# Patient Record
Sex: Female | Born: 1990 | Race: White | Hispanic: No | Marital: Single | Smoking: Current every day smoker
Health system: Southern US, Community
[De-identification: ages and names within clinical notes are randomized; demographics above are authoritative.]

## PROBLEM LIST (undated history)

## (undated) ENCOUNTER — Inpatient Hospital Stay (HOSPITAL_COMMUNITY): Payer: Self-pay

## (undated) DIAGNOSIS — N12 Tubulo-interstitial nephritis, not specified as acute or chronic: Secondary | ICD-10-CM

## (undated) DIAGNOSIS — Z87442 Personal history of urinary calculi: Secondary | ICD-10-CM

## (undated) DIAGNOSIS — N200 Calculus of kidney: Secondary | ICD-10-CM

## (undated) DIAGNOSIS — F329 Major depressive disorder, single episode, unspecified: Secondary | ICD-10-CM

## (undated) DIAGNOSIS — F32A Depression, unspecified: Secondary | ICD-10-CM

## (undated) DIAGNOSIS — F419 Anxiety disorder, unspecified: Secondary | ICD-10-CM

## (undated) DIAGNOSIS — T50901A Poisoning by unspecified drugs, medicaments and biological substances, accidental (unintentional), initial encounter: Secondary | ICD-10-CM

## (undated) HISTORY — PX: STENT PLACEMENT NON-VASCULAR (ARMC HX): HXRAD1736

## (undated) HISTORY — DX: Anxiety disorder, unspecified: F41.9

---
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
## 2010-05-31 ENCOUNTER — Inpatient Hospital Stay (HOSPITAL_COMMUNITY)
Admission: AD | Admit: 2010-05-31 | Discharge: 2010-05-31 | Disposition: A | Discharge status: Home / Self Care | Payer: 59 | Source: Ambulatory Visit | Attending: Obstetrics & Gynecology | Admitting: Obstetrics & Gynecology

## 2010-05-31 DIAGNOSIS — O4700 False labor before 37 completed weeks of gestation, unspecified trimester: Secondary | ICD-10-CM | POA: Insufficient documentation

## 2010-06-01 LAB — GC/CHLAMYDIA PROBE AMP, GENITAL: GC Probe Amp, Genital: NEGATIVE

## 2010-06-18 LAB — URINALYSIS, ROUTINE W REFLEX MICROSCOPIC
Glucose, UA: NEGATIVE mg/dL
Ketones, ur: 15 mg/dL — AB
Specific Gravity, Urine: 1.028 (ref 1.005–1.030)
pH: 5.5 (ref 5.0–8.0)

## 2010-06-18 LAB — URINE MICROSCOPIC-ADD ON

## 2010-06-18 LAB — GC/CHLAMYDIA PROBE AMP, GENITAL
Chlamydia, DNA Probe: NEGATIVE
GC Probe Amp, Genital: NEGATIVE

## 2010-06-18 LAB — URINE CULTURE: Culture: NO GROWTH

## 2010-06-18 LAB — WET PREP, GENITAL

## 2010-06-27 ENCOUNTER — Inpatient Hospital Stay (HOSPITAL_COMMUNITY)
Admission: AD | Admit: 2010-06-27 | Discharge: 2010-06-30 | DRG: 775 | Disposition: A | Discharge status: Home / Self Care | Payer: 59 | Source: Ambulatory Visit | Attending: Obstetrics & Gynecology | Admitting: Obstetrics & Gynecology

## 2010-06-27 LAB — CBC
Platelets: 136 10*3/uL — ABNORMAL LOW (ref 150–400)
RBC: 4.09 MIL/uL (ref 3.87–5.11)
RDW: 13.2 % (ref 11.5–15.5)

## 2010-06-27 LAB — COMPREHENSIVE METABOLIC PANEL
ALT: 31 U/L (ref 0–35)
Alkaline Phosphatase: 195 U/L — ABNORMAL HIGH (ref 39–117)
Calcium: 9.5 mg/dL (ref 8.4–10.5)
Chloride: 105 mEq/L (ref 96–112)
Total Bilirubin: 0.4 mg/dL (ref 0.3–1.2)

## 2010-06-27 LAB — URIC ACID: Uric Acid, Serum: 8.3 mg/dL — ABNORMAL HIGH (ref 2.4–7.0)

## 2010-06-28 LAB — RPR: RPR Ser Ql: NONREACTIVE

## 2010-06-28 LAB — ABO/RH: ABO/RH(D): O POS

## 2010-06-29 LAB — CBC
HCT: 34.3 % — ABNORMAL LOW (ref 36.0–46.0)
Hemoglobin: 11.3 g/dL — ABNORMAL LOW (ref 12.0–15.0)
MCHC: 32.9 g/dL (ref 30.0–36.0)
MCV: 94.5 fL (ref 78.0–100.0)
WBC: 12.2 10*3/uL — ABNORMAL HIGH (ref 4.0–10.5)

## 2013-04-08 ENCOUNTER — Encounter: Payer: Self-pay | Admitting: Emergency Medicine

## 2013-04-08 ENCOUNTER — Ambulatory Visit (INDEPENDENT_AMBULATORY_CARE_PROVIDER_SITE_OTHER): Payer: 59 | Admitting: Emergency Medicine

## 2013-04-08 VITALS — BP 112/82 | HR 118 | Temp 97.8°F | Resp 16 | Ht 62.5 in | Wt 102.0 lb

## 2013-04-08 DIAGNOSIS — R5383 Other fatigue: Principal | ICD-10-CM

## 2013-04-08 DIAGNOSIS — Z113 Encounter for screening for infections with a predominantly sexual mode of transmission: Secondary | ICD-10-CM

## 2013-04-08 DIAGNOSIS — N912 Amenorrhea, unspecified: Secondary | ICD-10-CM

## 2013-04-08 DIAGNOSIS — R5381 Other malaise: Secondary | ICD-10-CM

## 2013-04-08 DIAGNOSIS — R350 Frequency of micturition: Secondary | ICD-10-CM

## 2013-04-08 LAB — BASIC METABOLIC PANEL WITH GFR
BUN: 12 mg/dL (ref 6–23)
CALCIUM: 10 mg/dL (ref 8.4–10.5)
CO2: 26 meq/L (ref 19–32)
CREATININE: 0.86 mg/dL (ref 0.50–1.10)
Chloride: 102 mEq/L (ref 96–112)
Glucose, Bld: 69 mg/dL — ABNORMAL LOW (ref 70–99)
POTASSIUM: 3.8 meq/L (ref 3.5–5.3)
Sodium: 138 mEq/L (ref 135–145)

## 2013-04-08 LAB — CBC WITH DIFFERENTIAL/PLATELET
BASOS ABS: 0 10*3/uL (ref 0.0–0.1)
BASOS PCT: 1 % (ref 0–1)
EOS ABS: 0.1 10*3/uL (ref 0.0–0.7)
EOS PCT: 2 % (ref 0–5)
HEMATOCRIT: 41.7 % (ref 36.0–46.0)
Hemoglobin: 13.8 g/dL (ref 12.0–15.0)
LYMPHS ABS: 1.7 10*3/uL (ref 0.7–4.0)
LYMPHS PCT: 34 % (ref 12–46)
MCH: 29.9 pg (ref 26.0–34.0)
MCHC: 33.1 g/dL (ref 30.0–36.0)
MCV: 90.3 fL (ref 78.0–100.0)
MONO ABS: 0.4 10*3/uL (ref 0.1–1.0)
Monocytes Relative: 8 % (ref 3–12)
NEUTROS ABS: 2.7 10*3/uL (ref 1.7–7.7)
NEUTROS PCT: 55 % (ref 43–77)
PLATELETS: 180 10*3/uL (ref 150–400)
RBC: 4.62 MIL/uL (ref 3.87–5.11)
RDW: 13 % (ref 11.5–15.5)
WBC: 4.9 10*3/uL (ref 4.0–10.5)

## 2013-04-08 LAB — HEPATIC FUNCTION PANEL
ALBUMIN: 4.7 g/dL (ref 3.5–5.2)
ALK PHOS: 76 U/L (ref 39–117)
ALT: 12 U/L (ref 0–35)
AST: 14 U/L (ref 0–37)
BILIRUBIN INDIRECT: 0.4 mg/dL (ref 0.0–0.9)
Bilirubin, Direct: 0.1 mg/dL (ref 0.0–0.3)
Total Bilirubin: 0.5 mg/dL (ref 0.3–1.2)
Total Protein: 8.1 g/dL (ref 6.0–8.3)

## 2013-04-08 LAB — HIV ANTIBODY (ROUTINE TESTING W REFLEX): HIV: NONREACTIVE

## 2013-04-08 LAB — RPR

## 2013-04-08 LAB — HEPATITIS PANEL, ACUTE
HCV Ab: NEGATIVE
HEP B S AG: NEGATIVE
Hep A IgM: NONREACTIVE
Hep B C IgM: NONREACTIVE

## 2013-04-08 LAB — TSH: TSH: 1.929 u[IU]/mL (ref 0.350–4.500)

## 2013-04-08 LAB — MAGNESIUM: Magnesium: 2 mg/dL (ref 1.5–2.5)

## 2013-04-08 LAB — POCT URINE PREGNANCY: PREG TEST UR: NEGATIVE

## 2013-04-08 MED ORDER — CIPROFLOXACIN HCL 500 MG PO TABS: 500.0000 mg | ORAL_TABLET | Freq: Two times a day (BID) | ORAL | Status: AC

## 2013-04-08 NOTE — Patient Instructions (Signed)
Urinary Tract Infection °A urinary tract infection (UTI) can occur any place along the urinary tract. The tract includes the kidneys, ureters, bladder, and urethra. A type of germ called bacteria often causes a UTI. UTIs are often helped with antibiotic medicine.  °HOME CARE  °· If given, take antibiotics as told by your doctor. Finish them even if you start to feel better. °· Drink enough fluids to keep your pee (urine) clear or pale yellow. °· Avoid tea, drinks with caffeine, and bubbly (carbonated) drinks. °· Pee often. Avoid holding your pee in for a long time. °· Pee before and after having sex (intercourse). °· Wipe from front to back after you poop (bowel movement) if you are a woman. Use each tissue only once. °GET HELP RIGHT AWAY IF:  °· You have back pain. °· You have lower belly (abdominal) pain. °· You have chills. °· You feel sick to your stomach (nauseous). °· You throw up (vomit). °· Your burning or discomfort with peeing does not go away. °· You have a fever. °· Your symptoms are not better in 3 days. °MAKE SURE YOU:  °· Understand these instructions. °· Will watch your condition. °· Will get help right away if you are not doing well or get worse. °Document Released: 09/04/2007 Document Revised: 12/11/2011 Document Reviewed: 10/17/2011 °Exit[employer]® Patient Information ©2014 [employer]. ° °

## 2013-04-08 NOTE — Progress Notes (Signed)
   Subjective:    Patient ID: Patty Mata, female    DOB: 03/18/1991, 23 y.o.   MRN: [id number]  HPI Comments: 23 yo female has noticed unusual discharge in vaginal area on/ off for "a while". She thinks her last period was in April, she denies chance of pregnancy. She is not on birth control. She has noticed increased frequency with urine. Denies hematuria/ dysuria. She is not eating healthy or exercising routinely. She wants to get tested for STDs. She has had increased fatigue.  Current Outpatient Prescriptions on File Prior to Visit  Medication Sig Dispense Refill  . BIOTIN PO Take by mouth daily.      . medroxyPROGESTERone (DEPO-PROVERA) 150 MG/ML injection Inject 150 mg into the muscle every 3 (three) months.       No current facility-administered medications on file prior to visit.   ALLERGIES Penicillins  PMH Negative  Review of Systems  Constitutional: Positive for fatigue.  Genitourinary: Positive for vaginal discharge and menstrual problem.  All other systems reviewed and are negative.   BP 112/82  Pulse 118  Temp(Src) 97.8 F (36.6 C) (Temporal)  Resp 16  Ht 5' 2.5" (1.588 m)  Wt 102 lb (46.267 kg)  BMI 18.35 kg/m2     Objective:   Physical Exam  Nursing note and vitals reviewed. Constitutional: She is oriented to person, place, and time. She appears well-developed and well-nourished. No distress.  HENT:  Head: Normocephalic and atraumatic.  Right Ear: External ear normal.  Left Ear: External ear normal.  Nose: Nose normal.  Mouth/Throat: Oropharynx is clear and moist.  Eyes: Conjunctivae and EOM are normal.  Neck: Normal range of motion. Neck supple. No JVD present. No thyromegaly present.  Cardiovascular: Normal rate, regular rhythm, normal heart sounds and intact distal pulses.   Pulmonary/Chest: Effort normal and breath sounds normal.  Abdominal: Soft. Bowel sounds are normal. She exhibits no distension and no mass. There is no tenderness. There is no  rebound and no guarding.  Genitourinary:  Def to GYN  Musculoskeletal: Normal range of motion. She exhibits no edema and no tenderness.  Lymphadenopathy:    She has no cervical adenopathy.  Neurological: She is alert and oriented to person, place, and time. No cranial nerve deficit.  Skin: Skin is warm and dry. No rash noted. No erythema. No pallor.  Psychiatric: Her behavior is normal. Judgment and thought content normal.  Flat          Assessment & Plan:  1. Fatigue- check labs, increase activity and H2O 2. Urine frequency-Check labs, Hygiene explained, Cipro 500 mg AD 3. Amenorrhea/ std screening- CHeck labs, Advised she weill need full eval at GYN with Amenorrhea.

## 2013-04-09 LAB — URINALYSIS, MICROSCOPIC ONLY: Casts: NONE SEEN

## 2013-04-09 LAB — URINALYSIS, ROUTINE W REFLEX MICROSCOPIC
Glucose, UA: NEGATIVE mg/dL
HGB URINE DIPSTICK: NEGATIVE
KETONES UR: NEGATIVE mg/dL
NITRITE: NEGATIVE
PROTEIN: NEGATIVE mg/dL
SPECIFIC GRAVITY, URINE: 1.022 (ref 1.005–1.030)
UROBILINOGEN UA: 0.2 mg/dL (ref 0.0–1.0)
pH: 5.5 (ref 5.0–8.0)

## 2013-04-09 LAB — HSV(HERPES SIMPLEX VRS) I + II AB-IGG
HSV 1 Glycoprotein G Ab, IgG: 0.26 IV
HSV 2 GLYCOPROTEIN G AB, IGG: 0.12 IV

## 2013-04-09 LAB — VITAMIN D 25 HYDROXY (VIT D DEFICIENCY, FRACTURES): Vit D, 25-Hydroxy: 43 ng/mL (ref 30–89)

## 2013-04-09 LAB — URINE CULTURE
Colony Count: NO GROWTH
Organism ID, Bacteria: NO GROWTH

## 2013-04-09 LAB — GC PROBE AMPLIFICATION, URINE: GC PROBE AMP, URINE: NEGATIVE

## 2013-04-10 ENCOUNTER — Encounter: Payer: Self-pay | Admitting: Emergency Medicine

## 2013-04-12 ENCOUNTER — Telehealth: Payer: Self-pay | Admitting: *Deleted

## 2013-04-12 NOTE — Telephone Encounter (Signed)
Message copied by Laurence SpatesHELMER, Jasleen Riepe A on Mon Apr 12, 2013 12:32 PM ------      Message from: Berenice PrimasSMITH, MELISSA R      Created: Sat Apr 10, 2013  7:27 PM       All labs neg. But ? UTI with SX continue cipro AD. Advise she still needs GYN eval ------

## 2013-04-12 NOTE — Telephone Encounter (Signed)
Spoke with patient about recent lab results and instructions. 

## 2013-05-03 ENCOUNTER — Other Ambulatory Visit: Payer: Self-pay | Admitting: Emergency Medicine

## 2013-05-03 DIAGNOSIS — N390 Urinary tract infection, site not specified: Secondary | ICD-10-CM

## 2013-05-04 ENCOUNTER — Other Ambulatory Visit: Payer: Self-pay

## 2013-06-04 ENCOUNTER — Emergency Department (HOSPITAL_COMMUNITY): Payer: 59

## 2013-06-04 ENCOUNTER — Encounter (HOSPITAL_COMMUNITY): Payer: Self-pay | Admitting: Emergency Medicine

## 2013-06-04 ENCOUNTER — Emergency Department (HOSPITAL_COMMUNITY)
Admission: EM | Admit: 2013-06-04 | Discharge: 2013-06-04 | Disposition: A | Discharge status: Home / Self Care | Payer: 59 | Attending: Emergency Medicine | Admitting: Emergency Medicine

## 2013-06-04 DIAGNOSIS — R102 Pelvic and perineal pain: Secondary | ICD-10-CM

## 2013-06-04 DIAGNOSIS — F17200 Nicotine dependence, unspecified, uncomplicated: Secondary | ICD-10-CM | POA: Insufficient documentation

## 2013-06-04 DIAGNOSIS — B3731 Acute candidiasis of vulva and vagina: Secondary | ICD-10-CM | POA: Insufficient documentation

## 2013-06-04 DIAGNOSIS — K625 Hemorrhage of anus and rectum: Secondary | ICD-10-CM | POA: Insufficient documentation

## 2013-06-04 DIAGNOSIS — Z880 Allergy status to penicillin: Secondary | ICD-10-CM | POA: Insufficient documentation

## 2013-06-04 DIAGNOSIS — B373 Candidiasis of vulva and vagina: Secondary | ICD-10-CM

## 2013-06-04 LAB — WET PREP, GENITAL
CLUE CELLS WET PREP: NONE SEEN
Trich, Wet Prep: NONE SEEN

## 2013-06-04 LAB — URINALYSIS, ROUTINE W REFLEX MICROSCOPIC
BILIRUBIN URINE: NEGATIVE
Glucose, UA: NEGATIVE mg/dL
HGB URINE DIPSTICK: NEGATIVE
KETONES UR: NEGATIVE mg/dL
Leukocytes, UA: NEGATIVE
Nitrite: NEGATIVE
PH: 7 (ref 5.0–8.0)
Protein, ur: NEGATIVE mg/dL
SPECIFIC GRAVITY, URINE: 1.023 (ref 1.005–1.030)
Urobilinogen, UA: 1 mg/dL (ref 0.0–1.0)

## 2013-06-04 LAB — COMPREHENSIVE METABOLIC PANEL
ALK PHOS: 71 U/L (ref 39–117)
ALT: 12 U/L (ref 0–35)
AST: 14 U/L (ref 0–37)
Albumin: 4 g/dL (ref 3.5–5.2)
BUN: 14 mg/dL (ref 6–23)
CALCIUM: 9.5 mg/dL (ref 8.4–10.5)
CO2: 28 mEq/L (ref 19–32)
Chloride: 103 mEq/L (ref 96–112)
Creatinine, Ser: 0.88 mg/dL (ref 0.50–1.10)
GFR calc Af Amer: >90 mL/min (ref >90)
GFR calc non Af Amer: >90 mL/min (ref >90)
GLUCOSE: 72 mg/dL (ref 70–99)
POTASSIUM: 4 meq/L (ref 3.7–5.3)
SODIUM: 141 meq/L (ref 137–147)
Total Bilirubin: 0.5 mg/dL (ref 0.3–1.2)
Total Protein: 6.9 g/dL (ref 6.0–8.3)

## 2013-06-04 LAB — CBC WITH DIFFERENTIAL/PLATELET
Basophils Absolute: 0.1 10*3/uL (ref 0.0–0.1)
Basophils Relative: 1 % (ref 0–1)
EOS PCT: 1 % (ref 0–5)
Eosinophils Absolute: 0.1 10*3/uL (ref 0.0–0.7)
HEMATOCRIT: 40 % (ref 36.0–46.0)
HEMOGLOBIN: 13.2 g/dL (ref 12.0–15.0)
LYMPHS ABS: 2.4 10*3/uL (ref 0.7–4.0)
LYMPHS PCT: 36 % (ref 12–46)
MCH: 30.4 pg (ref 26.0–34.0)
MCHC: 33 g/dL (ref 30.0–36.0)
MCV: 92.2 fL (ref 78.0–100.0)
MONOS PCT: 8 % (ref 3–12)
Monocytes Absolute: 0.6 10*3/uL (ref 0.1–1.0)
Neutro Abs: 3.5 10*3/uL (ref 1.7–7.7)
Neutrophils Relative %: 53 % (ref 43–77)
Platelets: 160 10*3/uL (ref 150–400)
RBC: 4.34 MIL/uL (ref 3.87–5.11)
RDW: 12.8 % (ref 11.5–15.5)
WBC: 6.5 10*3/uL (ref 4.0–10.5)

## 2013-06-04 MED ORDER — OXYCODONE-ACETAMINOPHEN 5-325 MG PO TABS: 2.0000 | ORAL_TABLET | ORAL | Status: DC | PRN

## 2013-06-04 MED ORDER — FLUCONAZOLE 150 MG PO TABS
150.0000 mg | ORAL_TABLET | Freq: Once | ORAL | Status: AC
  Administered 2013-06-04: 150 mg via ORAL
  Filled 2013-06-04: qty 1

## 2013-06-04 MED ORDER — ONDANSETRON 8 MG PO TBDP
8.0000 mg | ORAL_TABLET | Freq: Once | ORAL | Status: AC
  Administered 2013-06-04: 8 mg via ORAL
  Filled 2013-06-04: qty 1

## 2013-06-04 MED ORDER — OXYCODONE-ACETAMINOPHEN 5-325 MG PO TABS
2.0000 | ORAL_TABLET | Freq: Once | ORAL | Status: AC
  Administered 2013-06-04: 2 via ORAL
  Filled 2013-06-04: qty 2

## 2013-06-04 NOTE — ED Notes (Signed)
Pt unable to urinate at this time. She is aware that we need a urine sample

## 2013-06-04 NOTE — ED Notes (Signed)
Pt has had pelvic performed by Dr Allen and wiFreida Busmantnessed by Woodlawn Parkarmen NT

## 2013-06-04 NOTE — ED Notes (Signed)
Pt. Made aware for the need of urine. 

## 2013-06-04 NOTE — ED Provider Notes (Signed)
CSN: [id number]     Arrival date & time 06/04/13  1800 History   First MD Initiated Contact with Patient 06/04/13 2035     Chief Complaint  Patient presents with  . Abdominal Pain  . Rectal Bleeding     (Consider location/radiation/quality/duration/timing/severity/associated sxs/prior Treatment) Patient is a 23 y.o. female presenting with abdominal pain and hematochezia. The history is provided by the patient.  Abdominal Pain Associated symptoms: hematochezia   Rectal Bleeding Associated symptoms: abdominal pain    patient here complaining of greater than 6 month history of left lower quadrant pain that became worse over the past 24 hours. Went to her GYN doctor and has followup scheduled for Monday but due to the increased sharp left lower quadrant pain she was told to come here for further evaluation. Patient denies any vaginal bleeding or discharge. Denies any dysuria or hematuria. No fever or chills. Symptoms worse with movement and better with rest. No treatment used prior to arrival. Pain has been constant for the past 6 months  History reviewed. No pertinent past medical history. History reviewed. No pertinent past surgical history. Family History  Problem Relation Age of Onset  . Depression Mother   . Arthritis Mother   . Asthma Father   . Mental illness Father    History  Substance Use Topics  . Smoking status: Current Every Day Smoker -- 0.50 packs/day for 5 years    Types: Cigarettes  . Smokeless tobacco: Never Used  . Alcohol Use: Yes     Comment: Socially   OB History   Grav Para Term Preterm Abortions TAB SAB Ect Mult Living                 Review of Systems  Gastrointestinal: Positive for abdominal pain and hematochezia.  All other systems reviewed and are negative.      Allergies  Penicillins  Home Medications   Current Outpatient Rx  Name  Route  Sig  Dispense  Refill  . acetaminophen (TYLENOL) 500 MG tablet   Oral   Take 1,000 mg by mouth  every 6 (six) hours as needed for mild pain.         Marland Kitchen ibuprofen (ADVIL,MOTRIN) 200 MG tablet   Oral   Take 800 mg by mouth every 6 (six) hours as needed for moderate pain.          BP 107/74  Pulse 106  Temp(Src) 97.7 F (36.5 C) (Oral)  Resp 16  SpO2 100% Physical Exam  Nursing note and vitals reviewed. Constitutional: She is oriented to person, place, and time. She appears well-developed and well-nourished.  Non-toxic appearance. No distress.  HENT:  Head: Normocephalic and atraumatic.  Eyes: Conjunctivae, EOM and lids are normal. Pupils are equal, round, and reactive to light.  Neck: Normal range of motion. Neck supple. No tracheal deviation present. No mass present.  Cardiovascular: Normal rate, regular rhythm and normal heart sounds.  Exam reveals no gallop.   No murmur heard. Pulmonary/Chest: Effort normal and breath sounds normal. No stridor. No respiratory distress. She has no decreased breath sounds. She has no wheezes. She has no rhonchi. She has no rales.  Abdominal: Soft. Normal appearance and bowel sounds are normal. She exhibits no distension. There is tenderness in the left lower quadrant. There is no rigidity, no rebound, no guarding and no CVA tenderness.    Genitourinary: Cervix exhibits no discharge and no friability. Left adnexum displays tenderness.  Musculoskeletal: Normal range of motion. She exhibits  no edema and no tenderness.  Neurological: She is alert and oriented to person, place, and time. She has normal strength. No cranial nerve deficit or sensory deficit. GCS eye subscore is 4. GCS verbal subscore is 5. GCS motor subscore is 6.  Skin: Skin is warm and dry. No abrasion and no rash noted.  Psychiatric: She has a normal mood and affect. Her speech is normal and behavior is normal.    ED Course  Procedures (including critical care time) Labs Review Labs Reviewed  WET PREP, GENITAL  GC/CHLAMYDIA PROBE AMP  CBC WITH DIFFERENTIAL  COMPREHENSIVE  METABOLIC PANEL  URINALYSIS, ROUTINE W REFLEX MICROSCOPIC  HIV ANTIBODY (ROUTINE TESTING)  POC URINE PREG, ED   Imaging Review No results found.   EKG Interpretation None      MDM   Final diagnoses:  None    Patient given Percocet for pain we'll be treated for her yeast infection    Toy BakerAnthony T Tian Davison, MD 06/04/13 2310

## 2013-06-04 NOTE — ED Notes (Addendum)
Pt reports left lower abdominal/pelvic pain which radiates towards her back, which began a few months ago. Pt reports nausea, emesis, diarrhea, and rectal bleeding. The patient states she has seen blood in the toilet and on toilet tissue paper. Pt reports being seen at her PCP earlier today, however states she can not tolerate the pain. Pt reports not having a period since 2012, she reports taking her last injection in February 2014. Pt reports feeling a mass in her left lower abdominal/pelvic area. Pt is A/O x4.

## 2013-06-04 NOTE — ED Notes (Signed)
Pelvic cart at bedside. 

## 2013-06-04 NOTE — Discharge Instructions (Signed)
Candidal Vulvovaginitis Candidal vulvovaginitis is an infection of the vagina and vulva. The vulva is the skin around the opening of the vagina. This may cause itching and discomfort in and around the vagina.  HOME CARE  Only take medicine as told by your doctor.  Do not have sex (intercourse) until the infection is healed or as told by your doctor.  Practice safe sex.  Tell your sex partner about your infection.  Do not douche or use tampons.  Wear cotton underwear. Do not wear tight pants or panty hose.  Eat yogurt. This may help treat and prevent yeast infections. GET HELP RIGHT AWAY IF:   You have a fever.  Your problems get worse during treatment or do not get better in 3 days.  You have discomfort, irritation, or itching in your vagina or vulva area.  You have pain after sex.  You start to get belly (abdominal) pain. MAKE SURE YOU:  Understand these instructions.  Will watch your condition.  Will get help right away if you are not doing well or get worse. Document Released: 06/14/2008 Document Revised: 06/10/2011 Document Reviewed: 06/14/2008 [facility] Patient Information 2014 [place], [place]. Pelvic Pain, Female Female pelvic pain can be caused by many different things and start from a variety of places. Pelvic pain refers to pain that is located in the lower half of the abdomen and between your hips. The pain may occur over a short period of time (acute) or may be reoccurring (chronic). The cause of pelvic pain may be related to disorders affecting the female reproductive organs (gynecologic), but it may also be related to the bladder, kidney stones, an intestinal complication, or muscle or skeletal problems. Getting help right away for pelvic pain is important, especially if there has been severe, sharp, or a sudden onset of unusual pain. It is also important to get help right away because some types of pelvic pain can be life threatening.  CAUSES  Below are only some  of the causes of pelvic pain. The causes of pelvic pain can be in one of several categories.   Gynecologic.  Pelvic inflammatory disease.  Sexually transmitted infection.  Ovarian cyst or a twisted ovarian ligament (ovarian torsion).  Uterine lining that grows outside the uterus (endometriosis).  Fibroids, cysts, or tumors.  Ovulation.  Pregnancy.  Pregnancy that occurs outside the uterus (ectopic pregnancy).  Miscarriage.  Labor.  Abruption of the placenta or ruptured uterus.  Infection.  Uterine infection (endometritis).  Bladder infection.  Diverticulitis.  Miscarriage related to a uterine infection (septic abortion).  Bladder.  Inflammation of the bladder (cystitis).  Kidney stone(s).  Gastrointenstinal.  Constipation.  Diverticulitis.  Neurologic.  Trauma.  Feeling pelvic pain because of mental or emotional causes (psychosomatic).  Cancers of the bowel or pelvis. EVALUATION  Your caregiver will want to take a careful history of your concerns. This includes recent changes in your health, a careful gynecologic history of your periods (menses), and a sexual history. Obtaining your family history and medical history is also important. Your caregiver may suggest a pelvic exam. A pelvic exam will help identify the location and severity of the pain. It also helps in the evaluation of which organ system may be involved. In order to identify the cause of the pelvic pain and be properly treated, your caregiver may order tests. These tests may include:   A pregnancy test.  Pelvic ultrasonography.  An X-ray exam of the abdomen.  A urinalysis or evaluation of vaginal discharge.  Blood tests. HOME CARE INSTRUCTIONS   Only take over-the-counter or prescription medicines for pain, discomfort, or fever as directed by your caregiver.   Rest as directed by your caregiver.   Eat a balanced diet.   Drink enough fluids to make your urine clear or pale  yellow, or as directed.   Avoid sexual intercourse if it causes pain.   Apply warm or cold compresses to the lower abdomen depending on which one helps the pain.   Avoid stressful situations.   Keep a journal of your pelvic pain. Write down when it started, where the pain is located, and if there are things that seem to be associated with the pain, such as food or your menstrual cycle.  Follow up with your caregiver as directed.  SEEK MEDICAL CARE IF:  Your medicine does not help your pain.  You have abnormal vaginal discharge. SEEK IMMEDIATE MEDICAL CARE IF:   You have heavy bleeding from the vagina.   Your pelvic pain increases.   You feel lightheaded or faint.   You have chills.   You have pain with urination or blood in your urine.   You have uncontrolled diarrhea or vomiting.   You have a fever or persistent symptoms for more than 3 days.  You have a fever and your symptoms suddenly get worse.   You are being physically or sexually abused.  MAKE SURE YOU:  Understand these instructions.  Will watch your condition.  Will get help if you are not doing well or get worse. Document Released: 02/13/2004 Document Revised: 09/17/2011 Document Reviewed: 07/08/2011 [facility] Patient Information 2014 [place].

## 2013-06-05 LAB — HIV ANTIBODY (ROUTINE TESTING W REFLEX): HIV: NONREACTIVE

## 2013-06-06 LAB — GC/CHLAMYDIA PROBE AMP
CT PROBE, AMP APTIMA: NEGATIVE
GC PROBE AMP APTIMA: NEGATIVE

## 2013-06-07 ENCOUNTER — Other Ambulatory Visit: Payer: Self-pay | Admitting: Plastic Surgery

## 2013-06-17 ENCOUNTER — Emergency Department (HOSPITAL_COMMUNITY): Payer: 59

## 2013-06-17 ENCOUNTER — Encounter (HOSPITAL_COMMUNITY): Payer: Self-pay | Admitting: Emergency Medicine

## 2013-06-17 ENCOUNTER — Emergency Department (HOSPITAL_COMMUNITY)
Admission: EM | Admit: 2013-06-17 | Discharge: 2013-06-17 | Disposition: A | Discharge status: Home / Self Care | Payer: 59 | Attending: Emergency Medicine | Admitting: Emergency Medicine

## 2013-06-17 DIAGNOSIS — F17200 Nicotine dependence, unspecified, uncomplicated: Secondary | ICD-10-CM | POA: Insufficient documentation

## 2013-06-17 DIAGNOSIS — K5900 Constipation, unspecified: Secondary | ICD-10-CM | POA: Insufficient documentation

## 2013-06-17 DIAGNOSIS — Z3202 Encounter for pregnancy test, result negative: Secondary | ICD-10-CM | POA: Insufficient documentation

## 2013-06-17 DIAGNOSIS — Z880 Allergy status to penicillin: Secondary | ICD-10-CM | POA: Insufficient documentation

## 2013-06-17 LAB — CBC WITH DIFFERENTIAL/PLATELET
BASOS ABS: 0 10*3/uL (ref 0.0–0.1)
BASOS PCT: 0 % (ref 0–1)
EOS ABS: 0 10*3/uL (ref 0.0–0.7)
Eosinophils Relative: 0 % (ref 0–5)
HCT: 42.9 % (ref 36.0–46.0)
Hemoglobin: 14.5 g/dL (ref 12.0–15.0)
Lymphocytes Relative: 23 % (ref 12–46)
Lymphs Abs: 2.1 10*3/uL (ref 0.7–4.0)
MCH: 30.9 pg (ref 26.0–34.0)
MCHC: 33.8 g/dL (ref 30.0–36.0)
MCV: 91.5 fL (ref 78.0–100.0)
MONOS PCT: 5 % (ref 3–12)
Monocytes Absolute: 0.5 10*3/uL (ref 0.1–1.0)
NEUTROS PCT: 72 % (ref 43–77)
Neutro Abs: 6.7 10*3/uL (ref 1.7–7.7)
PLATELETS: 168 10*3/uL (ref 150–400)
RBC: 4.69 MIL/uL (ref 3.87–5.11)
RDW: 13 % (ref 11.5–15.5)
WBC: 9.4 10*3/uL (ref 4.0–10.5)

## 2013-06-17 LAB — URINALYSIS, ROUTINE W REFLEX MICROSCOPIC
Bilirubin Urine: NEGATIVE
Glucose, UA: NEGATIVE mg/dL
HGB URINE DIPSTICK: NEGATIVE
Ketones, ur: NEGATIVE mg/dL
Leukocytes, UA: NEGATIVE
NITRITE: NEGATIVE
PROTEIN: NEGATIVE mg/dL
Specific Gravity, Urine: 1.023 (ref 1.005–1.030)
Urobilinogen, UA: 1 mg/dL (ref 0.0–1.0)
pH: 7 (ref 5.0–8.0)

## 2013-06-17 LAB — POC URINE PREG, ED: Preg Test, Ur: NEGATIVE

## 2013-06-17 LAB — BASIC METABOLIC PANEL
BUN: 14 mg/dL (ref 6–23)
CO2: 25 mEq/L (ref 19–32)
Calcium: 10 mg/dL (ref 8.4–10.5)
Chloride: 102 mEq/L (ref 96–112)
Creatinine, Ser: 0.72 mg/dL (ref 0.50–1.10)
GLUCOSE: 94 mg/dL (ref 70–99)
Potassium: 3.8 mEq/L (ref 3.7–5.3)
SODIUM: 142 meq/L (ref 137–147)

## 2013-06-17 NOTE — ED Provider Notes (Signed)
CSN: [id number]     Arrival date & time 06/17/13  1035 History   First MD Initiated Contact with Patient 06/17/13 1146     Chief Complaint  Patient presents with  . Constipation    HPI  The patient presents to the emergency room with complaints of constipation for the last week. Patient states she has not had a normal bowel movement in over one week.  She denies any abdominal pain. She has had some trouble with nausea and vomiting. She states she's had a couple of episodes each day. She also is having trouble where she does not feel like she can urinate properly. She's not having any pain with urination. She does not notice any blood. She denies fevers.  Patient saw her primary doctor yesterday and was told that she was constipated. Patient was told to take MiraLAX.  She did not take that medication. Patient presents to the emergency room bc she is still feeling constipated. History reviewed. No pertinent past medical history. History reviewed. No pertinent past surgical history. Family History  Problem Relation Age of Onset  . Depression Mother   . Arthritis Mother   . Asthma Father   . Mental illness Father    History  Substance Use Topics  . Smoking status: Current Every Day Smoker -- 0.50 packs/day for 5 years    Types: Cigarettes  . Smokeless tobacco: Never Used  . Alcohol Use: Yes     Comment: Socially   OB History   Grav Para Term Preterm Abortions TAB SAB Ect Mult Living                 Review of Systems  All other systems reviewed and are negative.      Allergies  Penicillins  Home Medications   Current Outpatient Rx  Name  Route  Sig  Dispense  Refill  . acetaminophen (TYLENOL) 500 MG tablet   Oral   Take 1,000 mg by mouth every 6 (six) hours as needed for headache.         . ibuprofen (ADVIL,MOTRIN) 200 MG tablet   Oral   Take 800 mg by mouth every 6 (six) hours as needed for moderate pain.         Marland Kitchen oxyCODONE-acetaminophen (PERCOCET/ROXICET)  5-325 MG per tablet   Oral   Take 2 tablets by mouth every 4 (four) hours as needed for severe pain.   10 tablet   0    BP 128/92  Pulse 102  Temp(Src) 98.2 F (36.8 C)  Resp 20  SpO2 99% Physical Exam  Nursing note and vitals reviewed. Constitutional: No distress.  Thin  HENT:  Head: Normocephalic and atraumatic.  Right Ear: External ear normal.  Left Ear: External ear normal.  Eyes: Conjunctivae are normal. Right eye exhibits no discharge. Left eye exhibits no discharge. No scleral icterus.  Neck: Neck supple. No tracheal deviation present.  Cardiovascular: Normal rate, regular rhythm and intact distal pulses.   Pulmonary/Chest: Effort normal and breath sounds normal. No stridor. No respiratory distress. She has no wheezes. She has no rales.  Abdominal: Soft. Bowel sounds are normal. She exhibits no distension. There is no tenderness. There is no rebound and no guarding.  Genitourinary: Rectal exam shows no mass, no tenderness and anal tone normal.  No fecal impaction  Musculoskeletal: She exhibits no edema and no tenderness.  Neurological: She is alert. She has normal strength. No cranial nerve deficit (no facial droop, extraocular movements intact, no slurred  speech) or sensory deficit. She exhibits normal muscle tone. She displays no seizure activity. Coordination normal.  Skin: Skin is warm and dry. No rash noted.  Psychiatric: She has a normal mood and affect.    ED Course  Procedures (including critical care time) Labs Review Labs Reviewed  URINALYSIS, ROUTINE W REFLEX MICROSCOPIC - Abnormal; Notable for the following:    APPearance CLOUDY (*)    All other components within normal limits  CBC WITH DIFFERENTIAL  BASIC METABOLIC PANEL  POC URINE PREG, ED   Imaging Review Dg Abd Acute W/chest  06/17/2013   CLINICAL DATA:  Chest lower abdominal pain  EXAM: ACUTE ABDOMEN SERIES (ABDOMEN 2 VIEW & CHEST 1 VIEW)  COMPARISON:  None.  FINDINGS: There is no evidence of  dilated bowel loops or free intraperitoneal air. No radiopaque calculi or other significant radiographic abnormality is seen. Heart size and mediastinal contours are within normal limits. Both lungs are clear.  IMPRESSION: No acute abnormality noted.   Electronically Signed   By: Alcide CleverMark  Lukens M.D.   On: 06/17/2013 12:43    MDM   Final diagnoses:  Constipation   X-rays did not suggest a bowel obstruction. Patient is not having any abdominal tenderness. Patient was prescribed laxatives by her doctor but she did not start taking them yet. I doubt appendicitis or other acute infection.  Regarding her urinary symptoms, the patient does not appear to be urinary tract infection. Bladder scan was performed and she is not obstructed.  If her symptoms persist she might warrant evaluation by urologist to evaluate for further etiology such as interstitial cystitis    Celene KrasJon R Kiely Cousar, MD 06/17/13 1453

## 2013-06-17 NOTE — Discharge Instructions (Signed)
Constipation, Adult  Constipation is when a person:  · Poops (bowel movement) less than 3 times a week.  · Has a hard time pooping.  · Has poop that is dry, hard, or bigger than normal.  HOME CARE   · Eat more fiber, such as fruits, vegetables, whole grains like brown rice, and beans.  · Eat less fatty foods and sugar. This includes French fries, hamburgers, cookies, candy, and soda.  · If you are not getting enough fiber from food, take products with added fiber in them (supplements).  · Drink enough fluid to keep your pee (urine) clear or pale yellow.  · Go to the restroom when you feel like you need to poop. Do not hold it.  · Only take medicine as told by your doctor. Do not take medicines that help you poop (laxatives) without talking to your doctor first.  · Exercise on a regular basis, or as told by your doctor.  GET HELP RIGHT AWAY IF:   · You have bright red blood in your poop (stool).  · Your constipation lasts more than 4 days or gets worse.  · You have belly (abdomen) or butt (rectal) pain.  · You have thin poop (as thin as a pencil).  · You lose weight, and it cannot be explained.  MAKE SURE YOU:   · Understand these instructions.  · Will watch your condition.  · Will get help right away if you are not doing well or get worse.  Document Released: 09/04/2007 Document Revised: 06/10/2011 Document Reviewed: 12/28/2012  [employer]® Patient Information ©2014 [employer].

## 2013-06-17 NOTE — ED Notes (Signed)
Pt c/o constipation; saw pcp yesterday--told to use miralax but pt did not; states can't urinate because she can't have a bowel movement; c/o rib pain and leg pain

## 2013-06-24 ENCOUNTER — Encounter: Payer: Self-pay | Admitting: Internal Medicine

## 2013-06-24 ENCOUNTER — Ambulatory Visit (INDEPENDENT_AMBULATORY_CARE_PROVIDER_SITE_OTHER): Payer: 59 | Admitting: Internal Medicine

## 2013-06-24 VITALS — BP 106/74 | HR 92 | Temp 98.1°F | Resp 16 | Ht 62.5 in | Wt 98.8 lb

## 2013-06-24 DIAGNOSIS — J040 Acute laryngitis: Secondary | ICD-10-CM

## 2013-06-24 DIAGNOSIS — J019 Acute sinusitis, unspecified: Secondary | ICD-10-CM

## 2013-06-24 MED ORDER — AZITHROMYCIN 250 MG PO TABS: ORAL_TABLET | ORAL | Status: DC

## 2013-06-24 MED ORDER — HYDROCODONE-ACETAMINOPHEN 5-325 MG PO TABS: ORAL_TABLET | ORAL | Status: DC

## 2013-06-24 MED ORDER — PREDNISONE 20 MG PO TABS: 20.0000 mg | ORAL_TABLET | ORAL | Status: DC

## 2013-06-24 NOTE — Patient Instructions (Signed)
Sinusitis Sinusitis is redness, soreness, and swelling (inflammation) of the paranasal sinuses. Paranasal sinuses are air pockets within the bones of your face (beneath the eyes, the middle of the forehead, or above the eyes). In healthy paranasal sinuses, mucus is able to drain out, and air is able to circulate through them by way of your nose. However, when your paranasal sinuses are inflamed, mucus and air can become trapped. This can allow bacteria and other germs to grow and cause infection. Sinusitis can develop quickly and last only a short time (acute) or continue over a long period (chronic). Sinusitis that lasts for more than 12 weeks is considered chronic.  CAUSES  Causes of sinusitis include:  Allergies.  Structural abnormalities, such as displacement of the cartilage that separates your nostrils (deviated septum), which can decrease the air flow through your nose and sinuses and affect sinus drainage.  Functional abnormalities, such as when the small hairs (cilia) that line your sinuses and help remove mucus do not work properly or are not present. SYMPTOMS  Symptoms of acute and chronic sinusitis are the same. The primary symptoms are pain and pressure around the affected sinuses. Other symptoms include:  Upper toothache.  Earache.  Headache.  Bad breath.  Decreased sense of smell and taste.  A cough, which worsens when you are lying flat.  Fatigue.  Fever.  Thick drainage from your nose, which often is green and may contain pus (purulent).  Swelling and warmth over the affected sinuses. DIAGNOSIS  Your caregiver will perform a physical exam. During the exam, your caregiver may:  Look in your nose for signs of abnormal growths in your nostrils (nasal polyps).  Tap over the affected sinus to check for signs of infection.  View the inside of your sinuses (endoscopy) with a special imaging device with a light attached (endoscope), which is inserted into your  sinuses. If your caregiver suspects that you have chronic sinusitis, one or more of the following tests may be recommended:  Allergy tests.  Nasal culture A sample of mucus is taken from your nose and sent to a lab and screened for bacteria.  Nasal cytology A sample of mucus is taken from your nose and examined by your caregiver to determine if your sinusitis is related to an allergy. TREATMENT  Most cases of acute sinusitis are related to a viral infection and will resolve on their own within 10 days. Sometimes medicines are prescribed to help relieve symptoms (pain medicine, decongestants, nasal steroid sprays, or saline sprays).  However, for sinusitis related to a bacterial infection, your caregiver will prescribe antibiotic medicines. These are medicines that will help kill the bacteria causing the infection.  Rarely, sinusitis is caused by a fungal infection. In theses cases, your caregiver will prescribe antifungal medicine. For some cases of chronic sinusitis, surgery is needed. Generally, these are cases in which sinusitis recurs more than 3 times per year, despite other treatments. HOME CARE INSTRUCTIONS   Drink plenty of water. Water helps thin the mucus so your sinuses can drain more easily.  Use a humidifier.  Inhale steam 3 to 4 times a day (for example, sit in the bathroom with the shower running).  Apply a warm, moist washcloth to your face 3 to 4 times a day, or as directed by your caregiver.  Use saline nasal sprays to help moisten and clean your sinuses.  Take over-the-counter or prescription medicines for pain, discomfort, or fever only as directed by your caregiver. SEEK IMMEDIATE MEDICAL   CARE IF:  You have increasing pain or severe headaches.  You have nausea, vomiting, or drowsiness.  You have swelling around your face.  You have vision problems.  You have a stiff neck.  You have difficulty breathing. MAKE SURE YOU:   Understand these  instructions.  Will watch your condition.  Will get help right away if you are not doing well or get worse. Document Released: 03/18/2005 Document Revised: 06/10/2011 Document Reviewed: 04/02/2011 [facility] Patient Information 2014 [place], [place]. Laryngitis At the top of your windpipe is your voice box. It is the source of your voice. Inside your voice box are 2 bands of muscles called vocal cords. When you breathe, your vocal cords are relaxed and open so that air can get into the lungs. When you decide to say something, these cords come together and vibrate. The sound from these vibrations goes into your throat and comes out through your mouth as sound. Laryngitis is an inflammation of the vocal cords that causes hoarseness, cough, loss of voice, sore throat, and dry throat. Laryngitis can be temporary (acute) or long-term (chronic). Most cases of acute laryngitis improve with time.Chronic laryngitis lasts for more than 3 weeks. CAUSES Laryngitis can often be related to excessive smoking, talking, or yelling, as well as inhalation of toxic fumes and allergies. Acute laryngitis is usually caused by a viral infection, vocal strain, measles or mumps, or bacterial infections. Chronic laryngitis is usually caused by vocal cord strain, vocal cord injury, postnasal drip, growths on the vocal cords, or acid reflux. SYMPTOMS   Cough.  Sore throat.  Dry throat. RISK FACTORS  Respiratory infections.  Exposure to irritating substances, such as cigarette smoke, excessive amounts of alcohol, stomach acids, and workplace chemicals.  Voice trauma, such as vocal cord injury from shouting or speaking too loud. DIAGNOSIS  Your cargiver will perform a physical exam. During the physical exam, your caregiver will examine your throat. The most common sign of laryngitis is hoarseness. Laryngoscopy may be necessary to confirm the diagnosis of this condition. This procedure allows your caregiver to look into  the larynx. HOME CARE INSTRUCTIONS  Drink enough fluids to keep your urine clear or pale yellow.  Rest until you no longer have symptoms or as directed by your caregiver.  Breathe in moist air.  Take all medicine as directed by your caregiver.  Do not smoke.  Talk as little as possible (this includes whispering).  Write on paper instead of talking until your voice is back to normal.  Follow up with your caregiver if your condition has not improved after 10 days. SEEK MEDICAL CARE IF:   You have trouble breathing.  You cough up blood.  You have persistent fever.  You have increasing pain.  You have difficulty swallowing. MAKE SURE YOU:  Understand these instructions.  Will watch your condition.  Will get help right away if you are not doing well or get worse. Document Released: 03/18/2005 Document Revised: 06/10/2011 Document Reviewed: 05/24/2010 [facility] Patient Information 2014 [place], [place].    Migraine Headache A migraine headache is an intense, throbbing pain on one or both sides of your head. A migraine can last for 30 minutes to several hours. CAUSES  The exact cause of a migraine headache is not always known. However, a migraine may be caused when nerves in the brain become irritated and release chemicals that cause inflammation. This causes pain. Certain things may also trigger migraines, such as:  Alcohol.  Smoking.  Stress.  Menstruation.  Aged cheeses.  Foods or drinks that contain nitrates, glutamate, aspartame, or tyramine.  Lack of sleep.  Chocolate.  Caffeine.  Hunger.  Physical exertion.  Fatigue.  Medicines used to treat chest pain (nitroglycerine), birth control pills, estrogen, and some blood pressure medicines. SIGNS AND SYMPTOMS  Pain on one or both sides of your head.  Pulsating or throbbing pain.  Severe pain that prevents daily activities.  Pain that is aggravated by any physical activity.  Nausea, vomiting,  or both.  Dizziness.  Pain with exposure to bright lights, loud noises, or activity.  General sensitivity to bright lights, loud noises, or smells. Before you get a migraine, you may get warning signs that a migraine is coming (aura). An aura may include:  Seeing flashing lights.  Seeing bright spots, halos, or zig-zag lines.  Having tunnel vision or blurred vision.  Having feelings of numbness or tingling.  Having trouble talking.  Having muscle weakness. DIAGNOSIS  A migraine headache is often diagnosed based on:  Symptoms.  Physical exam.  A CT scan or MRI of your head. These imaging tests cannot diagnose migraines, but they can help rule out other causes of headaches. TREATMENT Medicines may be given for pain and nausea. Medicines can also be given to help prevent recurrent migraines.  HOME CARE INSTRUCTIONS  Only take over-the-counter or prescription medicines for pain or discomfort as directed by your health care provider. The use of long-term narcotics is not recommended.  Lie down in a dark, quiet room when you have a migraine.  Keep a journal to find out what may trigger your migraine headaches. For example, write down:  What you eat and drink.  How much sleep you get.  Any change to your diet or medicines.  Limit alcohol consumption.  Quit smoking if you smoke.  Get 7 9 hours of sleep, or as recommended by your health care provider.  Limit stress.  Keep lights dim if bright lights bother you and make your migraines worse. SEEK IMMEDIATE MEDICAL CARE IF:   Your migraine becomes severe.  You have a fever.  You have a stiff neck.  You have vision loss.  You have muscular weakness or loss of muscle control.  You start losing your balance or have trouble walking.  You feel faint or pass out.  You have severe symptoms that are different from your first symptoms. MAKE SURE YOU:   Understand these instructions.  Will watch your  condition.  Will get help right away if you are not doing well or get worse. Document Released: 03/18/2005 Document Revised: 01/06/2013 Document Reviewed: 11/23/2012 [facility] Patient Information 2014 [place].

## 2013-06-24 NOTE — Progress Notes (Signed)
Subjective:    Patient ID: Patty Mata, female    DOB: May 30, 1990, 23 y.o.   MRN: [id number]   Patient was dent home from work today due to her laryngitic voice.  Sinusitis This is a new problem. The current episode started in the past 7 days. The problem has been gradually worsening since onset. There has been no fever. The pain is mild. Associated symptoms include chills, congestion, coughing, headaches, a hoarse voice, sinus pressure and a sore throat. Pertinent negatives include no diaphoresis, ear pain, neck pain, shortness of breath, sneezing or swollen glands. Past treatments include nothing.  Headache  This is a chronic (pain over frontal and maxillary areas ) problem. The current episode started in the past 7 days. The problem occurs intermittently. The problem has been waxing and waning. The pain is located in the frontal region. The pain does not radiate. The pain quality is similar to prior headaches. The quality of the pain is described as aching and throbbing. The pain is at a severity of 5/10. The pain is moderate. Associated symptoms include coughing, muscle aches, nausea, phonophobia, photophobia, sinus pressure and a sore throat. Pertinent negatives include no abdominal pain, blurred vision, drainage, ear pain, eye pain, eye redness, fever, neck pain, numbness, swollen glands, tingling, visual change, vomiting or weakness. The symptoms are aggravated by bright light, noise and menstrual cycle. She has tried Excedrin for the symptoms. The treatment provided mild relief. Her past medical history is significant for migraine headaches and sinus disease. There is no history of hypertension.  Sore Throat  This is a new problem. The current episode started in the past 7 days. The problem has been waxing and waning. Neither side of throat is experiencing more pain than the other. There has been no fever. The pain is at a severity of 3/10. The pain is mild. Associated symptoms include  congestion, coughing, headaches and a hoarse voice. Pertinent negatives include no abdominal pain, ear discharge, ear pain, neck pain, shortness of breath, swollen glands or vomiting.   Current Outpatient Prescriptions on File Prior to Visit  Medication Sig Dispense Refill  . acetaminophen (TYLENOL) 500 MG tablet Take 1,000 mg by mouth every 6 (six) hours as needed for headache.      . ibuprofen (ADVIL,MOTRIN) 200 MG tablet Take 800 mg by mouth every 6 (six) hours as needed for moderate pain.       No current facility-administered medications on file prior to visit.   Allergies  Allergen Reactions  . Penicillins Rash   Review of Systems  Constitutional: Positive for chills. Negative for fever and diaphoresis.  HENT: Positive for congestion, hoarse voice, sinus pressure and sore throat. Negative for dental problem, ear discharge, ear pain, facial swelling, postnasal drip and sneezing.   Eyes: Positive for photophobia. Negative for blurred vision, pain and redness.  Respiratory: Positive for cough. Negative for shortness of breath.   Cardiovascular: Negative.   Gastrointestinal: Positive for nausea. Negative for vomiting and abdominal pain.  Musculoskeletal: Negative.  Negative for neck pain.  Skin: Negative for color change, pallor and rash.  Neurological: Positive for headaches. Negative for tingling, weakness and numbness.    BP 106/74  Pulse 92  Temp 98.1 F   Resp 16  Ht 5' 2.5"  Wt 98 lb 12.8 oz   BMI 17.77 kg/m2   Objective:     Physical Exam  Constitutional: She is oriented to person, place, and time. No distress.  HENT:  Mouth/Throat: No  oropharyngeal exudate.  Bilat Frontal and Maxillary tenderness. Oropharynx 2+ injected w/o exudate  Eyes: Conjunctivae are normal. Pupils are equal, round, and reactive to light. Right eye exhibits no discharge. Left eye exhibits no discharge.  Neck: Neck supple. No JVD present. No thyromegaly present.  Cardiovascular: Normal rate,  regular rhythm and normal heart sounds.   No murmur heard. Pulmonary/Chest: Effort normal and breath sounds normal.  Moderate hoarseness with decreased volume  Musculoskeletal: Normal range of motion.  Lymphadenopathy:    She has no cervical adenopathy.  Neurological: She is alert and oriented to person, place, and time. No cranial nerve deficit.  Skin: Skin is warm and dry. No rash noted. She is not diaphoretic. No erythema. No pallor.   Assessment & Plan:   1. Acute sinusitis, unspecified - Rx Z pak & 1 RF, Pred 20 mg #20 pulse/taper, Norco 5 for cough and pain  2. Laryngitis  Recc OOW today & tomorrow.  Also recc ROV ~ 2-3 weeks to discuss HA and ascertain if having premorbid vascular or Muscle Contraction HA's.

## 2013-07-09 ENCOUNTER — Ambulatory Visit: Payer: Self-pay | Admitting: Emergency Medicine

## 2013-07-11 ENCOUNTER — Emergency Department (HOSPITAL_COMMUNITY): Payer: 59

## 2013-07-11 ENCOUNTER — Emergency Department (HOSPITAL_COMMUNITY)
Admission: EM | Admit: 2013-07-11 | Discharge: 2013-07-11 | Disposition: A | Discharge status: Home / Self Care | Payer: 59 | Attending: Emergency Medicine | Admitting: Emergency Medicine

## 2013-07-11 DIAGNOSIS — Z79899 Other long term (current) drug therapy: Secondary | ICD-10-CM | POA: Insufficient documentation

## 2013-07-11 DIAGNOSIS — M542 Cervicalgia: Secondary | ICD-10-CM | POA: Insufficient documentation

## 2013-07-11 DIAGNOSIS — Z880 Allergy status to penicillin: Secondary | ICD-10-CM | POA: Insufficient documentation

## 2013-07-11 DIAGNOSIS — M545 Low back pain, unspecified: Secondary | ICD-10-CM

## 2013-07-11 DIAGNOSIS — H5310 Unspecified subjective visual disturbances: Secondary | ICD-10-CM | POA: Insufficient documentation

## 2013-07-11 DIAGNOSIS — Y9389 Activity, other specified: Secondary | ICD-10-CM | POA: Insufficient documentation

## 2013-07-11 DIAGNOSIS — F17200 Nicotine dependence, unspecified, uncomplicated: Secondary | ICD-10-CM | POA: Insufficient documentation

## 2013-07-11 DIAGNOSIS — Y92410 Unspecified street and highway as the place of occurrence of the external cause: Secondary | ICD-10-CM | POA: Insufficient documentation

## 2013-07-11 MED ORDER — METHOCARBAMOL 500 MG PO TABS: 500.0000 mg | ORAL_TABLET | Freq: Two times a day (BID) | ORAL | Status: DC

## 2013-07-11 MED ORDER — OXYCODONE-ACETAMINOPHEN 5-325 MG PO TABS
2.0000 | ORAL_TABLET | Freq: Once | ORAL | Status: AC
  Administered 2013-07-11: 2 via ORAL
  Filled 2013-07-11: qty 2

## 2013-07-11 MED ORDER — OXYCODONE-ACETAMINOPHEN 5-325 MG PO TABS: 1.0000 | ORAL_TABLET | Freq: Four times a day (QID) | ORAL | Status: DC | PRN

## 2013-07-11 MED ORDER — HYDROCODONE-ACETAMINOPHEN 5-325 MG PO TABS
2.0000 | ORAL_TABLET | Freq: Once | ORAL | Status: DC
  Filled 2013-07-11: qty 2

## 2013-07-11 MED ORDER — MELOXICAM 7.5 MG PO TABS: 15.0000 mg | ORAL_TABLET | Freq: Every day | ORAL | Status: DC
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
## 2013-07-11 NOTE — Discharge Instructions (Signed)
Recommend that you refrain from strenuous activity or heavy lifting. Do not drive a vehicle or operate heavy machinery for one week. Take Mobic and Robaxin as prescribed for symptoms. You may take Percocet as prescribed for breakthrough pain control. Recommend you follow up with your primary doctor in one week for head injury clearance. Followup with an orthopedist as needed if pain persists. Return if symptoms worsen such as if you develop loss of consciousness, vomiting, weakness on one side of your body, or loss or bowel/bladder function.  Head Injury, Adult You have received a head injury. It does not appear serious at this time. Headaches and vomiting are common following head injury. It should be easy to awaken from sleeping. Sometimes it is necessary for you to stay in the emergency department for a while for observation. Sometimes admission to the hospital may be needed. After injuries such as yours, most problems occur within the first 24 hours, but side effects may occur up to 7 10 days after the injury. It is important for you to carefully monitor your condition and contact your health care provider or seek immediate medical care if there is a change in your condition. WHAT ARE THE TYPES OF HEAD INJURIES? Head injuries can be as minor as a bump. Some head injuries can be more severe. More severe head injuries include:  A jarring injury to the brain (concussion).  A bruise of the brain (contusion). This mean there is bleeding in the brain that can cause swelling.  A cracked skull (skull fracture).  Bleeding in the brain that collects, clots, and forms a bump (hematoma). WHAT CAUSES A HEAD INJURY? A serious head injury is most likely to happen to someone who is in a car wreck and is not wearing a seat belt. Other causes of major head injuries include bicycle or motorcycle accidents, sports injuries, and falls. HOW ARE HEAD INJURIES DIAGNOSED? A complete history of the event leading to the  injury and your current symptoms will be helpful in diagnosing head injuries. Many times, pictures of the brain, such as CT or MRI are needed to see the extent of the injury. Often, an overnight hospital stay is necessary for observation.  WHEN SHOULD I SEEK IMMEDIATE MEDICAL CARE?  You should get help right away if:  You have confusion or drowsiness.  You feel sick to your stomach (nauseous) or have continued, forceful vomiting.  You have dizziness or unsteadiness that is getting worse.  You have severe, continued headaches not relieved by medicine. Only take over-the-counter or prescription medicines for pain, fever, or discomfort as directed by your health care provider.  You do not have normal function of the arms or legs or are unable to walk.  You notice changes in the black spots in the center of the colored part of your eye (pupil).  You have a clear or bloody fluid coming from your nose or ears.  You have a loss of vision. During the next 24 hours after the injury, you must stay with someone who can watch you for the warning signs. This person should contact local emergency services (911 in the [place] if you have seizures, you become unconscious, or you are unable to wake up. HOW CAN I PREVENT A HEAD INJURY IN THE FUTURE? The most important factor for preventing major head injuries is avoiding motor vehicle accidents. To minimize the potential for damage to your head, it is crucial to wear seat belts while riding in motor vehicles. Wearing helmets  while bike riding and playing collision sports (like football) is also helpful. Also, avoiding dangerous activities around the house will further help reduce your risk of head injury.  WHEN CAN I RETURN TO NORMAL ACTIVITIES AND ATHLETICS? You should be reevaluated by your health care provider before returning to these activities. If you have any of the following symptoms, you should not return to activities or contact sports until 1 week  after the symptoms have stopped:  Persistent headache.  Dizziness or vertigo.  Poor attention and concentration.  Confusion.  Memory problems.  Nausea or vomiting.  Fatigue or tire easily.  Irritability.  Intolerant of bright lights or loud noises.  Anxiety or depression.  Disturbed sleep. MAKE SURE YOU:   Understand these instructions.  Will watch your condition.  Will get help right away if you are not doing well or get worse. Document Released: 03/18/2005 Document Revised: 01/06/2013 Document Reviewed: 11/23/2012 [facility] Patient Information 2014 [place], [place]. Back Pain, Adult Back pain is very common. The pain often gets better over time. The cause of back pain is usually not dangerous. Most people can learn to manage their back pain on their own.  HOME CARE   Stay active. Start with short walks on flat ground if you can. Try to walk farther each day.  Do not sit, drive, or stand in one place for more than 30 minutes. Do not stay in bed.  Do not avoid exercise or work. Activity can help your back heal faster.  Be careful when you bend or lift an object. Bend at your knees, keep the object close to you, and do not twist.  Sleep on a firm mattress. Lie on your side, and bend your knees. If you lie on your back, put a pillow under your knees.  Only take medicines as told by your doctor.  Put ice on the injured area.  Put ice in a plastic bag.  Place a towel between your skin and the bag.  Leave the ice on for 15-20 minutes, 03-04 times a day for the first 2 to 3 days. After that, you can switch between ice and heat packs.  Ask your doctor about back exercises or massage.  Avoid feeling anxious or stressed. Find good ways to deal with stress, such as exercise. GET HELP RIGHT AWAY IF:   Your pain does not go away with rest or medicine.  Your pain does not go away in 1 week.  You have new problems.  You do not feel well.  The pain spreads into  your legs.  You cannot control when you poop (bowel movement) or pee (urinate).  Your arms or legs feel weak or lose feeling (numbness).  You feel sick to your stomach (nauseous) or throw up (vomit).  You have belly (abdominal) pain.  You feel like you may pass out (faint). MAKE SURE YOU:   Understand these instructions.  Will watch your condition.  Will get help right away if you are not doing well or get worse. Document Released: 09/04/2007 Document Revised: 06/10/2011 Document Reviewed: 08/06/2010 [facility] Patient Information 2014 [place], [place]. Muscle Strain A muscle strain is an injury that occurs when a muscle is stretched beyond its normal length. Usually a small number of muscle fibers are torn when this happens. Muscle strain is rated in degrees. First-degree strains have the least amount of muscle fiber tearing and pain. Second-degree and third-degree strains have increasingly more tearing and pain.  Usually, recovery from muscle strain takes 1  2 weeks. Complete healing takes 5 6 weeks.  CAUSES  Muscle strain happens when a sudden, violent force placed on a muscle stretches it too far. This may occur with lifting, sports, or a fall.  RISK FACTORS Muscle strain is especially common in athletes.  SIGNS AND SYMPTOMS At the site of the muscle strain, there may be:  Pain.  Bruising.  Swelling.  Difficulty using the muscle due to pain or lack of normal function. DIAGNOSIS  Your health care provider will perform a physical exam and ask about your medical history. TREATMENT  Often, the best treatment for a muscle strain is resting, icing, and applying cold compresses to the injured area.  HOME CARE INSTRUCTIONS   Use the PRICE method of treatment to promote muscle healing during the first 2 3 days after your injury. The PRICE method involves:  Protecting the muscle from being injured again.  Restricting your activity and resting the injured body part.  Icing your  injury. To do this, put ice in a plastic bag. Place a towel between your skin and the bag. Then, apply the ice and leave it on from 15 20 minutes each hour. After the third day, switch to moist heat packs.  Apply compression to the injured area with a splint or elastic bandage. Be careful not to wrap it too tightly. This may interfere with blood circulation or increase swelling.  Elevate the injured body part above the level of your heart as often as you can.  Only take over-the-counter or prescription medicines for pain, discomfort, or fever as directed by your health care provider.  Warming up prior to exercise helps to prevent future muscle strains. SEEK MEDICAL CARE IF:   You have increasing pain or swelling in the injured area.  You have numbness, tingling, or a significant loss of strength in the injured area. MAKE SURE YOU:   Understand these instructions.  Will watch your condition.  Will get help right away if you are not doing well or get worse. Document Released: 03/18/2005 Document Revised: 01/06/2013 Document Reviewed: 10/15/2012 [facility] Patient Information 2014 [place], [place].
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
## 2013-07-12 ENCOUNTER — Ambulatory Visit: Payer: Self-pay | Admitting: Emergency Medicine

## 2013-07-12 ENCOUNTER — Encounter: Payer: 59 | Admitting: Internal Medicine

## 2013-07-12 DIAGNOSIS — Z0000 Encounter for general adult medical examination without abnormal findings: Secondary | ICD-10-CM

## 2013-07-13 ENCOUNTER — Encounter (HOSPITAL_COMMUNITY): Payer: Self-pay | Admitting: Emergency Medicine

## 2013-07-13 ENCOUNTER — Emergency Department (HOSPITAL_COMMUNITY)
Admission: EM | Admit: 2013-07-13 | Discharge: 2013-07-13 | Disposition: A | Discharge status: Home / Self Care | Payer: 59 | Attending: Emergency Medicine | Admitting: Emergency Medicine

## 2013-07-13 ENCOUNTER — Emergency Department (HOSPITAL_COMMUNITY): Payer: 59

## 2013-07-13 DIAGNOSIS — R51 Headache: Secondary | ICD-10-CM | POA: Insufficient documentation

## 2013-07-13 DIAGNOSIS — M542 Cervicalgia: Secondary | ICD-10-CM | POA: Insufficient documentation

## 2013-07-13 DIAGNOSIS — S161XXA Strain of muscle, fascia and tendon at neck level, initial encounter: Secondary | ICD-10-CM

## 2013-07-13 DIAGNOSIS — G8911 Acute pain due to trauma: Secondary | ICD-10-CM | POA: Insufficient documentation

## 2013-07-13 DIAGNOSIS — Z880 Allergy status to penicillin: Secondary | ICD-10-CM | POA: Insufficient documentation

## 2013-07-13 DIAGNOSIS — Z79899 Other long term (current) drug therapy: Secondary | ICD-10-CM | POA: Insufficient documentation

## 2013-07-13 DIAGNOSIS — Z791 Long term (current) use of non-steroidal anti-inflammatories (NSAID): Secondary | ICD-10-CM | POA: Insufficient documentation

## 2013-07-13 DIAGNOSIS — M545 Low back pain, unspecified: Secondary | ICD-10-CM | POA: Insufficient documentation

## 2013-07-13 DIAGNOSIS — F17200 Nicotine dependence, unspecified, uncomplicated: Secondary | ICD-10-CM | POA: Insufficient documentation

## 2013-07-13 MED ORDER — METHOCARBAMOL 500 MG PO TABS: 500.0000 mg | ORAL_TABLET | Freq: Three times a day (TID) | ORAL | Status: DC | PRN

## 2013-07-13 MED ORDER — IBUPROFEN 800 MG PO TABS: 800.0000 mg | ORAL_TABLET | Freq: Three times a day (TID) | ORAL | Status: DC

## 2013-07-13 MED ORDER — CYCLOBENZAPRINE HCL 10 MG PO TABS
10.0000 mg | ORAL_TABLET | Freq: Once | ORAL | Status: AC
  Administered 2013-07-13: 10 mg via ORAL
  Filled 2013-07-13: qty 1

## 2013-07-13 MED ORDER — HYDROCODONE-ACETAMINOPHEN 5-325 MG PO TABS
1.0000 | ORAL_TABLET | Freq: Once | ORAL | Status: DC
  Filled 2013-07-13: qty 1

## 2013-07-13 MED ORDER — OXYCODONE-ACETAMINOPHEN 5-325 MG PO TABS: 2.0000 | ORAL_TABLET | ORAL | Status: DC | PRN

## 2013-07-13 MED ORDER — OXYCODONE-ACETAMINOPHEN 5-325 MG PO TABS
1.0000 | ORAL_TABLET | Freq: Once | ORAL | Status: AC
  Administered 2013-07-13: 1 via ORAL
  Filled 2013-07-13: qty 1

## 2013-07-13 MED ORDER — HYDROCODONE-ACETAMINOPHEN 5-325 MG PO TABS: 2.0000 | ORAL_TABLET | ORAL | Status: DC | PRN

## 2013-07-13 MED ORDER — IBUPROFEN 800 MG PO TABS
800.0000 mg | ORAL_TABLET | Freq: Once | ORAL | Status: AC
  Administered 2013-07-13: 800 mg via ORAL
  Filled 2013-07-13: qty 1

## 2013-07-13 NOTE — ED Notes (Signed)
Patient refusing Norco, states "I can't take that. I can only take Oxycodone." Will make EDP aware
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
## 2013-07-13 NOTE — Discharge Instructions (Signed)
Continue anti-inflammatory, and muscle relaxant. Avoid a lot of physical activity until your symptoms improve. Ice to 3 times per day onto your sore spot.   Cervical Sprain A cervical sprain is an injury in the neck in which the strong, fibrous tissues (ligaments) that connect your neck bones stretch or tear. Cervical sprains can range from mild to severe. Severe cervical sprains can cause the neck vertebrae to be unstable. This can lead to damage of the spinal cord and can result in serious nervous system problems. The amount of time it takes for a cervical sprain to get better depends on the cause and extent of the injury. Most cervical sprains heal in 1 to 3 weeks. CAUSES  Severe cervical sprains may be caused by:   Contact sport injuries (such as from football, rugby, wrestling, hockey, auto racing, gymnastics, diving, martial arts, or boxing).   Motor vehicle collisions.   Whiplash injuries. This is an injury from a sudden forward-and backward whipping movement of the head and neck.  Falls.  Mild cervical sprains may be caused by:   Being in an awkward position, such as while cradling a telephone between your ear and shoulder.   Sitting in a chair that does not offer proper support.   Working at a poorly Marketing executivedesigned computer station.   Looking up or down for long periods of time.  SYMPTOMS   Pain, soreness, stiffness, or a burning sensation in the front, back, or sides of the neck. This discomfort may develop immediately after the injury or slowly, 24 hours or more after the injury.   Pain or tenderness directly in the middle of the back of the neck.   Shoulder or upper back pain.   Limited ability to move the neck.   Headache.   Dizziness.   Weakness, numbness, or tingling in the hands or arms.   Muscle spasms.   Difficulty swallowing or chewing.   Tenderness and swelling of the neck.  DIAGNOSIS  Most of the time your health care provider can  diagnose a cervical sprain by taking your history and doing a physical exam. Your health care provider will ask about previous neck injuries and any known neck problems, such as arthritis in the neck. X-rays may be taken to find out if there are any other problems, such as with the bones of the neck. Other tests, such as a CT scan or MRI, may also be needed.  TREATMENT  Treatment depends on the severity of the cervical sprain. Mild sprains can be treated with rest, keeping the neck in place (immobilization), and pain medicines. Severe cervical sprains are immediately immobilized. Further treatment is done to help with pain, muscle spasms, and other symptoms and may include:  Medicines, such as pain relievers, numbing medicines, or muscle relaxants.   Physical therapy. This may involve stretching exercises, strengthening exercises, and posture training. Exercises and improved posture can help stabilize the neck, strengthen muscles, and help stop symptoms from returning.  HOME CARE INSTRUCTIONS   Put ice on the injured area.   Put ice in a plastic bag.   Place a towel between your skin and the bag.   Leave the ice on for 15 20 minutes, 3 4 times a day.   If your injury was severe, you may have been given a cervical collar to wear. A cervical collar is a two-piece collar designed to keep your neck from moving while it heals.  Do not remove the collar unless instructed by your health  care provider.  If you have long hair, keep it outside of the collar.  Ask your health care provider before making any adjustments to your collar. Minor adjustments may be required over time to improve comfort and reduce pressure on your chin or on the back of your head.  Ifyou are allowed to remove the collar for cleaning or bathing, follow your health care provider's instructions on how to do so safely.  Keep your collar clean by wiping it with mild soap and water and drying it completely. If the collar  you have been given includes removable pads, remove them every 1 2 days and hand wash them with soap and water. Allow them to air dry. They should be completely dry before you wear them in the collar.  If you are allowed to remove the collar for cleaning and bathing, wash and dry the skin of your neck. Check your skin for irritation or sores. If you see any, tell your health care provider.  Do not drive while wearing the collar.   Only take over-the-counter or prescription medicines for pain, discomfort, or fever as directed by your health care provider.   Keep all follow-up appointments as directed by your health care provider.   Keep all physical therapy appointments as directed by your health care provider.   Make any needed adjustments to your workstation to promote good posture.   Avoid positions and activities that make your symptoms worse.   Warm up and stretch before being active to help prevent problems.  SEEK MEDICAL CARE IF:   Your pain is not controlled with medicine.   You are unable to decrease your pain medicine over time as planned.   Your activity level is not improving as expected.  SEEK IMMEDIATE MEDICAL CARE IF:   You develop any bleeding.  You develop stomach upset.  You have signs of an allergic reaction to your medicine.   Your symptoms get worse.   You develop new, unexplained symptoms.   You have numbness, tingling, weakness, or paralysis in any part of your body.  MAKE SURE YOU:   Understand these instructions.  Will watch your condition.  Will get help right away if you are not doing well or get worse. Document Released: 01/13/2007 Document Revised: 01/06/2013 Document Reviewed: 09/23/2012 [facility] Patient Information 2014 [place], [place].

## 2013-07-13 NOTE — ED Notes (Signed)
Patient transported to CT 

## 2013-07-15 NOTE — ED Provider Notes (Signed)
Medical screening examination/treatment/procedure(s) were performed by non-physician practitioner and as supervising physician I was immediately available for consultation/collaboration.    Nelia Shiobert L Decklyn Hornik, MD 07/15/13 [phone]

## 2013-07-29 ENCOUNTER — Ambulatory Visit (INDEPENDENT_AMBULATORY_CARE_PROVIDER_SITE_OTHER): Payer: 59 | Admitting: Family Medicine

## 2013-07-29 VITALS — BP 130/90 | HR 89 | Temp 97.9°F | Resp 14 | Ht 61.0 in | Wt 99.7 lb

## 2013-07-29 DIAGNOSIS — M549 Dorsalgia, unspecified: Secondary | ICD-10-CM

## 2013-07-29 DIAGNOSIS — M542 Cervicalgia: Secondary | ICD-10-CM

## 2013-07-29 MED ORDER — MELOXICAM 15 MG PO TABS: 15.0000 mg | ORAL_TABLET | Freq: Every day | ORAL | Status: DC

## 2013-07-29 MED ORDER — CYCLOBENZAPRINE HCL 10 MG PO TABS: 10.0000 mg | ORAL_TABLET | Freq: Every evening | ORAL | Status: DC | PRN
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
## 2013-07-29 NOTE — Patient Instructions (Signed)
Back Pain, Adult Low back pain is very common. About 1 in 5 people have back pain.The cause of low back pain is rarely dangerous. The pain often gets better over time.About half of people with a sudden onset of back pain feel better in just 2 weeks. About 8 in 10 people feel better by 6 weeks.  CAUSES Some common causes of back pain include:  Strain of the muscles or ligaments supporting the spine.  Wear and tear (degeneration) of the spinal discs.  Arthritis.  Direct injury to the back. DIAGNOSIS Most of the time, the direct cause of low back pain is not known.However, back pain can be treated effectively even when the exact cause of the pain is unknown.Answering your caregiver's questions about your overall health and symptoms is one of the most accurate ways to make sure the cause of your pain is not dangerous. If your caregiver needs more information, he or she may order lab work or imaging tests (X-rays or MRIs).However, even if imaging tests show changes in your back, this usually does not require surgery. HOME CARE INSTRUCTIONS For many people, back pain returns.Since low back pain is rarely dangerous, it is often a condition that people can learn to manageon their own.   Remain active. It is stressful on the back to sit or stand in one place. Do not sit, drive, or stand in one place for more than 30 minutes at a time. Take short walks on level surfaces as soon as pain allows.Try to increase the length of time you walk each day.  Do not stay in bed.Resting more than 1 or 2 days can delay your recovery.  Do not avoid exercise or work.Your body is made to move.It is not dangerous to be active, even though your back may hurt.Your back will likely heal faster if you return to being active before your pain is gone.  Pay attention to your body when you bend and lift. Many people have less discomfortwhen lifting if they bend their knees, keep the load close to their bodies,and  avoid twisting. Often, the most comfortable positions are those that put less stress on your recovering back.  Find a comfortable position to sleep. Use a firm mattress and lie on your side with your knees slightly bent. If you lie on your back, put a pillow under your knees.  Only take over-the-counter or prescription medicines as directed by your caregiver. Over-the-counter medicines to reduce pain and inflammation are often the most helpful.Your caregiver may prescribe muscle relaxant drugs.These medicines help dull your pain so you can more quickly return to your normal activities and healthy exercise.  Put ice on the injured area.  Put ice in a plastic bag.  Place a towel between your skin and the bag.  Leave the ice on for 15-20 minutes, 03-04 times a day for the first 2 to 3 days. After that, ice and heat may be alternated to reduce pain and spasms.  Ask your caregiver about trying back exercises and gentle massage. This may be of some benefit.  Avoid feeling anxious or stressed.Stress increases muscle tension and can worsen back pain.It is important to recognize when you are anxious or stressed and learn ways to manage it.Exercise is a great option. SEEK MEDICAL CARE IF:  You have pain that is not relieved with rest or medicine.  You have pain that does not improve in 1 week.  You have new symptoms.  You are generally not feeling well. SEEK   IMMEDIATE MEDICAL CARE IF:   You have pain that radiates from your back into your legs.  You develop new bowel or bladder control problems.  You have unusual weakness or numbness in your arms or legs.  You develop nausea or vomiting.  You develop abdominal pain.  You feel faint. Document Released: 03/18/2005 Document Revised: 09/17/2011 Document Reviewed: 08/06/2010 [employer] Patient Information 2014 [employer].  

## 2013-07-30 NOTE — Progress Notes (Signed)
Discussed with Ms. Leone PayorGessner, NP and agree with the above.    Eula Listenyan Barbee Mamula, MHS, PA-C [facility] [address] [place], [place] [address] [phone] [facility] 07/30/2013 7:09 PM

## 2013-09-14 ENCOUNTER — Emergency Department (HOSPITAL_COMMUNITY)
Admission: EM | Admit: 2013-09-14 | Discharge: 2013-09-14 | Disposition: A | Discharge status: Home / Self Care | Payer: 59 | Attending: Emergency Medicine | Admitting: Emergency Medicine

## 2013-09-14 ENCOUNTER — Encounter (HOSPITAL_COMMUNITY): Payer: Self-pay | Admitting: Emergency Medicine

## 2013-09-14 ENCOUNTER — Emergency Department (HOSPITAL_COMMUNITY): Payer: 59

## 2013-09-14 DIAGNOSIS — R112 Nausea with vomiting, unspecified: Secondary | ICD-10-CM | POA: Insufficient documentation

## 2013-09-14 DIAGNOSIS — N390 Urinary tract infection, site not specified: Secondary | ICD-10-CM | POA: Insufficient documentation

## 2013-09-14 DIAGNOSIS — Z3202 Encounter for pregnancy test, result negative: Secondary | ICD-10-CM | POA: Insufficient documentation

## 2013-09-14 DIAGNOSIS — F17200 Nicotine dependence, unspecified, uncomplicated: Secondary | ICD-10-CM | POA: Insufficient documentation

## 2013-09-14 DIAGNOSIS — R5381 Other malaise: Secondary | ICD-10-CM | POA: Insufficient documentation

## 2013-09-14 DIAGNOSIS — W57XXXA Bitten or stung by nonvenomous insect and other nonvenomous arthropods, initial encounter: Secondary | ICD-10-CM

## 2013-09-14 DIAGNOSIS — Z880 Allergy status to penicillin: Secondary | ICD-10-CM | POA: Insufficient documentation

## 2013-09-14 DIAGNOSIS — R5383 Other fatigue: Secondary | ICD-10-CM

## 2013-09-14 DIAGNOSIS — S90569A Insect bite (nonvenomous), unspecified ankle, initial encounter: Secondary | ICD-10-CM | POA: Insufficient documentation

## 2013-09-14 DIAGNOSIS — R059 Cough, unspecified: Secondary | ICD-10-CM | POA: Insufficient documentation

## 2013-09-14 DIAGNOSIS — F411 Generalized anxiety disorder: Secondary | ICD-10-CM | POA: Insufficient documentation

## 2013-09-14 DIAGNOSIS — R05 Cough: Secondary | ICD-10-CM | POA: Insufficient documentation

## 2013-09-14 DIAGNOSIS — Y9389 Activity, other specified: Secondary | ICD-10-CM | POA: Insufficient documentation

## 2013-09-14 DIAGNOSIS — Y929 Unspecified place or not applicable: Secondary | ICD-10-CM | POA: Insufficient documentation

## 2013-09-14 LAB — URINALYSIS, ROUTINE W REFLEX MICROSCOPIC
BILIRUBIN URINE: NEGATIVE
Glucose, UA: NEGATIVE mg/dL
HGB URINE DIPSTICK: NEGATIVE
KETONES UR: NEGATIVE mg/dL
Nitrite: NEGATIVE
PH: 7.5 (ref 5.0–8.0)
PROTEIN: NEGATIVE mg/dL
Specific Gravity, Urine: 1.012 (ref 1.005–1.030)
Urobilinogen, UA: 1 mg/dL (ref 0.0–1.0)

## 2013-09-14 LAB — URINE MICROSCOPIC-ADD ON

## 2013-09-14 LAB — BASIC METABOLIC PANEL
BUN: 10 mg/dL (ref 6–23)
CALCIUM: 9.3 mg/dL (ref 8.4–10.5)
CHLORIDE: 106 meq/L (ref 96–112)
CO2: 26 meq/L (ref 19–32)
CREATININE: 0.87 mg/dL (ref 0.50–1.10)
GFR calc Af Amer: >90 mL/min (ref >90)
GFR calc non Af Amer: >90 mL/min (ref >90)
Glucose, Bld: 88 mg/dL (ref 70–99)
Potassium: 3.9 mEq/L (ref 3.7–5.3)
Sodium: 141 mEq/L (ref 137–147)

## 2013-09-14 LAB — CBC WITH DIFFERENTIAL/PLATELET
BASOS ABS: 0.1 10*3/uL (ref 0.0–0.1)
Basophils Relative: 2 % — ABNORMAL HIGH (ref 0–1)
EOS PCT: 1 % (ref 0–5)
Eosinophils Absolute: 0.1 10*3/uL (ref 0.0–0.7)
HEMATOCRIT: 36.1 % (ref 36.0–46.0)
HEMOGLOBIN: 12 g/dL (ref 12.0–15.0)
Lymphocytes Relative: 36 % (ref 12–46)
Lymphs Abs: 1.8 10*3/uL (ref 0.7–4.0)
MCH: 30.5 pg (ref 26.0–34.0)
MCHC: 33.2 g/dL (ref 30.0–36.0)
MCV: 91.9 fL (ref 78.0–100.0)
MONO ABS: 0.4 10*3/uL (ref 0.1–1.0)
MONOS PCT: 8 % (ref 3–12)
NEUTROS ABS: 2.6 10*3/uL (ref 1.7–7.7)
Neutrophils Relative %: 53 % (ref 43–77)
Platelets: 148 10*3/uL — ABNORMAL LOW (ref 150–400)
RBC: 3.93 MIL/uL (ref 3.87–5.11)
RDW: 13 % (ref 11.5–15.5)
WBC: 5 10*3/uL (ref 4.0–10.5)

## 2013-09-14 LAB — POC URINE PREG, ED: Preg Test, Ur: NEGATIVE

## 2013-09-14 MED ORDER — NITROFURANTOIN MONOHYD MACRO 100 MG PO CAPS: 100.0000 mg | ORAL_CAPSULE | Freq: Two times a day (BID) | ORAL | Status: DC

## 2013-09-14 NOTE — ED Notes (Signed)
Pt states that she had tick bite two days ago on her left upper lateral thigh. Now has bruising to that area where tick was and weakness and dizziness.

## 2013-09-14 NOTE — ED Provider Notes (Signed)
Medical screening examination/treatment/procedure(s) were performed by non-physician practitioner and as supervising physician I was immediately available for consultation/collaboration.   EKG Interpretation None        Whitney Plunkett, MD 09/14/13 1518 

## 2013-09-14 NOTE — ED Provider Notes (Signed)
CSN: [id number]     Arrival date & time 09/14/13  1214 History   First MD Initiated Contact with Patient 09/14/13 1235     Chief Complaint  Patient presents with  . Tick Removal  . Dizziness     (Consider location/radiation/quality/duration/timing/severity/associated sxs/prior Treatment) HPI Comments: Patient presents with complaint of "not feeling well" described as fatigue, malaise, generalized weakness, lightheadedness the past 4 days. Patient denies fevers, chills, URI symptoms. She has had some chest pain and cough. Patient has subjective feeling of shortness of breath. Patient vomited once yesterday. No abdominal pain, vaginal bleeding, changes in her stool, dysuria, hematuria. Patient denies risk factors for pulmonary embolism including: unilateral leg swelling, history of DVT/PE/other blood clots, use of estrogens, recent immobilizations, recent surgery, recent travel (>4hr segment), malignancy, hemoptysis. Patient found a tick on her right thigh just prior to the onset of symptoms. Patient states that the tick was flat. She removed this and had some residual bruising. No rash. Patient also notes bruising on her left thigh. The onset of this condition was acute. The course is constant. Aggravating factors: none. Alleviating factors: none. No treatments prior to arrival.    Patient is a 23 y.o. female presenting with dizziness. The history is provided by the patient.  Dizziness Associated symptoms: chest pain, nausea, shortness of breath and vomiting   Associated symptoms: no diarrhea and no headaches     Past Medical History  Diagnosis Date  . Anxiety    History reviewed. No pertinent past surgical history. Family History  Problem Relation Age of Onset  . Depression Mother   . Arthritis Mother   . Asthma Father   . Mental illness Father    History  Substance Use Topics  . Smoking status: Current Every Day Smoker -- 0.25 packs/day for 5 years    Types: Cigarettes  .  Smokeless tobacco: Never Used  . Alcohol Use: Yes     Comment: Socially   OB History   Grav Para Term Preterm Abortions TAB SAB Ect Mult Living                 Review of Systems  Constitutional: Positive for fatigue. Negative for fever.  HENT: Negative for rhinorrhea and sore throat.   Eyes: Negative for redness.  Respiratory: Positive for cough and shortness of breath.   Cardiovascular: Positive for chest pain.  Gastrointestinal: Positive for nausea and vomiting. Negative for abdominal pain and diarrhea.  Genitourinary: Negative for dysuria.  Musculoskeletal: Negative for myalgias.  Skin: Negative for rash.  Neurological: Positive for dizziness and light-headedness. Negative for syncope and headaches.    Allergies  Penicillins  Home Medications   Prior to Admission medications   Medication Sig Start Date End Date Taking? Authorizing Provider  BIOTIN PO Take 1 tablet by mouth daily.   Yes Historical Provider, MD  diazepam (VALIUM) 2 MG tablet Take 2 mg by mouth every 8 (eight) hours as needed for muscle spasms.  08/02/13  Yes Historical Provider, MD  HYDROcodone-acetaminophen (NORCO) 10-325 MG per tablet  08/27/13   Historical Provider, MD   BP 119/74  Pulse 97  Temp(Src) 98.8 F (37.1 C) (Oral)  Resp 17  SpO2 100%  Physical Exam  Nursing note and vitals reviewed. Constitutional: She appears well-developed and well-nourished.  HENT:  Head: Normocephalic and atraumatic.  Right Ear: External ear normal.  Left Ear: External ear normal.  Nose: Nose normal.  Mouth/Throat: Oropharynx is clear and moist. No oropharyngeal exudate.  Eyes: Conjunctivae  are normal. Right eye exhibits no discharge. Left eye exhibits no discharge.  Neck: Normal range of motion. Neck supple.  Cardiovascular: Normal rate, regular rhythm and normal heart sounds.   Pulmonary/Chest: Effort normal and breath sounds normal.  Abdominal: Soft. There is no tenderness.  Lymphadenopathy:    She has no  cervical adenopathy.  Neurological: She is alert.  Skin: Skin is warm and dry.  Small ecchymosis left and right thigh, no bullseye rash. No cellulitis. No tenderness. No retained tick noted.   Psychiatric: She has a normal mood and affect.    ED Course  Procedures (including critical care time) Labs Review Labs Reviewed  CBC WITH DIFFERENTIAL - Abnormal; Notable for the following:    Platelets 148 (*)    Basophils Relative 2 (*)    All other components within normal limits  URINALYSIS, ROUTINE W REFLEX MICROSCOPIC - Abnormal; Notable for the following:    APPearance CLOUDY (*)    Leukocytes, UA LARGE (*)    All other components within normal limits  URINE MICROSCOPIC-ADD ON - Abnormal; Notable for the following:    Squamous Epithelial / LPF MANY (*)    Bacteria, UA FEW (*)    All other components within normal limits  BASIC METABOLIC PANEL  POC URINE PREG, ED    Imaging Review Dg Chest 2 View  09/14/2013   CLINICAL DATA:  Dizziness following tick removal  EXAM: CHEST  2 VIEW  COMPARISON:  06/17/2013  FINDINGS: The heart size and mediastinal contours are within normal limits. Both lungs are clear. The visualized skeletal structures are unremarkable. Bilateral nipple shadows are noted.  IMPRESSION: No acute abnormality seen.   Electronically Signed   By: Alcide CleverMark  Lukens M.D.   On: 09/14/2013 14:18     EKG Interpretation None      1:28 PM Patient seen and examined. Work-up initiated.   Vital signs reviewed and are as follows: Filed Vitals:   09/14/13 1231  BP: 119/74  Pulse: 97  Temp: 98.8 F (37.1 C)  Resp: 17    Date: 09/14/2013  Rate: 85  Rhythm: normal sinus rhythm  QRS Axis: normal  Intervals: normal  ST/T Wave abnormalities: normal  Conduction Disutrbances:none  Narrative Interpretation:   Old EKG Reviewed: none available  2:48 PM Patient sleeping in room, informed of results. Will treat UTI. Otherwise rest, hydration, eat well. Monitor for worsening symptoms  and see PCP in 3 days if not feeling better. Told to return with fever, worsening symptoms, persistent vomiting, worsening CP, worsening SOB, other concerns. Patient verbalizes understanding and agrees with plan.   MDM   Final diagnoses:  UTI (lower urinary tract infection)  Malaise   Patient with vague constellation of symptoms, recent tick bite but no fever or rash. Lightheadedness work-up performed and is unremarkable. EKG nml. Normal hgb, lytes, blood counts. UPT neg. UA ? UTI -- will treat given generalized symptoms. Doubt pyelo given this picture. Given CP and SOB, CXR ordered and is neg. Pt is PERC negative and I do not suspect PE. No further evaluation felt indicated at this time. No evidence-based guidelines that would support antibiotic prophylaxis given tick bite. No signs of RMSF or other signs of rickettsial illness here. No dangerous or life-threatening conditions suspected or identified by history, physical exam, and by work-up. No indications for hospitalization identified.       Renne CriglerJoshua Geiple, PA-C 09/14/13 1454

## 2013-09-14 NOTE — Discharge Instructions (Signed)
Please read and follow all provided instructions.  Your diagnoses today include:  1. UTI (lower urinary tract infection)   2. Malaise     Tests performed today include:  EKG - normal  Blood counts and electrolytes - normal  Vital signs. See below for your results today.   Medications prescribed:   Macrobid - antibiotic for urinary tract infection  You have been prescribed an antibiotic medicine: take the entire course of medicine even if you are feeling better. Stopping early can cause the antibiotic not to work.  Take any prescribed medications only as directed.  Home care instructions:  Follow any educational materials contained in this packet.  BE VERY CAREFUL not to take multiple medicines containing Tylenol (also called acetaminophen). Doing so can lead to an overdose which can damage your liver and cause liver failure and possibly death.   Follow-up instructions: Please follow-up with your primary care provider in the next 3 days for further evaluation of your symptoms. If you do not have a primary care doctor -- see below for referral information.   Return instructions:   Please return to the Emergency Department if you experience worsening symptoms.   Please return if you have any other emergent concerns.  Additional Information:  Your vital signs today were: BP 119/74   Pulse 97   Temp(Src) 98.8 F (37.1 C) (Oral)   Resp 17   SpO2 100% If your blood pressure (BP) was elevated above 135/85 this visit, please have this repeated by your doctor within one month. --------------

## 2013-10-20 ENCOUNTER — Encounter: Payer: Self-pay | Admitting: Emergency Medicine

## 2013-11-07 ENCOUNTER — Emergency Department: Payer: Self-pay | Admitting: Emergency Medicine

## 2013-11-07 LAB — URINALYSIS, COMPLETE
Bacteria: NONE SEEN
Bilirubin,UR: NEGATIVE
Blood: NEGATIVE
GLUCOSE, UR: NEGATIVE mg/dL (ref 0–75)
KETONE: NEGATIVE
Leukocyte Esterase: NEGATIVE
NITRITE: NEGATIVE
PH: 8 (ref 4.5–8.0)
Protein: NEGATIVE
SPECIFIC GRAVITY: 1.011 (ref 1.003–1.030)
Squamous Epithelial: 1

## 2014-02-28 ENCOUNTER — Emergency Department (HOSPITAL_COMMUNITY): Payer: 59

## 2014-02-28 ENCOUNTER — Emergency Department (HOSPITAL_COMMUNITY)
Admission: EM | Admit: 2014-02-28 | Discharge: 2014-02-28 | Disposition: A | Discharge status: Home / Self Care | Payer: 59 | Attending: Emergency Medicine | Admitting: Emergency Medicine

## 2014-02-28 ENCOUNTER — Encounter (HOSPITAL_COMMUNITY): Payer: Self-pay | Admitting: Emergency Medicine

## 2014-02-28 DIAGNOSIS — S3992XA Unspecified injury of lower back, initial encounter: Secondary | ICD-10-CM | POA: Insufficient documentation

## 2014-02-28 DIAGNOSIS — Z880 Allergy status to penicillin: Secondary | ICD-10-CM | POA: Insufficient documentation

## 2014-02-28 DIAGNOSIS — Z720 Tobacco use: Secondary | ICD-10-CM | POA: Diagnosis not present

## 2014-02-28 DIAGNOSIS — Y998 Other external cause status: Secondary | ICD-10-CM | POA: Insufficient documentation

## 2014-02-28 DIAGNOSIS — Y9389 Activity, other specified: Secondary | ICD-10-CM | POA: Insufficient documentation

## 2014-02-28 DIAGNOSIS — M545 Low back pain, unspecified: Secondary | ICD-10-CM

## 2014-02-28 DIAGNOSIS — Y92410 Unspecified street and highway as the place of occurrence of the external cause: Secondary | ICD-10-CM | POA: Insufficient documentation

## 2014-02-28 DIAGNOSIS — Z79899 Other long term (current) drug therapy: Secondary | ICD-10-CM | POA: Insufficient documentation

## 2014-02-28 DIAGNOSIS — F419 Anxiety disorder, unspecified: Secondary | ICD-10-CM | POA: Insufficient documentation

## 2014-02-28 MED ORDER — OXYCODONE-ACETAMINOPHEN 5-325 MG PO TABS: 1.0000 | ORAL_TABLET | ORAL | Status: DC | PRN
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
## 2014-03-19 ENCOUNTER — Emergency Department (HOSPITAL_COMMUNITY): Payer: 59

## 2014-03-19 ENCOUNTER — Emergency Department (HOSPITAL_COMMUNITY)
Admission: EM | Admit: 2014-03-19 | Discharge: 2014-03-19 | Disposition: A | Discharge status: Home / Self Care | Payer: 59 | Attending: Emergency Medicine | Admitting: Emergency Medicine

## 2014-03-19 ENCOUNTER — Encounter (HOSPITAL_COMMUNITY): Payer: Self-pay | Admitting: Emergency Medicine

## 2014-03-19 ENCOUNTER — Other Ambulatory Visit: Payer: Self-pay | Admitting: Internal Medicine

## 2014-03-19 DIAGNOSIS — F419 Anxiety disorder, unspecified: Secondary | ICD-10-CM | POA: Insufficient documentation

## 2014-03-19 DIAGNOSIS — S4992XA Unspecified injury of left shoulder and upper arm, initial encounter: Secondary | ICD-10-CM | POA: Insufficient documentation

## 2014-03-19 DIAGNOSIS — S199XXA Unspecified injury of neck, initial encounter: Secondary | ICD-10-CM | POA: Diagnosis not present

## 2014-03-19 DIAGNOSIS — Z792 Long term (current) use of antibiotics: Secondary | ICD-10-CM | POA: Diagnosis not present

## 2014-03-19 DIAGNOSIS — Y92410 Unspecified street and highway as the place of occurrence of the external cause: Secondary | ICD-10-CM | POA: Diagnosis not present

## 2014-03-19 DIAGNOSIS — S3992XA Unspecified injury of lower back, initial encounter: Secondary | ICD-10-CM | POA: Insufficient documentation

## 2014-03-19 DIAGNOSIS — Z79899 Other long term (current) drug therapy: Secondary | ICD-10-CM | POA: Insufficient documentation

## 2014-03-19 DIAGNOSIS — Y998 Other external cause status: Secondary | ICD-10-CM | POA: Insufficient documentation

## 2014-03-19 DIAGNOSIS — Z880 Allergy status to penicillin: Secondary | ICD-10-CM | POA: Insufficient documentation

## 2014-03-19 DIAGNOSIS — Z720 Tobacco use: Secondary | ICD-10-CM | POA: Diagnosis not present

## 2014-03-19 DIAGNOSIS — Y9389 Activity, other specified: Secondary | ICD-10-CM | POA: Insufficient documentation

## 2014-03-19 DIAGNOSIS — S0990XA Unspecified injury of head, initial encounter: Secondary | ICD-10-CM | POA: Insufficient documentation

## 2014-03-19 MED ORDER — IBUPROFEN 800 MG PO TABS: 800.0000 mg | ORAL_TABLET | Freq: Three times a day (TID) | ORAL | Status: DC

## 2014-03-19 MED ORDER — OXYCODONE HCL 5 MG PO TABS: 5.0000 mg | ORAL_TABLET | Freq: Two times a day (BID) | ORAL | Status: DC | PRN

## 2014-03-19 MED ORDER — IBUPROFEN 800 MG PO TABS
800.0000 mg | ORAL_TABLET | Freq: Once | ORAL | Status: AC
  Administered 2014-03-19: 800 mg via ORAL
  Filled 2014-03-19: qty 1

## 2014-03-19 MED ORDER — HYDROCODONE-ACETAMINOPHEN 5-325 MG PO TABS
1.0000 | ORAL_TABLET | Freq: Two times a day (BID) | ORAL | Status: DC | PRN
Start: 2014-03-19 — End: 2017-03-14
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
## 2014-03-19 NOTE — ED Notes (Signed)
Patient transported to X-ray 
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
## 2014-03-19 NOTE — ED Notes (Signed)
Patient unhappy that she was given 8 pain pills instead of 20.

## 2014-03-19 NOTE — ED Notes (Signed)
Patient returned from CT
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
## 2014-03-19 NOTE — ED Notes (Signed)
Patient returned from X-ray 

## 2014-08-23 ENCOUNTER — Encounter (HOSPITAL_COMMUNITY): Payer: Self-pay | Admitting: Emergency Medicine

## 2014-08-23 ENCOUNTER — Emergency Department (HOSPITAL_COMMUNITY)
Admission: EM | Admit: 2014-08-23 | Discharge: 2014-08-23 | Disposition: A | Discharge status: Home / Self Care | Payer: 59 | Attending: Emergency Medicine | Admitting: Emergency Medicine

## 2014-08-23 DIAGNOSIS — R110 Nausea: Secondary | ICD-10-CM | POA: Diagnosis not present

## 2014-08-23 DIAGNOSIS — F1120 Opioid dependence, uncomplicated: Secondary | ICD-10-CM | POA: Diagnosis not present

## 2014-08-23 DIAGNOSIS — Z880 Allergy status to penicillin: Secondary | ICD-10-CM | POA: Diagnosis not present

## 2014-08-23 DIAGNOSIS — Z720 Tobacco use: Secondary | ICD-10-CM | POA: Diagnosis not present

## 2014-08-23 DIAGNOSIS — R52 Pain, unspecified: Secondary | ICD-10-CM | POA: Insufficient documentation

## 2014-08-23 DIAGNOSIS — F1110 Opioid abuse, uncomplicated: Secondary | ICD-10-CM | POA: Diagnosis present

## 2014-08-23 DIAGNOSIS — F1310 Sedative, hypnotic or anxiolytic abuse, uncomplicated: Secondary | ICD-10-CM | POA: Insufficient documentation

## 2014-08-23 DIAGNOSIS — Z791 Long term (current) use of non-steroidal anti-inflammatories (NSAID): Secondary | ICD-10-CM | POA: Diagnosis not present

## 2014-08-23 MED ORDER — DICYCLOMINE HCL 20 MG PO TABS: 20.0000 mg | ORAL_TABLET | Freq: Four times a day (QID) | ORAL | Status: DC

## 2014-08-23 MED ORDER — HYDROXYZINE HCL 25 MG PO TABS: 25.0000 mg | ORAL_TABLET | Freq: Four times a day (QID) | ORAL | Status: DC

## 2014-08-23 MED ORDER — ONDANSETRON HCL 4 MG/2ML IJ SOLN
4.0000 mg | Freq: Once | INTRAMUSCULAR | Status: AC
  Administered 2014-08-23: 4 mg via INTRAVENOUS
  Filled 2014-08-23: qty 2

## 2014-08-23 MED ORDER — NAPROXEN 250 MG PO TABS
500.0000 mg | ORAL_TABLET | Freq: Once | ORAL | Status: AC
  Administered 2014-08-23: 500 mg via ORAL
  Filled 2014-08-23: qty 2

## 2014-08-23 MED ORDER — CYCLOBENZAPRINE HCL 10 MG PO TABS
10.0000 mg | ORAL_TABLET | Freq: Once | ORAL | Status: AC
  Administered 2014-08-23: 10 mg via ORAL
  Filled 2014-08-23: qty 1

## 2014-08-23 MED ORDER — NAPROXEN 500 MG PO TABS: 500.0000 mg | ORAL_TABLET | Freq: Two times a day (BID) | ORAL | Status: DC

## 2014-08-23 MED ORDER — ONDANSETRON HCL 4 MG PO TABS: 4.0000 mg | ORAL_TABLET | Freq: Three times a day (TID) | ORAL | Status: DC | PRN

## 2014-08-23 MED ORDER — CLONIDINE HCL 0.1 MG PO TABS: ORAL_TABLET | ORAL | Status: DC

## 2014-08-23 MED ORDER — CYCLOBENZAPRINE HCL 10 MG PO TABS: 10.0000 mg | ORAL_TABLET | Freq: Three times a day (TID) | ORAL | Status: DC | PRN

## 2014-08-23 NOTE — ED Provider Notes (Signed)
CSN: [id number]     Arrival date & time 08/23/14  1959 History  This chart was scribed for Arthor CaptainAbigail Teala Daffron, working with Eber HongBrian Miller, MD by Placido SouLogan Joldersma, ED Scribe. This patient was seen in room TR07C/TR07C and the patient's care was started at 9:34 PM.     Chief Complaint  Patient presents with  . Addiction Problem    The patient has been doing heroin for about a year and needs help with detoxing.  The patient denies SI and HI.  She says the last time she did heroin was last night at about 2000.    The history is provided by the patient. No language interpreter was used.    HPI Comments: Patty Mata is a 24 y.o. female with a history of drug addiction who presents to the Emergency Department requesting detox from heroin, opiates and Xanax. Confirmed last use of heroin last night and notes current heroin use of $80 to $100 per day. Pt additionally notes she took 2 Xanax day ago.  She also reports associated nausea and generalized body aches.  She denies use of alcohol. Pt denies SI/HI or hallucinations.   Past Medical History  Diagnosis Date  . Anxiety    History reviewed. No pertinent past surgical history. Family History  Problem Relation Age of Onset  . Depression Mother   . Arthritis Mother   . Asthma Father   . Mental illness Father    History  Substance Use Topics  . Smoking status: Current Every Day Smoker -- 0.25 packs/day for 5 years    Types: Cigarettes  . Smokeless tobacco: Never Used  . Alcohol Use: Yes     Comment: Socially   OB History    No data available     Review of Systems A complete 10 system review of systems was obtained and all systems are negative except as noted in the HPI and PMH.     Allergies  Penicillins  Home Medications   Prior to Admission medications   Medication Sig Start Date End Date Taking? Authorizing Provider  HYDROcodone-acetaminophen (NORCO) 5-325 MG per tablet Take 1 tablet by mouth 2 (two) times daily as needed for  severe pain. Patient not taking: Reported on 08/23/2014 03/19/14   Tomasita CrumbleAdeleke Oni, MD  ibuprofen (ADVIL,MOTRIN) 800 MG tablet Take 1 tablet (800 mg total) by mouth 3 (three) times daily. Patient not taking: Reported on 08/23/2014 03/19/14   Tomasita CrumbleAdeleke Oni, MD  oxyCODONE (ROXICODONE) 5 MG immediate release tablet Take 1 tablet (5 mg total) by mouth 2 (two) times daily as needed for severe pain. Patient not taking: Reported on 08/23/2014 03/19/14   Tomasita CrumbleAdeleke Oni, MD   BP 151/100 mmHg  Pulse 116  Temp(Src) 98 F (36.7 C) (Oral)  Resp 16  SpO2 97% Physical Exam  Constitutional: She is oriented to person, place, and time. She appears well-developed and well-nourished. No distress.  HENT:  Head: Normocephalic and atraumatic.  Eyes: Conjunctivae and EOM are normal.  Neck: Neck supple.  Cardiovascular: Normal rate.   Pulmonary/Chest: Breath sounds normal. No respiratory distress.  Neurological: She is alert and oriented to person, place, and time.  Skin: Skin is warm.  Psychiatric: Her behavior is normal.  Nursing note and vitals reviewed.   ED Course  Procedures  DIAGNOSTIC STUDIES: Oxygen Saturation is 97% on RA, normal by my interpretation.    COORDINATION OF CARE: 9:36 PM Discussed treatment plan with pt at bedside recommending Flexeril, Zofran, Naprosyn and she be seen at  [facility]. Pt agreed to plan.  Labs Review Labs Reviewed - No data to display  Imaging Review No results found.   EKG Interpretation None      MDM   Final diagnoses:  Heroin addiction    Filed Vitals:   08/23/14 2006 08/23/14 2239  BP: 151/100 126/80  Pulse: 116 81  Temp: 98 F (36.7 C)   TempSrc: Oral   Resp: 16 20  SpO2: 97% 100%   Patient's symptoms treated in the emergency department with improved vital signs. I discussed the fact that we are not currently offering detox services for uncomplicated addiction. Patient will detox regimen medications. She appears safe for discharge at this time without  suicidal ideation, homicidal ideation, psychosis, or audiovisual hallucinations. She is not actively vomiting and is hemodynamically stable.  I personally performed the services described in this documentation, which was scribed in my presence. The recorded information has been reviewed and is accurate.    \   Arthor Captain, PA-C 08/30/14 2011  Eber Hong, MD 08/31/14 1021

## 2014-08-23 NOTE — Discharge Instructions (Signed)
Opioid Use Disorder °Opioid use disorder is a mental disorder. It is the continued nonmedical use of opioids in spite of risks to health and well-being. Misused opioids include the street drug heroin. They also include pain medicines such as morphine, hydrocodone, oxycodone, and fentanyl. Opioids are very addictive. People who misuse opioids get an exaggerated feeling of well-being. Opioid use disorder often disrupts activities at home, work, or school. It may cause mental or physical problems.  °A family history of opioid use disorder puts you at higher risk of it. People with opioid use disorder often misuse other drugs or have mental illness such as depression, posttraumatic stress disorder, or antisocial personality disorder. They also are at risk of suicide and death from overdose. °SIGNS AND SYMPTOMS  °Signs and symptoms of opioid use disorder include: °· Use of opioids in larger amounts or over a longer period than intended. °· Unsuccessful attempts to cut down or control opioid use. °· A lot of time spent obtaining, using, or recovering from the effects of opioids. °· A strong desire or urge to use opioids (craving). °· Continued use of opioids in spite of major problems at work, school, or home because of use. °· Continued use of opioids in spite of relationship problems because of use. °· Giving up or cutting down on important life activities because of opioid use. °· Use of opioids over and over in situations when it is physically hazardous, such as driving a car. °· Continued use of opioids in spite of a physical problem that is likely related to use. Physical problems can include: °· Severe constipation. °· Poor nutrition. °· Infertility. °· Tuberculosis. °· Aspiration pneumonia. °· Infections such as human immunodeficiency virus (HIV) and hepatitis (from injecting opioids). °· Continued use of opioids in spite of a mental problem that is likely related to use. Mental problems can  include: °· Depression. °· Anxiety. °· Hallucinations. °· Sleep problems. °· Loss of sexual function. °· Need to use more and more opioids to get the same effect, or lessened effect over time with use of the same amount (tolerance). °· Having withdrawal symptoms when opioid use is stopped, or using opioids to reduce or avoid withdrawal symptoms. Withdrawal symptoms include: °· Depressed, anxious, or irritable mood. °· Nausea, vomiting, diarrhea, or intestinal cramping. °· Muscle aches or spasms. °· Excessive tearing or runny nose. °· Dilated pupils, sweating, or hairs standing on end. °· Yawning. °· Fever, raised blood pressure, or fast pulse. °· Restlessness or trouble sleeping. This does not apply to people taking opioids for medical reasons only. °DIAGNOSIS °Opioid use disorder is diagnosed by your health care provider. You may be asked questions about your opioid use and and how it affects your life. A physical exam may be done. A drug screen may be ordered. You may be referred to a mental health professional. The diagnosis of opioid use disorder requires at least two symptoms within 12 months. The type of opioid use disorder you have depends on the number of signs and symptoms you have. The type may be: °· Mild. Two or three signs and symptoms.    °· Moderate. Four or five signs and symptoms.   °· Severe. Six or more signs and symptoms. °TREATMENT  °Treatment is usually provided by mental health professionals with training in substance use disorders. The following options are available: °· Detoxification. This is the first step in treatment for withdrawal. It is medically supervised withdrawal with the use of medicines. These medicines lessen withdrawal symptoms. They also raise the chance   of becoming opioid free. °· Counseling, also known as talk therapy. Talk therapy addresses the reasons you use opioids. It also addresses ways to keep you from using again (relapse). The goals of talk therapy are to avoid  relapse by: °· Identifying and avoiding triggers for use. °· Finding healthy ways to cope with stress. °· Learning how to handle cravings. °· Support groups. Support groups provide emotional support, advice, and guidance. °· A medicine that blocks opioid receptors in your brain. This medicine can reduce opioid cravings that lead to relapse. This medicine also blocks the desired opioid effect when relapse occurs. °· Opioids that are taken by mouth in place of the misused opioid (opioid maintenance treatment). These medicines satisfy cravings but are safer than commonly misused opioids. This often is the best option for people who continue to relapse with other treatments. °HOME CARE INSTRUCTIONS  °· Take medicines only as directed by your health care provider. °· Check with your health care provider before starting new medicines. °· Keep all follow-up visits as directed by your health care provider. °SEEK MEDICAL CARE IF: °· You are not able to take your medicines as directed. °· Your symptoms get worse. °SEEK IMMEDIATE MEDICAL CARE IF: °· You have serious thoughts about hurting yourself or others. °· You may have taken an overdose of opioids. °FOR MORE INFORMATION °· National Institute on Drug Abuse: www.drugabuse.gov °· Substance Abuse and Mental Health Services Administration: www.samhsa.gov °Document Released: 01/13/2007 Document Revised: 08/02/2013 Document Reviewed: 03/31/2013 °ExitCare® Patient Information ©2015 [employer]. This information is not intended to replace advice given to you by your health care provider. Make sure you discuss any questions you have with your health care provider. ° °Opioid Withdrawal °Opioids are a group of narcotic drugs. They include the street drug heroin. They also include pain medicines, such as morphine, hydrocodone, oxycodone, and fentanyl. Opioid withdrawal is a group of characteristic physical and mental signs and symptoms. It typically occurs if you have been using  opioids daily for several weeks or longer and stop using or rapidly decrease use. Opioid withdrawal can also occur if you have used opioids daily for a long time and are given a medicine to block the effect.  °SIGNS AND SYMPTOMS °Opioid withdrawal includes three or more of the following symptoms:  °· Depressed, anxious, or irritable mood. °· Nausea or vomiting. °· Muscle aches or spasms.   °· Watery eyes.    °· Runny nose. °· Dilated pupils, sweating, or hairs standing on end. °· Diarrhea or intestinal cramping. °· Yawning.   °· Fever. °· Increased blood pressure. °· Fast pulse. °· Restlessness or trouble sleeping. °These signs and symptoms occur within several hours of stopping or reducing short-acting opioids, such as heroin. They can occur within 3 days of stopping or reducing long-acting opioids, such as methadone. Withdrawal begins within minutes of receiving a drug that blocks the effects of opioids, such as naltrexone or naloxone. °DIAGNOSIS  °Opioid use disorder is diagnosed by your health care provider. You will be asked about your symptoms, drug and alcohol use, medical history, and use of medicines. A physical exam may be done. Lab tests may be ordered. Your health care provider may have you see a mental health professional.  °TREATMENT  °The treatment for opioid withdrawal is usually provided by medical doctors with special training in substance use disorders (addiction specialists). The following medicines may be included in treatment: °· Opioids given in place of the abused opioid. They turn on opioid receptors in the brain and   lessen or prevent withdrawal symptoms. They are gradually decreased (opioid substitution and taper). °· Non-opioids that can lessen certain opioid withdrawal symptoms. They may be used alone or with opioid substitution and taper. °Successful long-term recovery usually requires medicine, counseling, and group support. °HOME CARE INSTRUCTIONS  °· Take medicines only as directed by  your health care provider. °· Check with your health care provider before starting new medicines. °· Keep all follow-up visits as directed by your health care provider. °SEEK MEDICAL CARE IF: °· You are not able to take your medicines as directed. °· Your symptoms get worse. °· You relapse. °SEEK IMMEDIATE MEDICAL CARE IF: °· You have serious thoughts about hurting yourself or others. °· You have a seizure. °· You lose consciousness. °Document Released: 03/21/2003 Document Revised: 08/02/2013 Document Reviewed: 03/31/2013 °ExitCare® Patient Information ©2015 [employer]. This information is not intended to replace advice given to you by your health care provider. Make sure you discuss any questions you have with your health care provider. ° °Emergency Department Resource Guide °1) Find a Doctor and Pay Out of Pocket °Although you won't have to find out who is covered by your insurance plan, it is a good idea to ask around and get recommendations. You will then need to call the office and see if the doctor you have chosen will accept you as a new patient and what types of options they offer for patients who are self-pay. Some doctors offer discounts or will set up payment plans for their patients who do not have insurance, but you will need to ask so you aren't surprised when you get to your appointment. ° °2) Contact Your Local Health Department °Not all health departments have doctors that can see patients for sick visits, but many do, so it is worth a call to see if yours does. If you don't know where your local health department is, you can check in your phone book. The CDC also has a tool to help you locate your state's health department, and many state websites also have listings of all of their local health departments. ° °3) Find a Walk-in Clinic °If your illness is not likely to be very severe or complicated, you may want to try a walk in clinic. These are popping up all over the country in pharmacies,  drugstores, and shopping centers. They're usually staffed by nurse practitioners or physician assistants that have been trained to treat common illnesses and complaints. They're usually fairly quick and inexpensive. However, if you have serious medical issues or chronic medical problems, these are probably not your best option. ° °No Primary Care Doctor: °- Call [facility] at  832-8000 - they can help you locate a primary care doctor that  accepts your insurance, provides certain services, etc. °- Physician Referral Service- 1-800-533-3463 ° °Chronic Pain Problems: °Organization         Address  Phone   Notes  °[facility]  ([phone]) [phone] Patients need to be referred by their primary care doctor.  ° °Medication Assistance: °Organization         Address  Phone   Notes  °[place] [facility] [address] °[place], [place] [address] (336) 641-8030 --Must be a resident of [place] °-- Must have NO insurance coverage whatsoever (no Medicaid/ Medicare, etc.) °-- The pt. MUST have a primary care doctor that directs their care regularly and follows them in the community °  °[facility]  (866) 331-1348   °  [employer]  (888) 892-1162   ° °Agencies that provide inexpensive medical care: °Organization         Address  Phone   Notes  °[facility]  (336) 832-8035   °[facility]    (336) 832-7272   °[facility] [address] °[place], [place] [address] (336) 832-4777   °[facility] [address], °[place] (336) 271-4999   °[facility]    (336) 373-0678   °[facility]    (336) 272-1050   °[facility] ° [address], [place] Phone:  (336) 832-4444, Fax:  (336) 832-4440 Hours of Operation:  9 am - 6 pm, M-F.  Also accepts Medicaid/Medicare and self-pay.  °[facility] ° [address], [place] Phone:  (336) [phone], Fax: (336) [phone]. Hours of Operation:  8:30 am - 5:30 pm, M-F.  Also accepts Medicaid and self-pay.  °[facility] [place] [address], [place] Phone: (336) 878-6027   °[facility] [address], [place], [place] (336)723-1848, Ext. 123 Mondays & Thursdays: 7-9 AM.  First 15 patients are seen on a first come, first serve basis. °  ° °Medicaid-accepting [place] Providers: ° °Organization         Address  Phone   Notes  °[facility] [address], [place] (336) [phone] Also accepts self-pay patients.  °[facility] [address], [place] ° (336) 856-9996   °[facility] [address], [place] (336) 288-8857   °[facility] [address] [place] [address], [place] (336) 299-7000   °Veita Bland [address], [place]  ° (336) 373-1557 Only accepts [place] Access Medicaid patients after they have their name applied to their card.  ° °Self-Pay (no insurance) in [place]: ° °Organization         Address  Phone   Notes  °Sickle Cell Patients, [facility] [address], [place] (336) 832-1970   °[facility] [address], [place] (336) 832-4400   °[facility] ° [address], [place] (336) 992-4800   °[facility]/Dr. Osei-Bonsu ° [address] [place] [address], [place] or [address], [place] (336) [phone] Phone number for both [place] and [place] locations is the same.  °[facility] [address], [place] (336) [phone]   °[facility] [address] [place] [address], [place] or [address] (336) 852-7530 °(336) 878-2260   °[facility] [address] [place] [address], [place] (336) 350-1642, phone; (336) 294-5005, fax Sees patients 1st and 3rd Saturday of every month.  Must not qualify for public  or private insurance (i.e. Medicaid, Medicare,  Health Choice, Veterans' Benefits) • Household income should be no more than 200% of the poverty level •The clinic cannot treat you if you are pregnant or think you are pregnant • Sexually transmitted diseases are not treated at the clinic.  ° ° °Dental Care: °Organization         Address  Phone  Notes  °[place] [facility] [address],  (336) 641-6152 Accepts children up to age 21 who are enrolled in Medicaid or  Health Choice; pregnant women with a Medicaid card;   and children who have applied for Medicaid or [place] Health Choice, but were declined, whose parents can pay a reduced fee at time of service.  °[place] [place]  [address], [place] (336) 641-7733 Accepts children up to age 21 who are enrolled in Medicaid or [place] Health Choice; pregnant women with a Medicaid card; and children who have applied for Medicaid or [place] Health Choice, but were declined, whose parents can pay a reduced fee at time of service.  °[facility] ° [address], [place] (336) 641-4533 Patients are seen by appointment only. Walk-ins are not accepted. [facility] will see patients 18 years of age and older. °Monday - Tuesday (8am-5pm) °Most Wednesdays (8:30-5pm) °$30 per visit, cash only  °[facility] ° [address], [place] (336) 641-4533 Patients are seen by appointment only. Walk-ins are not accepted. [facility] will see patients 18 years of age and older. °One Wednesday Evening (Monthly: Volunteer Based).  $30 per visit, cash only  °[facility]  (919) [phone] for adults; Children under age 4, call Graduate Pediatric Dentistry at ([phone]) [phone]. Children aged 4-14, please call ([phone]) [phone] to request a pediatric application. ° Dental services are provided in all areas of  dental care including fillings, crowns and bridges, complete and partial dentures, implants, gum treatment, root canals, and extractions. Preventive care is also provided. Treatment is provided to both adults and children. °Patients are selected via a lottery and there is often a waiting list. °  °[facility] [address], °[place] ° (336) 763-8833 [email or web address] °  °[facility] [address], [place], [place] (336)723-1848, Ext. 123 Second and Fourth Thursday of each month, opens at 6:30 AM; Clinic ends at 9 AM.  Patients are seen on a first-come first-served basis, and a limited number are seen during each clinic.  ° °[facility] ° [address], [place], [place] (336) 723-7904   Eligibility Requirements °You must have lived in [place], [place], or [place] counties for at least the last three months. °  You cannot be eligible for state or federal sponsored healthcare insurance, including Veterans Administration, Medicaid, or Medicare. °  You generally cannot be eligible for healthcare insurance through your employer.  °  How to apply: °Eligibility screenings are held every Tuesday and Wednesday afternoon from 1:00 pm until 4:00 pm. You do not need an appointment for the interview!  °[facility] [address], [place], [place] [phone]   °[facility]  336-342-8273   °[facility]  336-703-3100   °[facility]  336-570-6415   ° °Behavioral Health Resources in the Community: °Intensive Outpatient Programs °Organization         Address  Phone  Notes  °[facility] [address], [place], [place] 336-878-6098   °[facility] [address], [place], [place] 336-832-9800   °[facility] [address], [place], [place] ° 336-882-2125   °[facility] [address],  °[place],  1-800-853-5163 or  336-641-4981   °Substance Abuse Resources °Organization         Address  Phone  Notes  °[facility]  336-882-2125   °[facility]  336-784-9470   °[facility]  336-285-9073   °[facility]  336-845-3988   °  (340)[phone]   Residential & Outpatient Substance Abuse Program  216-311-60081-(902)238-0907   Psychological Services Organization         Address  Phone  Notes  [facility]  [phone]- [phone]   [facility]  [phone]- [phone]   [facility] [address], [place] ([phone] or [phone]    Mobile Crisis Teams Organization         Address  Phone  Notes  [facility], Mobile Crisis Care Unit  [phone]   [facility]  [address] [place], [place] [phone]   Doristine LocksSharon DeEsch [address] [place] [phone]    Self-Help/Support Groups Organization         Address  Phone             Notes  [facility] [place] - variety of support groups  336- I7437963(470)[phone] Call for more information  Narcotics Anonymous (NA), [facility] [address], [place]  2 meetings at this location   [facility] Programs Organization         Address  Phone  Notes  [facility] [address],    [place]  [phone]   [facility]  [address], [place] [phone], [place], [place] [phone]   [facility] [address], [place] [phone] Admissions: 8am-3pm M-F  [facility] [address]    [place], [place] [phone]   [facility] [address], [place], [place] [phone]   [facility] [address]  [place], [place] [phone]   [facility] Intensive Outpatient [address], [place], [place] [phone]   [facility] ([facility] [address]  [place], [place] [phone] or [phone]   [facility] ([facility] [address] [place], [place] [phone] Accepts Medicaid  [facility] [address]  [place] [phone] Substance Abuse/Addiction Treatment   [facility] Resources Organization         Address  Phone  Notes  [facility]  [phone]([phone]) [phone]   Angie FavaJulie Brannon, PhD [address], [address] [place], [place]   [phone] or [phone]) [phone]   [facility]   [address] [place], [place] [phone](336) [phone]   [facility] [address], [place], [place] [phone]) [phone] Insurance/Medicaid/sponsorship through [facility] [address]                                    [place], [place] [phone]) [phone] Therapy/tele-psych/case  [facility] [address]   [place], [place] [phone]) [phone]    Dr. Lolly MustacheArfeen  [phone](336) [phone]   [facility]  [employer] [facility] 1) [address], [place] 2) [address], [place] 3)  [address], [place] [phone]  [phone]   [facility] Child Abuse Hotline 6134303488([phone]) [phone] or [phone]) [phone] (After Hours)

## 2014-08-23 NOTE — ED Notes (Signed)
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  The patient denies SI and HI.  She says the last time she did heroin was last night at about 2000.  She advises she is tired, has  Nowhere to go and has a four year old.  She is asking for help.  She has diarrhea, nausea, vomiting and chills.  She has a headache and rates it 10/10.

## 2015-05-01 ENCOUNTER — Emergency Department (HOSPITAL_COMMUNITY): Payer: 59

## 2015-05-01 ENCOUNTER — Emergency Department (HOSPITAL_COMMUNITY)
Admission: EM | Admit: 2015-05-01 | Discharge: 2015-05-02 | Disposition: A | Discharge status: Home / Self Care | Payer: 59 | Attending: Emergency Medicine | Admitting: Emergency Medicine

## 2015-05-01 ENCOUNTER — Encounter (HOSPITAL_COMMUNITY): Payer: Self-pay | Admitting: Adult Health

## 2015-05-01 DIAGNOSIS — Z880 Allergy status to penicillin: Secondary | ICD-10-CM | POA: Insufficient documentation

## 2015-05-01 DIAGNOSIS — R6883 Chills (without fever): Secondary | ICD-10-CM | POA: Diagnosis not present

## 2015-05-01 DIAGNOSIS — M79601 Pain in right arm: Secondary | ICD-10-CM

## 2015-05-01 DIAGNOSIS — M79661 Pain in right lower leg: Secondary | ICD-10-CM | POA: Insufficient documentation

## 2015-05-01 DIAGNOSIS — F419 Anxiety disorder, unspecified: Secondary | ICD-10-CM | POA: Diagnosis not present

## 2015-05-01 DIAGNOSIS — R531 Weakness: Secondary | ICD-10-CM | POA: Insufficient documentation

## 2015-05-01 DIAGNOSIS — F17210 Nicotine dependence, cigarettes, uncomplicated: Secondary | ICD-10-CM | POA: Diagnosis not present

## 2015-05-01 DIAGNOSIS — M79604 Pain in right leg: Secondary | ICD-10-CM

## 2015-05-01 DIAGNOSIS — Z791 Long term (current) use of non-steroidal anti-inflammatories (NSAID): Secondary | ICD-10-CM | POA: Diagnosis not present

## 2015-05-01 DIAGNOSIS — M545 Low back pain, unspecified: Secondary | ICD-10-CM

## 2015-05-01 DIAGNOSIS — M7989 Other specified soft tissue disorders: Secondary | ICD-10-CM | POA: Insufficient documentation

## 2015-05-01 DIAGNOSIS — M79621 Pain in right upper arm: Secondary | ICD-10-CM | POA: Diagnosis not present

## 2015-05-01 MED ORDER — IBUPROFEN 400 MG PO TABS
800.0000 mg | ORAL_TABLET | Freq: Once | ORAL | Status: AC
  Administered 2015-05-01: 800 mg via ORAL
  Filled 2015-05-01: qty 2
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
## 2015-05-02 MED ORDER — METHOCARBAMOL 500 MG PO TABS: 500.0000 mg | ORAL_TABLET | Freq: Two times a day (BID) | ORAL | Status: DC

## 2015-05-02 NOTE — Discharge Instructions (Signed)
Schedule an appointment with your PCP if your symptoms do not improve.    Back Pain, Adult Back pain is very common in adults.The cause of back pain is rarely dangerous and the pain often gets better over time.The cause of your back pain may not be known. Some common causes of back pain include:  Strain of the muscles or ligaments supporting the spine.  Wear and tear (degeneration) of the spinal disks.  Arthritis.  Direct injury to the back. For many people, back pain may return. Since back pain is rarely dangerous, most people can learn to manage this condition on their own. HOME CARE INSTRUCTIONS Watch your back pain for any changes. The following actions may help to lessen any discomfort you are feeling:  Remain active. It is stressful on your back to sit or stand in one place for long periods of time. Do not sit, drive, or stand in one place for more than 30 minutes at a time. Take short walks on even surfaces as soon as you are able.Try to increase the length of time you walk each day.  Exercise regularly as directed by your health care provider. Exercise helps your back heal faster. It also helps avoid future injury by keeping your muscles strong and flexible.  Do not stay in bed.Resting more than 1-2 days can delay your recovery.  Pay attention to your body when you bend and lift. The most comfortable positions are those that put less stress on your recovering back. Always use proper lifting techniques, including:  Bending your knees.  Keeping the load close to your body.  Avoiding twisting.  Find a comfortable position to sleep. Use a firm mattress and lie on your side with your knees slightly bent. If you lie on your back, put a pillow under your knees.  Avoid feeling anxious or stressed.Stress increases muscle tension and can worsen back pain.It is important to recognize when you are anxious or stressed and learn ways to manage it, such as with exercise.  Take  medicines only as directed by your health care provider. Over-the-counter medicines to reduce pain and inflammation are often the most helpful.Your health care provider may prescribe muscle relaxant drugs.These medicines help dull your pain so you can more quickly return to your normal activities and healthy exercise.  Apply ice to the injured area:  Put ice in a plastic bag.  Place a towel between your skin and the bag.  Leave the ice on for 20 minutes, 2-3 times a day for the first 2-3 days. After that, ice and heat may be alternated to reduce pain and spasms.  Maintain a healthy weight. Excess weight puts extra stress on your back and makes it difficult to maintain good posture. SEEK MEDICAL CARE IF:  You have pain that is not relieved with rest or medicine.  You have increasing pain going down into the legs or buttocks.  You have pain that does not improve in one week.  You have night pain.  You lose weight.  You have a fever or chills. SEEK IMMEDIATE MEDICAL CARE IF:  1. You develop new bowel or bladder control problems. 2. You have unusual weakness or numbness in your arms or legs. 3. You develop nausea or vomiting. 4. You develop abdominal pain. 5. You feel faint.   This information is not intended to replace advice given to you by your health care provider. Make sure you discuss any questions you have with your health care provider.  Document Released: 03/18/2005 Document Revised: 04/08/2014 Document Reviewed: 07/20/2013 Elsevier Interactive Patient Education 2016 [employer] Inc[employer]   Back Exercises The following exercises strengthen the muscles that help to support the back. They also help to keep the lower back flexible. Doing these exercises can help to prevent back pain or lessen existing pain. If you have back pain or discomfort, try doing these exercises 2-3 times each day or as told by your health care provider. When the pain goes away, do them once each day, but  increase the number of times that you repeat the steps for each exercise (do more repetitions). If you do not have back pain or discomfort, do these exercises once each day or as told by your health care provider. EXERCISES Single Knee to Chest Repeat these steps 3-5 times for each leg:  Lie on your back on a firm bed or the floor with your legs extended.  Bring one knee to your chest. Your other leg should stay extended and in contact with the floor.  Hold your knee in place by grabbing your knee or thigh.  Pull on your knee until you feel a gentle stretch in your lower back.  Hold the stretch for 10-30 seconds.  Slowly release and straighten your leg. Pelvic Tilt Repeat these steps 5-10 times:  Lie on your back on a firm bed or the floor with your legs extended.  Bend your knees so they are pointing toward the ceiling and your feet are flat on the floor.  Tighten your lower abdominal muscles to press your lower back against the floor. This motion will tilt your pelvis so your tailbone points up toward the ceiling instead of pointing to your feet or the floor.  With gentle tension and even breathing, hold this position for 5-10 seconds. Cat-Cow Repeat these steps until your lower back becomes more flexible:  Get into a hands-and-knees position on a firm surface. Keep your hands under your shoulders, and keep your knees under your hips. You may place padding under your knees for comfort.  Let your head hang down, and point your tailbone toward the floor so your lower back becomes rounded like the back of a cat.  Hold this position for 5 seconds.  Slowly lift your head and point your tailbone up toward the ceiling so your back forms a sagging arch like the back of a cow.  Hold this position for 5 seconds. Press-Ups Repeat these steps 5-10 times: 6. Lie on your abdomen (face-down) on the floor. 7. Place your palms near your head, about shoulder-width apart. 8. While you keep  your back as relaxed as possible and keep your hips on the floor, slowly straighten your arms to raise the top half of your body and lift your shoulders. Do not use your back muscles to raise your upper torso. You may adjust the placement of your hands to make yourself more comfortable. 9. Hold this position for 5 seconds while you keep your back relaxed. 10. Slowly return to lying flat on the floor. Bridges Repeat these steps 10 times: 1. Lie on your back on a firm surface. 2. Bend your knees so they are pointing toward the ceiling and your feet are flat on the floor. 3. Tighten your buttocks muscles and lift your buttocks off of the floor until your waist is at almost the same height as your knees. You should feel the muscles working in your buttocks and the back of your thighs. If you do not  feel these muscles, slide your feet 1-2 inches farther away from your buttocks. 4. Hold this position for 3-5 seconds. 5. Slowly lower your hips to the starting position, and allow your buttocks muscles to relax completely. If this exercise is too easy, try doing it with your arms crossed over your chest. Abdominal Crunches Repeat these steps 5-10 times: 1. Lie on your back on a firm bed or the floor with your legs extended. 2. Bend your knees so they are pointing toward the ceiling and your feet are flat on the floor. 3. Cross your arms over your chest. 4. Tip your chin slightly toward your chest without bending your neck. 5. Tighten your abdominal muscles and slowly raise your trunk (torso) high enough to lift your shoulder blades a tiny bit off of the floor. Avoid raising your torso higher than that, because it can put too much stress on your low back and it does not help to strengthen your abdominal muscles. 6. Slowly return to your starting position. Back Lifts Repeat these steps 5-10 times: 1. Lie on your abdomen (face-down) with your arms at your sides, and rest your forehead on the  floor. 2. Tighten the muscles in your legs and your buttocks. 3. Slowly lift your chest off of the floor while you keep your hips pressed to the floor. Keep the back of your head in line with the curve in your back. Your eyes should be looking at the floor. 4. Hold this position for 3-5 seconds. 5. Slowly return to your starting position. SEEK MEDICAL CARE IF:  Your back pain or discomfort gets much worse when you do an exercise.  Your back pain or discomfort does not lessen within 2 hours after you exercise. If you have any of these problems, stop doing these exercises right away. Do not do them again unless your health care provider says that you can. SEEK IMMEDIATE MEDICAL CARE IF:  You develop sudden, severe back pain. If this happens, stop doing the exercises right away. Do not do them again unless your health care provider says that you can.   This information is not intended to replace advice given to you by your health care provider. Make sure you discuss any questions you have with your health care provider.   Document Released: 04/25/2004 Document Revised: 12/07/2014 Document Reviewed: 05/12/2014 Elsevier Interactive Patient Education 2016 [employer] Inc.  Musculoskeletal Pain Musculoskeletal pain is muscle and boney aches and pains. These pains can occur in any part of the body. Your caregiver may treat you without knowing the cause of the pain. They may treat you if blood or urine tests, X-rays, and other tests were normal.  CAUSES There is often not a definite cause or reason for these pains. These pains may be caused by a type of germ (virus). The discomfort may also come from overuse. Overuse includes working out too hard when your body is not fit. Boney aches also come from weather changes. Bone is sensitive to atmospheric pressure changes. HOME CARE INSTRUCTIONS   Ask when your test results will be ready. Make sure you get your test results.  Only take over-the-counter or  prescription medicines for pain, discomfort, or fever as directed by your caregiver. If you were given medications for your condition, do not drive, operate machinery or power tools, or sign legal documents for 24 hours. Do not drink alcohol. Do not take sleeping pills or other medications that may interfere with treatment.  Continue all activities unless the  activities cause more pain. When the pain lessens, slowly resume normal activities. Gradually increase the intensity and duration of the activities or exercise.  During periods of severe pain, bed rest may be helpful. Lay or sit in any position that is comfortable.  Putting ice on the injured area.  Put ice in a bag.  Place a towel between your skin and the bag.  Leave the ice on for 15 to 20 minutes, 3 to 4 times a day.  Follow up with your caregiver for continued problems and no reason can be found for the pain. If the pain becomes worse or does not go away, it may be necessary to repeat tests or do additional testing. Your caregiver may need to look further for a possible cause. SEEK IMMEDIATE MEDICAL CARE IF:  You have pain that is getting worse and is not relieved by medications.  You develop chest pain that is associated with shortness or breath, sweating, feeling sick to your stomach (nauseous), or throw up (vomit).  Your pain becomes localized to the abdomen.  You develop any new symptoms that seem different or that concern you. MAKE SURE YOU:   Understand these instructions.  Will watch your condition.  Will get help right away if you are not doing well or get worse.   This information is not intended to replace advice given to you by your health care provider. Make sure you discuss any questions you have with your health care provider.   Document Released: 03/18/2005 Document Revised: 06/10/2011 Document Reviewed: 11/20/2012 Elsevier Interactive Patient Education [employer]

## 2015-05-02 NOTE — ED Notes (Signed)
Pt stable, ambulatory, states understanding of discharge instructions 

## 2016-08-15 ENCOUNTER — Encounter (HOSPITAL_COMMUNITY): Payer: Self-pay | Admitting: Emergency Medicine

## 2016-08-15 ENCOUNTER — Emergency Department (HOSPITAL_COMMUNITY)
Admission: EM | Admit: 2016-08-15 | Discharge: 2016-08-15 | Disposition: A | Discharge status: Home / Self Care | Payer: Medicaid Other | Attending: Emergency Medicine | Admitting: Emergency Medicine

## 2016-08-15 ENCOUNTER — Emergency Department (HOSPITAL_COMMUNITY): Payer: Medicaid Other

## 2016-08-15 DIAGNOSIS — Y939 Activity, unspecified: Secondary | ICD-10-CM | POA: Insufficient documentation

## 2016-08-15 DIAGNOSIS — F17210 Nicotine dependence, cigarettes, uncomplicated: Secondary | ICD-10-CM | POA: Diagnosis not present

## 2016-08-15 DIAGNOSIS — Y999 Unspecified external cause status: Secondary | ICD-10-CM | POA: Insufficient documentation

## 2016-08-15 DIAGNOSIS — X501XXA Overexertion from prolonged static or awkward postures, initial encounter: Secondary | ICD-10-CM | POA: Diagnosis not present

## 2016-08-15 DIAGNOSIS — M79672 Pain in left foot: Secondary | ICD-10-CM

## 2016-08-15 DIAGNOSIS — S99922A Unspecified injury of left foot, initial encounter: Secondary | ICD-10-CM | POA: Diagnosis present

## 2016-08-15 DIAGNOSIS — Y929 Unspecified place or not applicable: Secondary | ICD-10-CM | POA: Insufficient documentation

## 2016-08-15 DIAGNOSIS — S9032XA Contusion of left foot, initial encounter: Secondary | ICD-10-CM | POA: Diagnosis not present

## 2016-08-15 MED ORDER — IBUPROFEN 800 MG PO TABS: 800.0000 mg | ORAL_TABLET | Freq: Three times a day (TID) | ORAL | 0 refills | Status: DC

## 2016-08-15 MED ORDER — ACETAMINOPHEN 500 MG PO TABS
1000.0000 mg | ORAL_TABLET | Freq: Once | ORAL | Status: AC
  Administered 2016-08-15: 1000 mg via ORAL
  Filled 2016-08-15: qty 2

## 2016-08-15 NOTE — ED Notes (Signed)
Patient ambulated without assistance to restroom. No limp or difficulty noted when ambulating. No distress noted.

## 2016-08-15 NOTE — Discharge Instructions (Signed)
Medications: ibuprofen  Treatment: You can take ibuprofen every 8 hours as needed for your pain. You can alternate with Tylenol as prescribed over-the-counter as needed for your pain. Wear brace at all times except when bathing. Elevate your foot whenever you're not walking on it. Use crutches as much as possible to ambulate and begin partial weightbearing as tolerated. Use ice 3-4 time 20 minutes on, 20 minutes off.  Follow-up: Please follow-up with Dr. Logan BoresEvans, a foot and ankle doctor, for further evaluation and treatment if your symptoms are not improving over the next 7-10 days.

## 2016-08-15 NOTE — ED Triage Notes (Signed)
Pt sts twisted left foot while falling yesterday; pt c/o pain and swelling

## 2016-08-15 NOTE — ED Provider Notes (Signed)
[facility]-EMERGENCY DEPT Provider Note   CSN: [id number] Arrival date & time: 08/15/16  1052  By signing my name below, I, Sonum Patel, attest that this documentation has been prepared under the direction and in the presence of [employer]. Electronically Signed: Sonum Patel, Neurosurgeon. 08/15/16. 1:33 PM.  History   Chief Complaint Chief Complaint  Patient presents with  . Foot Pain    The history is provided by the patient. No language interpreter was used.    HPI Comments: Patty Mata is a 26 y.o. female who presents to the Emergency Department complaining of a left foot injury that occurred yesterday. Patient states she twisted the affected area when she fell. She denies head injury or LOC. She currently complains of constant, unchanged left foot pain that is worse with movement. She has associated bruising and swelling. She describes her pain as throbbing in nature. She denies numbness, weakness. She has taken ibuprofen without relief. She has been ambulatory since the injury.  Past Medical History:  Diagnosis Date  . Anxiety     There are no active problems to display for this patient.   History reviewed. No pertinent surgical history.  OB History    No data available       Home Medications    Prior to Admission medications   Medication Sig Start Date End Date Taking? Authorizing Provider  cloNIDine (CATAPRES) 0.1 MG tablet 0.1 mg  4 times a day for 2 days. 0.1 mg 2 times a day for 2 days 0.1mg  in the morning for 2 days. 08/23/14   Arthor Captain, PA-C  cyclobenzaprine (FLEXERIL) 10 MG tablet Take 1 tablet (10 mg total) by mouth 3 (three) times daily as needed for muscle spasms. 08/23/14   Arthor Captain, PA-C  dicyclomine (BENTYL) 20 MG tablet Take 1 tablet (20 mg total) by mouth every 6 (six) hours. 08/23/14   Arthor Captain, PA-C  HYDROcodone-acetaminophen (NORCO) 5-325 MG per tablet Take 1 tablet by mouth 2 (two) times daily as needed for severe pain. Patient  not taking: Reported on 08/23/2014 03/19/14   Tomasita Crumble, MD  hydrOXYzine (ATARAX/VISTARIL) 25 MG tablet Take 1 tablet (25 mg total) by mouth every 6 (six) hours. 08/23/14   Arthor Captain, PA-C  ibuprofen (ADVIL,MOTRIN) 800 MG tablet Take 1 tablet (800 mg total) by mouth 3 (three) times daily. 08/15/16   Lida Berkery, Waylan Boga, PA-C  methocarbamol (ROBAXIN) 500 MG tablet Take 1 tablet (500 mg total) by mouth 2 (two) times daily. 05/02/15   Barrett, Rolm Gala, PA-C  naproxen (NAPROSYN) 500 MG tablet Take 1 tablet (500 mg total) by mouth 2 (two) times daily with a meal. 08/23/14   Harris, Abigail, PA-C  ondansetron (ZOFRAN) 4 MG tablet Take 1 tablet (4 mg total) by mouth every 8 (eight) hours as needed for nausea or vomiting. 08/23/14   Arthor Captain, PA-C  oxyCODONE (ROXICODONE) 5 MG immediate release tablet Take 1 tablet (5 mg total) by mouth 2 (two) times daily as needed for severe pain. Patient not taking: Reported on 08/23/2014 03/19/14   Tomasita Crumble, MD    Family History Family History  Problem Relation Age of Onset  . Depression Mother   . Arthritis Mother   . Asthma Father   . Mental illness Father     Social History Social History  Substance Use Topics  . Smoking status: Current Every Day Smoker    Packs/day: 0.25    Years: 5.00    Types: Cigarettes  . Smokeless  tobacco: Never Used  . Alcohol use Yes     Comment: Socially     Allergies   Penicillins   Review of Systems Review of Systems  Musculoskeletal: Positive for arthralgias and joint swelling.  Skin: Positive for color change (bruising).     Physical Exam Updated Vital Signs BP 121/73 (BP Location: Left Arm)   Pulse (!) 106   Temp 98.8 F (37.1 C) (Oral)   Resp 18   SpO2 97%   Physical Exam  Constitutional: She appears well-developed and well-nourished. No distress.  HENT:  Head: Normocephalic and atraumatic.  Mouth/Throat: Oropharynx is clear and moist. No oropharyngeal exudate.  Eyes: Conjunctivae are  normal. Pupils are equal, round, and reactive to light. Right eye exhibits no discharge. Left eye exhibits no discharge. No scleral icterus.  Neck: Normal range of motion. Neck supple. No thyromegaly present.  Cardiovascular: Normal rate, regular rhythm, normal heart sounds and intact distal pulses.  Exam reveals no gallop and no friction rub.   No murmur heard. Pulmonary/Chest: Effort normal and breath sounds normal. No stridor. No respiratory distress. She has no wheezes. She has no rales.  Abdominal: Soft. Bowel sounds are normal. She exhibits no distension. There is no tenderness. There is no rebound and no guarding.  Musculoskeletal: She exhibits edema and tenderness. She exhibits no deformity.  Mild ecchymosis and edema to dorsal aspect of left foot distal to ankle. No malleolar tenderness but tenderness inferior to lateral malleolus. Tenderness to base of 5th metatarsal. No proximal fibular tenderness. ROM with plantar and dorsiflexion limited due to pain but is intact. DP pulses intact. Sensation intact. Cap refill intact.   Lymphadenopathy:    She has no cervical adenopathy.  Neurological: She is alert. Coordination normal.  Skin: Skin is warm and dry. No rash noted. She is not diaphoretic. No pallor.  Psychiatric: She has a normal mood and affect.  Nursing note and vitals reviewed.    ED Treatments / Results  DIAGNOSTIC STUDIES: Oxygen Saturation is 100% on RA, normal by my interpretation.    COORDINATION OF CARE: 11:32 AM Discussed treatment plan with pt at bedside and pt agreed to plan.   Labs (all labs ordered are listed, but only abnormal results are displayed) Labs Reviewed - No data to display  EKG  EKG Interpretation None       Radiology Dg Ankle Complete Left  Result Date: 08/15/2016 CLINICAL DATA:  Left ankle pain after fall yesterday. EXAM: LEFT ANKLE COMPLETE - 3+ VIEW COMPARISON:  None. FINDINGS: There is no evidence of fracture, dislocation, or joint  effusion. There is no evidence of arthropathy or other focal bone abnormality. Soft tissues are unremarkable. IMPRESSION: Normal left ankle. Electronically Signed   By: Lupita RaiderJames  Green Jr, M.D.   On: 08/15/2016 12:11   Dg Foot Complete Left  Result Date: 08/15/2016 CLINICAL DATA:  Left foot pain after fall yesterday. EXAM: LEFT FOOT - COMPLETE 3+ VIEW COMPARISON:  None. FINDINGS: There is no evidence of fracture or dislocation. There is no evidence of arthropathy or other focal bone abnormality. Soft tissues are unremarkable. IMPRESSION: Normal left foot. Electronically Signed   By: Lupita RaiderJames  Green Jr, M.D.   On: 08/15/2016 12:10    Procedures Procedures (including critical care time)  Medications Ordered in ED Medications  acetaminophen (TYLENOL) tablet 1,000 mg (1,000 mg Oral Given 08/15/16 1146)     Initial Impression / Assessment and Plan / ED Course  I have reviewed the triage vital signs and the  nursing notes.  Pertinent labs & imaging results that were available during my care of the patient were reviewed by me and considered in my medical decision making (see chart for details).     Patient X-Ray negative for obvious fracture or dislocation. Pain managed in ED with Tylenol.  Home Care discussed: Rest and elevate the injured ankle, apply ice intermittently. Use crutches without weight bearing until able to comfortable bear partial weight, then progress to full weight bearing as tolerated. ASO Splint applied. See ortho prn.  Patient will be dc home & is agreeable with above plan.   Final Clinical Impressions(s) / ED Diagnoses   Final diagnoses:  Foot pain, left    New Prescriptions Discharge Medication List as of 08/15/2016 12:50 PM     I personally performed the services described in this documentation, which was scribed in my presence. The recorded information has been reviewed and is accurate.    Emi Holes, PA-C 08/15/16 1334    Benjiman Core, MD 08/15/16  [phone]

## 2016-08-15 NOTE — ED Notes (Signed)
See EDP secondary assessment.  

## 2016-08-15 NOTE — ED Notes (Signed)
Patient transported to X-ray 

## 2016-08-15 NOTE — ED Notes (Signed)
Although patient provided crutches prior to discharge & instructed on use, patient refused to use crutches while ambulating through ED at time of discharge. Patient carried crutches while ambulating. No acute distress noted at discharge.

## 2016-11-17 ENCOUNTER — Encounter (HOSPITAL_COMMUNITY): Payer: Self-pay | Admitting: Emergency Medicine

## 2016-11-17 ENCOUNTER — Emergency Department (HOSPITAL_COMMUNITY)
Admission: EM | Admit: 2016-11-17 | Discharge: 2016-11-17 | Disposition: A | Discharge status: Home / Self Care | Payer: Medicaid Other | Attending: Emergency Medicine | Admitting: Emergency Medicine

## 2016-11-17 DIAGNOSIS — R112 Nausea with vomiting, unspecified: Secondary | ICD-10-CM | POA: Insufficient documentation

## 2016-11-17 DIAGNOSIS — Z5321 Procedure and treatment not carried out due to patient leaving prior to being seen by health care provider: Secondary | ICD-10-CM | POA: Diagnosis not present

## 2016-11-17 HISTORY — DX: Calculus of kidney: N20.0

## 2016-11-17 LAB — COMPREHENSIVE METABOLIC PANEL
ALBUMIN: 3.9 g/dL (ref 3.5–5.0)
ALT: 13 U/L — ABNORMAL LOW (ref 14–54)
AST: 19 U/L (ref 15–41)
Alkaline Phosphatase: 63 U/L (ref 38–126)
Anion gap: 9 (ref 5–15)
BUN: 12 mg/dL (ref 6–20)
CO2: 22 mmol/L (ref 22–32)
Calcium: 8.9 mg/dL (ref 8.9–10.3)
Chloride: 108 mmol/L (ref 101–111)
Creatinine, Ser: 0.93 mg/dL (ref 0.44–1.00)
GFR calc Af Amer: >60 mL/min (ref >60)
GFR calc non Af Amer: >60 mL/min (ref >60)
GLUCOSE: 147 mg/dL — AB (ref 65–99)
Potassium: 3.2 mmol/L — ABNORMAL LOW (ref 3.5–5.1)
SODIUM: 139 mmol/L (ref 135–145)
Total Bilirubin: 0.5 mg/dL (ref 0.3–1.2)
Total Protein: 6.7 g/dL (ref 6.5–8.1)

## 2016-11-17 LAB — CBC
HCT: 46.4 % — ABNORMAL HIGH (ref 36.0–46.0)
HEMOGLOBIN: 15.8 g/dL — AB (ref 12.0–15.0)
MCH: 31.3 pg (ref 26.0–34.0)
MCHC: 34.1 g/dL (ref 30.0–36.0)
MCV: 91.9 fL (ref 78.0–100.0)
Platelets: 204 10*3/uL (ref 150–400)
RBC: 5.05 MIL/uL (ref 3.87–5.11)
RDW: 13.4 % (ref 11.5–15.5)
WBC: 11.4 10*3/uL — ABNORMAL HIGH (ref 4.0–10.5)

## 2016-11-17 LAB — I-STAT BETA HCG BLOOD, ED (MC, WL, AP ONLY): I-stat hCG, quantitative: <5 m[IU]/mL (ref <5)

## 2016-11-17 LAB — LIPASE, BLOOD: LIPASE: 21 U/L (ref 11–51)

## 2016-11-17 MED ORDER — ONDANSETRON 4 MG PO TBDP
4.0000 mg | ORAL_TABLET | Freq: Once | ORAL | Status: AC | PRN
  Administered 2016-11-17: 4 mg via ORAL
  Filled 2016-11-17: qty 1

## 2016-11-17 NOTE — ED Triage Notes (Signed)
Pt comes in with complaints of nausea and vomiting that began a few days ago.  Pt reports having a normal period in June but does not recall a period in July.  States having unprotected sex in June.  Has brown spotting this month but it is not like a normal period. Reports taken a pregnancy test a few weeks ago that was negative.

## 2016-11-17 NOTE — ED Notes (Signed)
Pt called for room. No answer- not in lobby.

## 2016-11-17 NOTE — ED Notes (Signed)
Called pt. In lobby to take back to room no answer.

## 2016-12-09 ENCOUNTER — Emergency Department (HOSPITAL_COMMUNITY)
Admission: EM | Admit: 2016-12-09 | Discharge: 2016-12-09 | Disposition: A | Discharge status: Home / Self Care | Payer: Medicaid Other | Attending: Emergency Medicine | Admitting: Emergency Medicine

## 2016-12-09 ENCOUNTER — Encounter (HOSPITAL_COMMUNITY): Payer: Self-pay | Admitting: Nurse Practitioner

## 2016-12-09 DIAGNOSIS — Z711 Person with feared health complaint in whom no diagnosis is made: Secondary | ICD-10-CM | POA: Diagnosis not present

## 2016-12-09 DIAGNOSIS — N926 Irregular menstruation, unspecified: Secondary | ICD-10-CM | POA: Diagnosis present

## 2016-12-09 DIAGNOSIS — F17210 Nicotine dependence, cigarettes, uncomplicated: Secondary | ICD-10-CM | POA: Insufficient documentation

## 2016-12-09 DIAGNOSIS — Z79899 Other long term (current) drug therapy: Secondary | ICD-10-CM | POA: Insufficient documentation

## 2016-12-09 LAB — URINALYSIS, ROUTINE W REFLEX MICROSCOPIC
Bilirubin Urine: NEGATIVE
Glucose, UA: NEGATIVE mg/dL
Ketones, ur: 20 mg/dL — AB
Nitrite: NEGATIVE
Protein, ur: 30 mg/dL — AB
Specific Gravity, Urine: 1.023 (ref 1.005–1.030)
pH: 5 (ref 5.0–8.0)

## 2016-12-09 LAB — CBC
HCT: 40.5 % (ref 36.0–46.0)
Hemoglobin: 13.3 g/dL (ref 12.0–15.0)
MCH: 30.3 pg (ref 26.0–34.0)
MCHC: 32.8 g/dL (ref 30.0–36.0)
MCV: 92.3 fL (ref 78.0–100.0)
Platelets: 207 10*3/uL (ref 150–400)
RBC: 4.39 MIL/uL (ref 3.87–5.11)
RDW: 13.7 % (ref 11.5–15.5)
WBC: 5.9 10*3/uL (ref 4.0–10.5)

## 2016-12-09 LAB — COMPREHENSIVE METABOLIC PANEL
ALT: 27 U/L (ref 14–54)
AST: 22 U/L (ref 15–41)
Albumin: 4.5 g/dL (ref 3.5–5.0)
Alkaline Phosphatase: 76 U/L (ref 38–126)
Anion gap: 10 (ref 5–15)
BUN: 11 mg/dL (ref 6–20)
CO2: 23 mmol/L (ref 22–32)
Calcium: 9.4 mg/dL (ref 8.9–10.3)
Chloride: 105 mmol/L (ref 101–111)
Creatinine, Ser: 0.75 mg/dL (ref 0.44–1.00)
GFR calc Af Amer: >60 mL/min (ref >60)
GFR calc non Af Amer: >60 mL/min (ref >60)
Glucose, Bld: 95 mg/dL (ref 65–99)
Potassium: 3.3 mmol/L — ABNORMAL LOW (ref 3.5–5.1)
Sodium: 138 mmol/L (ref 135–145)
Total Bilirubin: 0.9 mg/dL (ref 0.3–1.2)
Total Protein: 7.5 g/dL (ref 6.5–8.1)

## 2016-12-09 LAB — I-STAT BETA HCG BLOOD, ED (MC, WL, AP ONLY): I-stat hCG, quantitative: <5 m[IU]/mL (ref <5)

## 2016-12-09 MED ORDER — ONDANSETRON 4 MG PO TBDP
ORAL_TABLET | ORAL | Status: AC
  Filled 2016-12-09: qty 1

## 2016-12-09 MED ORDER — ONDANSETRON 4 MG PO TBDP
4.0000 mg | ORAL_TABLET | Freq: Once | ORAL | Status: AC | PRN
  Administered 2016-12-09: 4 mg via ORAL

## 2016-12-09 NOTE — ED Triage Notes (Signed)
Pt presents with c/o vaginal bleeding. The bleeding began about 2 days ago, initially was light brown spotting but has become heavier today. She has not had a normal period in about 3 months. She did have some vaginal spotting about 1 month ago, [facility] after her blood work was drawn before seeing a provider. She took two positive pregnancy tests at home this week and has been nauseated all week.

## 2016-12-09 NOTE — ED Notes (Signed)
Pt states  She started to have  Vag bleeding last night states last , has only put in one tampon today she states ,  States took 2 preg tests and they were positive, [facility] but could not stay

## 2016-12-09 NOTE — ED Provider Notes (Signed)
[facility]-EMERGENCY DEPT Provider Note   CSN: [id number] Arrival date & time: 12/09/16  1113     History   Chief Complaint Chief Complaint  Patient presents with  . Vaginal Bleeding    HPI Patty Mata is a 26 y.o. female.  HPI   26 year old female presenting essentially because she thinks she is pregnant. She reports 2 home pregnancy tests which were positive. Irregular periods for the last few months. Began having some vaginal bleeding light brown spotting beginning 2 days ago. No abdominal or back pain. No specific urinary complaints. Feels nauseated.  Past Medical History:  Diagnosis Date  . Anxiety   . Kidney stones     There are no active problems to display for this patient.   Past Surgical History:  Procedure Laterality Date  . STENT PLACEMENT NON-VASCULAR ([facility] HX)     renal    OB History    No data available       Home Medications    Prior to Admission medications   Medication Sig Start Date End Date Taking? Authorizing Provider  cloNIDine (CATAPRES) 0.1 MG tablet 0.1 mg  4 times a day for 2 days. 0.1 mg 2 times a day for 2 days 0.1mg  in the morning for 2 days. 08/23/14   Arthor Captain, PA-C  cyclobenzaprine (FLEXERIL) 10 MG tablet Take 1 tablet (10 mg total) by mouth 3 (three) times daily as needed for muscle spasms. 08/23/14   Arthor Captain, PA-C  dicyclomine (BENTYL) 20 MG tablet Take 1 tablet (20 mg total) by mouth every 6 (six) hours. 08/23/14   Arthor Captain, PA-C  HYDROcodone-acetaminophen (NORCO) 5-325 MG per tablet Take 1 tablet by mouth 2 (two) times daily as needed for severe pain. Patient not taking: Reported on 08/23/2014 03/19/14   Tomasita Crumble, MD  hydrOXYzine (ATARAX/VISTARIL) 25 MG tablet Take 1 tablet (25 mg total) by mouth every 6 (six) hours. 08/23/14   Arthor Captain, PA-C  ibuprofen (ADVIL,MOTRIN) 800 MG tablet Take 1 tablet (800 mg total) by mouth 3 (three) times daily. 08/15/16   Law, Waylan Boga, PA-C  methocarbamol (ROBAXIN) 500  MG tablet Take 1 tablet (500 mg total) by mouth 2 (two) times daily. 05/02/15   Barrett, Rolm Gala, PA-C  naproxen (NAPROSYN) 500 MG tablet Take 1 tablet (500 mg total) by mouth 2 (two) times daily with a meal. 08/23/14   Harris, Abigail, PA-C  ondansetron (ZOFRAN) 4 MG tablet Take 1 tablet (4 mg total) by mouth every 8 (eight) hours as needed for nausea or vomiting. 08/23/14   Arthor Captain, PA-C  oxyCODONE (ROXICODONE) 5 MG immediate release tablet Take 1 tablet (5 mg total) by mouth 2 (two) times daily as needed for severe pain. Patient not taking: Reported on 08/23/2014 03/19/14   Tomasita Crumble, MD    Family History Family History  Problem Relation Age of Onset  . Depression Mother   . Arthritis Mother   . Asthma Father   . Mental illness Father     Social History Social History  Substance Use Topics  . Smoking status: Current Every Day Smoker    Packs/day: 0.25    Years: 5.00    Types: Cigarettes  . Smokeless tobacco: Never Used  . Alcohol use Yes     Comment: Socially; denies today     Allergies   Penicillins   Review of Systems Review of Systems  All systems reviewed and negative, other than as noted in HPI.  Physical Exam Updated Vital Signs BP  132/90 (BP Location: Right Arm)   Pulse (!) 111   Temp 97.9 F (36.6 C) (Oral)   Resp 16   LMP 09/08/2016   SpO2 97%   Physical Exam  Constitutional: She appears well-developed and well-nourished. No distress.  HENT:  Head: Normocephalic and atraumatic.  Eyes: Conjunctivae are normal. Right eye exhibits no discharge. Left eye exhibits no discharge.  Neck: Neck supple.  Cardiovascular: Normal rate, regular rhythm and normal heart sounds.  Exam reveals no gallop and no friction rub.   No murmur heard. Pulmonary/Chest: Effort normal and breath sounds normal. No respiratory distress.  Abdominal: Soft. She exhibits no distension. There is no tenderness.  Musculoskeletal: She exhibits no edema or tenderness.  Neurological:  She is alert.  Skin: Skin is warm and dry.  Psychiatric: She has a normal mood and affect. Her behavior is normal. Thought content normal.  Nursing note and vitals reviewed.    ED Treatments / Results  Labs (all labs ordered are listed, but only abnormal results are displayed) Labs Reviewed  COMPREHENSIVE METABOLIC PANEL - Abnormal; Notable for the following:       Result Value   Potassium 3.3 (*)    All other components within normal limits  URINALYSIS, ROUTINE W REFLEX MICROSCOPIC - Abnormal; Notable for the following:    Color, Urine AMBER (*)    APPearance HAZY (*)    Hgb urine dipstick LARGE (*)    Ketones, ur 20 (*)    Protein, ur 30 (*)    Leukocytes, UA SMALL (*)    Bacteria, UA RARE (*)    Squamous Epithelial / LPF 6-30 (*)    All other components within normal limits  CBC  I-STAT BETA HCG BLOOD, ED ([facility], [facility], [facility] ONLY)    EKG  EKG Interpretation None       Radiology No results found.  Procedures Procedures (including critical care time)  Medications Ordered in ED Medications  ondansetron (ZOFRAN-ODT) 4 MG disintegrating tablet (not administered)  ondansetron (ZOFRAN-ODT) disintegrating tablet 4 mg (4 mg Oral Given 12/09/16 1148)     Initial Impression / Assessment and Plan / ED Course  I have reviewed the triage vital signs and the nursing notes.  Pertinent labs & imaging results that were available during my care of the patient were reviewed by me and considered in my medical decision making (see chart for details).     26 year old female is concerned she may be pregnant. She is benign exam. HCG is normal. Reassurance provided. Outpatient follow-up.  Final Clinical Impressions(s) / ED Diagnoses   Final diagnoses:  Concern about unplanned pregnancy without diagnosis    New Prescriptions New Prescriptions   No medications on file     Raeford RazorKohut, Bradd Merlos, MD 12/27/16 1234

## 2016-12-10 ENCOUNTER — Emergency Department (HOSPITAL_COMMUNITY)
Admission: EM | Admit: 2016-12-10 | Discharge: 2016-12-10 | Disposition: A | Discharge status: Home / Self Care | Payer: Medicaid Other | Attending: Emergency Medicine | Admitting: Emergency Medicine

## 2016-12-10 ENCOUNTER — Encounter (HOSPITAL_COMMUNITY): Payer: Self-pay | Admitting: Emergency Medicine

## 2016-12-10 DIAGNOSIS — F17210 Nicotine dependence, cigarettes, uncomplicated: Secondary | ICD-10-CM | POA: Insufficient documentation

## 2016-12-10 DIAGNOSIS — N939 Abnormal uterine and vaginal bleeding, unspecified: Secondary | ICD-10-CM | POA: Diagnosis not present

## 2016-12-10 DIAGNOSIS — R112 Nausea with vomiting, unspecified: Secondary | ICD-10-CM | POA: Diagnosis present

## 2016-12-10 LAB — I-STAT BETA HCG BLOOD, ED (MC, WL, AP ONLY)

## 2016-12-10 NOTE — ED Provider Notes (Signed)
[facility]-EMERGENCY DEPT Provider Note   CSN: [id number] Arrival date & time: 12/10/16  1126     History   Chief Complaint Chief Complaint  Patient presents with  . Vaginal Bleeding  . home pregnancy test positive    HPI Nestor LewandowskyKeely M Bacote is a 26 y.o. G1P1, LMP 11/08/16 but only spotting. Patient presents to the ED with vaginal bleeding. She reports having a positive pregnancy test at home. [facility]. The Bhcg was <5. The patient reports that she just wants to know if she is pregnant or having a miscarriage. She reports having a full work up at [facility] yesterday but wants another test done. The bleeding today is heavier. Patient reports that she repeated the HPT last night and it was still positive.   The history is provided by the patient. No language interpreter was used.  Vaginal Bleeding  Primary symptoms include vaginal bleeding. There has been no fever. This is a new problem. The current episode started yesterday. The problem occurs constantly. Associated symptoms include nausea and vomiting. Abdominal pain: cramping.    Past Medical History:  Diagnosis Date  . Anxiety   . Kidney stones     There are no active problems to display for this patient.   Past Surgical History:  Procedure Laterality Date  . STENT PLACEMENT NON-VASCULAR ([facility] HX)     renal    OB History    No data available       Home Medications    Prior to Admission medications   Medication Sig Start Date End Date Taking? Authorizing Provider  ALPRAZolam Prudy Feeler(XANAX) 1 MG tablet Take 1 mg by mouth 2 (two) times daily as needed for anxiety.   Yes [provider]  gabapentin (NEURONTIN) 300 MG capsule Take 300 mg by mouth 2 (two) times daily. 12/03/16  Yes [provider]  cloNIDine (CATAPRES) 0.1 MG tablet 0.1 mg  4 times a day for 2 days. 0.1 mg 2 times a day for 2 days 0.1mg  in the morning for 2 days. Patient not taking: Reported on 12/10/2016 08/23/14   Arthor CaptainHarris,  Abigail, PA-C  cyclobenzaprine (FLEXERIL) 10 MG tablet Take 1 tablet (10 mg total) by mouth 3 (three) times daily as needed for muscle spasms. Patient not taking: Reported on 12/10/2016 08/23/14   Arthor CaptainHarris, Abigail, PA-C  dicyclomine (BENTYL) 20 MG tablet Take 1 tablet (20 mg total) by mouth every 6 (six) hours. Patient not taking: Reported on 12/10/2016 08/23/14   Arthor CaptainHarris, Abigail, PA-C  HYDROcodone-acetaminophen (NORCO) 5-325 MG per tablet Take 1 tablet by mouth 2 (two) times daily as needed for severe pain. Patient not taking: Reported on 08/23/2014 03/19/14   Tomasita Crumbleni, Adeleke, MD  hydrOXYzine (ATARAX/VISTARIL) 25 MG tablet Take 1 tablet (25 mg total) by mouth every 6 (six) hours. Patient not taking: Reported on 12/10/2016 08/23/14   Arthor CaptainHarris, Abigail, PA-C  ibuprofen (ADVIL,MOTRIN) 800 MG tablet Take 1 tablet (800 mg total) by mouth 3 (three) times daily. Patient not taking: Reported on 12/10/2016 08/15/16   Emi HolesLaw, Alexandra M, PA-C  methocarbamol (ROBAXIN) 500 MG tablet Take 1 tablet (500 mg total) by mouth 2 (two) times daily. Patient not taking: Reported on 12/10/2016 05/02/15   Barrett, Rolm GalaStevi, PA-C  naproxen (NAPROSYN) 500 MG tablet Take 1 tablet (500 mg total) by mouth 2 (two) times daily with a meal. Patient not taking: Reported on 12/10/2016 08/23/14   Arthor CaptainHarris, Abigail, PA-C  ondansetron (ZOFRAN) 4 MG tablet Take 1 tablet (4  mg total) by mouth every 8 (eight) hours as needed for nausea or vomiting. Patient not taking: Reported on 12/10/2016 08/23/14   Arthor Captain, PA-C  oxyCODONE (ROXICODONE) 5 MG immediate release tablet Take 1 tablet (5 mg total) by mouth 2 (two) times daily as needed for severe pain. Patient not taking: Reported on 08/23/2014 03/19/14   Tomasita Crumble, MD    Family History Family History  Problem Relation Age of Onset  . Depression Mother   . Arthritis Mother   . Asthma Father   . Mental illness Father     Social History Social History  Substance Use Topics  . Smoking status:  Current Every Day Smoker    Packs/day: 0.25    Years: 5.00    Types: Cigarettes  . Smokeless tobacco: Never Used  . Alcohol use Yes     Comment: Socially; denies today     Allergies   Penicillins   Review of Systems Review of Systems  Constitutional: Negative for chills and fever.  HENT: Negative.   Gastrointestinal: Positive for nausea and vomiting. Abdominal pain: cramping.  Genitourinary: Positive for vaginal bleeding.  Skin: Negative for rash.     Physical Exam Updated Vital Signs BP (!) 124/97 (BP Location: Left Arm)   Pulse 95   Temp 98 F (36.7 C) (Oral)   Resp 17   SpO2 100%   Physical Exam  Constitutional: She is oriented to person, place, and time. She appears well-developed and well-nourished. No distress.  HENT:  Head: Normocephalic.  Eyes: EOM are normal.  Neck: Neck supple.  Cardiovascular: Normal rate.   Pulmonary/Chest: Effort normal.  Genitourinary:  Genitourinary Comments: Patient declined  Musculoskeletal: Normal range of motion.  Neurological: She is alert and oriented to person, place, and time. No cranial nerve deficit.  Skin: Skin is warm and dry.  Psychiatric: She has a normal mood and affect.  Nursing note and vitals reviewed.    ED Treatments / Results  Labs (all labs ordered are listed, but only abnormal results are displayed) Labs Reviewed  I-STAT BETA HCG BLOOD, ED ([facility], [facility], [facility] ONLY)   Radiology No results found.  Procedures Procedures (including critical care time)  Medications Ordered in ED Medications - No data to display   Initial Impression / Assessment and Plan / ED Course  I have reviewed the triage vital signs and the nursing notes. Final Clinical Impressions(s) / ED Diagnoses  26 y.o. female here for pregnancy test due to +HPT and negative Bhcg at [facility] ED last night. Discussed with the patient test today is negative. Discussed possible reasons for false positive pregnancy test at home. [facility] overdue pap smear and further evaluation.  Final diagnoses:  Vaginal bleeding    New Prescriptions New Prescriptions   No medications on file     Janne Napoleon, NP 12/10/16 1556    Cathren Laine, MD 12/13/16 1218

## 2016-12-10 NOTE — ED Triage Notes (Signed)
Patient states that she took 2 pregnancies test over weekend that were positive and [facility] and blood test was negative but had vaginal bleeding and two more home pregnancies were positive, so patient wants to know what is going on and would like an ultrasound.

## 2017-01-06 ENCOUNTER — Emergency Department: Payer: Medicaid Other

## 2017-01-06 ENCOUNTER — Emergency Department
Admission: EM | Admit: 2017-01-06 | Discharge: 2017-01-06 | Disposition: A | Discharge status: Home / Self Care | Payer: Medicaid Other | Attending: Emergency Medicine | Admitting: Emergency Medicine

## 2017-01-06 ENCOUNTER — Encounter: Payer: Self-pay | Admitting: *Deleted

## 2017-01-06 DIAGNOSIS — F17210 Nicotine dependence, cigarettes, uncomplicated: Secondary | ICD-10-CM | POA: Insufficient documentation

## 2017-01-06 DIAGNOSIS — S63691A Other sprain of left index finger, initial encounter: Secondary | ICD-10-CM | POA: Diagnosis not present

## 2017-01-06 DIAGNOSIS — Z79899 Other long term (current) drug therapy: Secondary | ICD-10-CM | POA: Insufficient documentation

## 2017-01-06 DIAGNOSIS — Y999 Unspecified external cause status: Secondary | ICD-10-CM | POA: Insufficient documentation

## 2017-01-06 DIAGNOSIS — W230XXA Caught, crushed, jammed, or pinched between moving objects, initial encounter: Secondary | ICD-10-CM | POA: Diagnosis not present

## 2017-01-06 DIAGNOSIS — Y939 Activity, unspecified: Secondary | ICD-10-CM | POA: Diagnosis not present

## 2017-01-06 DIAGNOSIS — Y929 Unspecified place or not applicable: Secondary | ICD-10-CM | POA: Insufficient documentation

## 2017-01-06 DIAGNOSIS — S6992XA Unspecified injury of left wrist, hand and finger(s), initial encounter: Secondary | ICD-10-CM | POA: Diagnosis present

## 2017-01-06 NOTE — ED Triage Notes (Signed)
Pt slammed left index finger in door today, finger is swollen and painful

## 2017-01-06 NOTE — Discharge Instructions (Signed)
You may take ibuprofen as needed for pain and inflammation over the next week.  Keep finger immobilized with splinting as we discussed and reviewed until symptoms improve.  Several times a day take the finger out of the splinting material and gently range and move your hand.

## 2017-01-06 NOTE — ED Provider Notes (Signed)
[facility] Emergency Department Provider Note   ____________________________________________   I have reviewed the triage vital signs and the nursing notes.   HISTORY  Chief Complaint Finger Injury    HPI Patty Mata is a 26 y.o. female presents to the emergency department with left index finger pain and swelling after closing her hand and finger in a door. In addition, she reports difficulty flexing and extending her finger. Patient denies loss of sensation or loss of motor control of the left index finger. Patient denies fever, chills, headache, vision changes, chest pain, chest tightness, shortness of breath, abdominal pain, nausea and vomiting.  Past Medical History:  Diagnosis Date  . Anxiety   . Kidney stones     There are no active problems to display for this patient.   Past Surgical History:  Procedure Laterality Date  . STENT PLACEMENT NON-VASCULAR ([facility] HX)     renal    Prior to Admission medications   Medication Sig Start Date End Date Taking? Authorizing Provider  ALPRAZolam Prudy Feeler) 1 MG tablet Take 1 mg by mouth 2 (two) times daily as needed for anxiety.    [provider]  cloNIDine (CATAPRES) 0.1 MG tablet 0.1 mg  4 times a day for 2 days. 0.1 mg 2 times a day for 2 days 0.1mg  in the morning for 2 days. Patient not taking: Reported on 12/10/2016 08/23/14   Arthor Captain, PA-C  cyclobenzaprine (FLEXERIL) 10 MG tablet Take 1 tablet (10 mg total) by mouth 3 (three) times daily as needed for muscle spasms. Patient not taking: Reported on 12/10/2016 08/23/14   Arthor Captain, PA-C  dicyclomine (BENTYL) 20 MG tablet Take 1 tablet (20 mg total) by mouth every 6 (six) hours. Patient not taking: Reported on 12/10/2016 08/23/14   Arthor Captain, PA-C  gabapentin (NEURONTIN) 300 MG capsule Take 300 mg by mouth 2 (two) times daily. 12/03/16   [provider]  HYDROcodone-acetaminophen (NORCO) 5-325 MG per tablet Take 1  tablet by mouth 2 (two) times daily as needed for severe pain. Patient not taking: Reported on 08/23/2014 03/19/14   Tomasita Crumble, MD  hydrOXYzine (ATARAX/VISTARIL) 25 MG tablet Take 1 tablet (25 mg total) by mouth every 6 (six) hours. Patient not taking: Reported on 12/10/2016 08/23/14   Arthor Captain, PA-C  ibuprofen (ADVIL,MOTRIN) 800 MG tablet Take 1 tablet (800 mg total) by mouth 3 (three) times daily. Patient not taking: Reported on 12/10/2016 08/15/16   Emi Holes, PA-C  methocarbamol (ROBAXIN) 500 MG tablet Take 1 tablet (500 mg total) by mouth 2 (two) times daily. Patient not taking: Reported on 12/10/2016 05/02/15   Barrett, Rolm Gala, PA-C  naproxen (NAPROSYN) 500 MG tablet Take 1 tablet (500 mg total) by mouth 2 (two) times daily with a meal. Patient not taking: Reported on 12/10/2016 08/23/14   Arthor Captain, PA-C  ondansetron (ZOFRAN) 4 MG tablet Take 1 tablet (4 mg total) by mouth every 8 (eight) hours as needed for nausea or vomiting. Patient not taking: Reported on 12/10/2016 08/23/14   Arthor Captain, PA-C  oxyCODONE (ROXICODONE) 5 MG immediate release tablet Take 1 tablet (5 mg total) by mouth 2 (two) times daily as needed for severe pain. Patient not taking: Reported on 08/23/2014 03/19/14   Tomasita Crumble, MD    Allergies Penicillins  Family History  Problem Relation Age of Onset  . Depression Mother   . Arthritis Mother   . Asthma Father   . Mental illness Father  Social History Social History  Substance Use Topics  . Smoking status: Current Every Day Smoker    Packs/day: 0.25    Years: 5.00    Types: Cigarettes  . Smokeless tobacco: Never Used  . Alcohol use Yes     Comment: Socially; denies today    Review of Systems Constitutional: Negative for fever/chills Cardiovascular: Denies chest pain. Musculoskeletal: Positive for left index finger pain and swelling. Skin: Negative for rash. Neurological: Negative for headaches.  ____________________________________________   PHYSICAL EXAM:  VITAL SIGNS: ED Triage Vitals  Enc Vitals Group     BP 01/06/17 1035 131/86     Pulse Rate 01/06/17 1033 (!) 9     Resp 01/06/17 1033 20     Temp 01/06/17 1033 98.6 F (37 C)     Temp Source 01/06/17 1033 Oral     SpO2 01/06/17 1033 100 %     Weight 01/06/17 1033 128 lb (58.1 kg)     Height 01/06/17 1033  (1.575 m)     Head Circumference --      Peak Flow --      Pain Score 01/06/17 1033 9     Pain Loc --      Pain Edu? --      Excl. in GC? --     Constitutional: Alert and oriented. Well appearing and in no acute distress.   Cardiovascular: Normal rate, regular rhythm. Respiratory: Normal respiratory effort without tachypnea or retractions.  Musculoskeletal: Left index finger range of motion, strength and sensation intact. Visible swelling noted along PIP and IP joints. No deformities noted. Neurologic: Normal speech and language.  Skin:  Skin is warm, dry and intact. No rash noted. Psychiatric: Mood and affect are normal. Speech and behavior are normal. Patient exhibits appropriate insight and judgement.  ____________________________________________   LABS (all labs ordered are listed, but only abnormal results are displayed)  Labs Reviewed - No data to display ____________________________________________  EKG None ____________________________________________  RADIOLOGY DG can complete left FINDINGS: There is no evidence of fracture or dislocation. There is no evidence of arthropathy or other focal bone abnormality. Soft tissues are unremarkable.  IMPRESSION: Negative. ____________________________________________   PROCEDURES  Procedure(s) performed:  SPLINT APPLICATION Date/Time: 7:41 PM Authorized by: Clois Comber Consent: Verbal consent obtained. Risks and benefits: risks, benefits and alternatives were discussed Consent given by: patient Splint applied by: Treysean Petruzzi,  PA-C Location details: Left hand  Splint type: Prefabricated finger splint, tape  Supplies used: Prefabricated finger splint, tape  Post-procedure: The splinted body part was neurovascularly unchanged following the procedure. Patient tolerance: Patient tolerated the procedure well with no immediate complications.      Critical Care performed: no ____________________________________________   INITIAL IMPRESSION / ASSESSMENT AND PLAN / ED COURSE  Pertinent labs & imaging results that were available during my care of the patient were reviewed by me and considered in my medical decision making (see chart for details).  Patient presents to emergency department with left index finger pain and swelling after traumatic injury. History, physical exam findings and imaging are reassuring symptoms are consistent with left index finger sprain and contusion. Patient noted decreased pain and improve support following splint application during the course of care in the emergency department. Patient advised to follow up with PCP as needed or return to the emergency department if symptoms return or worsen. Patient informed of clinical course, understand medical decision-making process, and agree with plan.  ____________________________________________   FINAL CLINICAL IMPRESSION(S) /  ED DIAGNOSES  Final diagnoses:  Other sprain of left index finger, initial encounter       NEW MEDICATIONS STARTED DURING THIS VISIT:  Discharge Medication List as of 01/06/2017  1:10 PM       Note:  This document was prepared using Dragon voice recognition software and may include unintentional dictation errors.    Clois Comber, PA-C 01/06/17 1941    Minna Antis, MD 01/07/17 1640

## 2017-01-06 NOTE — ED Notes (Signed)
Pt presents with painful left index finger since jamming it a few days ago. She then slammed the finger in a car door this morning. She states that it is throbbing into her wrist and arm. Pt alert & oriented with NAD noted.

## 2017-01-06 NOTE — ED Notes (Signed)
Pt discharged to home.  Family member driving.  Discharge instructions reviewed.  Verbalized understanding.  No questions or concerns at this time.  Teach back verified.  Pt in NAD.  No items left in ED.   

## 2017-02-12 ENCOUNTER — Emergency Department
Admission: EM | Admit: 2017-02-12 | Discharge: 2017-02-12 | Disposition: A | Discharge status: Home / Self Care | Payer: Medicaid Other | Attending: Emergency Medicine | Admitting: Emergency Medicine

## 2017-02-12 ENCOUNTER — Encounter: Payer: Self-pay | Admitting: Emergency Medicine

## 2017-02-12 DIAGNOSIS — Z79899 Other long term (current) drug therapy: Secondary | ICD-10-CM | POA: Insufficient documentation

## 2017-02-12 DIAGNOSIS — F17210 Nicotine dependence, cigarettes, uncomplicated: Secondary | ICD-10-CM | POA: Diagnosis not present

## 2017-02-12 DIAGNOSIS — R1031 Right lower quadrant pain: Secondary | ICD-10-CM | POA: Diagnosis not present

## 2017-02-12 DIAGNOSIS — R109 Unspecified abdominal pain: Secondary | ICD-10-CM

## 2017-02-12 LAB — URINALYSIS, COMPLETE (UACMP) WITH MICROSCOPIC
BACTERIA UA: NONE SEEN
BILIRUBIN URINE: NEGATIVE
Glucose, UA: NEGATIVE mg/dL
HGB URINE DIPSTICK: NEGATIVE
Ketones, ur: 5 mg/dL — AB
LEUKOCYTES UA: NEGATIVE
Nitrite: NEGATIVE
PROTEIN: 30 mg/dL — AB
Specific Gravity, Urine: 1.026 (ref 1.005–1.030)
pH: 5 (ref 5.0–8.0)

## 2017-02-12 LAB — POCT PREGNANCY, URINE: PREG TEST UR: NEGATIVE

## 2017-02-12 MED ORDER — CYCLOBENZAPRINE HCL 10 MG PO TABS: 10.0000 mg | ORAL_TABLET | Freq: Three times a day (TID) | ORAL | 0 refills | Status: DC | PRN

## 2017-02-12 MED ORDER — TRAMADOL HCL 50 MG PO TABS: 50.0000 mg | ORAL_TABLET | Freq: Two times a day (BID) | ORAL | 0 refills | Status: DC | PRN

## 2017-02-12 MED ORDER — IBUPROFEN 600 MG PO TABS: 600.0000 mg | ORAL_TABLET | Freq: Three times a day (TID) | ORAL | 0 refills | Status: DC | PRN

## 2017-02-12 MED ORDER — KETOROLAC TROMETHAMINE 60 MG/2ML IM SOLN
60.0000 mg | Freq: Once | INTRAMUSCULAR | Status: AC
  Administered 2017-02-12: 60 mg via INTRAMUSCULAR
  Filled 2017-02-12: qty 2

## 2017-02-12 NOTE — ED Triage Notes (Signed)
Pt to ED with c/o of lower back pain which is chronic. Pt states she has also had a productive cough for approx 2 1/2 weeks.

## 2017-02-12 NOTE — ED Provider Notes (Signed)
[facility] Emergency Department Provider Note   ____________________________________________   First MD Initiated Contact with Patient 02/12/17 1130     (approximate)  I have reviewed the triage vital signs and the nursing notes.   HISTORY  Chief Complaint Back Pain and Cough    HPI Patty Mata is a 26 y.o. female patient complaining of right flank pain and productive cough. Patient states he has a history of chronic flank pain and nephrolithiasis. 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 Patient is met aggravated her pain. He denies urinary complaints at this time. Patient rates the pain as a 9/10. Patient had a pain as "achy". No palliative measures for complaint. Patient also complaining of this productive cough for 2 and half weeks. Patient states sputum is yellowish in color. Patient complaining of mild chest wall pain secondary to coughing. No palliative measures for this complaint.  Past Medical History:  Diagnosis Date  . Anxiety   . Kidney stones     There are no active problems to display for this patient.   Past Surgical History:  Procedure Laterality Date  . STENT PLACEMENT NON-VASCULAR ([place] HX)     renal    Prior to Admission medications   Medication Sig Start Date End Date Taking? Authorizing Provider  ALPRAZolam Duanne Moron) 1 MG tablet Take 1 mg by mouth 2 (two) times daily as needed for anxiety.    [provider]  cloNIDine (CATAPRES) 0.1 MG tablet 0.1 mg  4 times a day for 2 days. 0.1 mg 2 times a day for 2 days 0.59m in the morning for 2 days. Patient not taking: Reported on 12/10/2016 08/23/14   HMargarita Mail PA-C  cyclobenzaprine (FLEXERIL) 10 MG tablet Take 1 tablet (10 mg total) by mouth 3 (three) times daily as needed for muscle spasms. Patient not taking: Reported on 12/10/2016 08/23/14   HMargarita Mail PA-C  cyclobenzaprine (FLEXERIL) 10 MG tablet Take 1 tablet (10 mg total) 3 (three)  times daily as needed by mouth. 02/12/17   SSable Feil PA-C  dicyclomine (BENTYL) 20 MG tablet Take 1 tablet (20 mg total) by mouth every 6 (six) hours. Patient not taking: Reported on 12/10/2016 08/23/14   HMargarita Mail PA-C  gabapentin (NEURONTIN) 300 MG capsule Take 300 mg by mouth 2 (two) times daily. 12/03/16   [provider]  HYDROcodone-acetaminophen (NORCO) 5-325 MG per tablet Take 1 tablet by mouth 2 (two) times daily as needed for severe pain. Patient not taking: Reported on 08/23/2014 03/19/14   OEverlene Balls MD  hydrOXYzine (ATARAX/VISTARIL) 25 MG tablet Take 1 tablet (25 mg total) by mouth every 6 (six) hours. Patient not taking: Reported on 12/10/2016 08/23/14   HMargarita Mail PA-C  ibuprofen (ADVIL,MOTRIN) 600 MG tablet Take 1 tablet (600 mg total) every 8 (eight) hours as needed by mouth. 02/12/17   SSable Feil PA-C  ibuprofen (ADVIL,MOTRIN) 800 MG tablet Take 1 tablet (800 mg total) by mouth 3 (three) times daily. Patient not taking: Reported on 12/10/2016 08/15/16   LFrederica Kuster PA-C  methocarbamol (ROBAXIN) 500 MG tablet Take 1 tablet (500 mg total) by mouth 2 (two) times daily. Patient not taking: Reported on 12/10/2016 05/02/15   Barrett, SLahoma Crocker PA-C  naproxen (NAPROSYN) 500 MG tablet Take 1 tablet (500 mg total) by mouth 2 (two) times daily with a meal. Patient not taking: Reported on 12/10/2016 08/23/14   HMargarita Mail PA-C  ondansetron (ZOFRAN) 4 MG tablet Take  1 tablet (4 mg total) by mouth every 8 (eight) hours as needed for nausea or vomiting. Patient not taking: Reported on 12/10/2016 08/23/14   Margarita Mail, PA-C  oxyCODONE (ROXICODONE) 5 MG immediate release tablet Take 1 tablet (5 mg total) by mouth 2 (two) times daily as needed for severe pain. Patient not taking: Reported on 08/23/2014 03/19/14   Everlene Balls, MD  traMADol (ULTRAM) 50 MG tablet Take 1 tablet (50 mg total) every 12 (twelve) hours as needed by mouth. 02/12/17   Sable Feil,  PA-C    Allergies Penicillins  Family History  Problem Relation Age of Onset  . Depression Mother   . Arthritis Mother   . Asthma Father   . Mental illness Father     Social History Social History   Tobacco Use  . Smoking status: Current Every Day Smoker    Packs/day: 0.25    Years: 5.00    Pack years: 1.25    Types: Cigarettes  . Smokeless tobacco: Never Used  Substance Use Topics  . Alcohol use: Yes    Comment: Socially; denies today  . 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 ENT: No sore throat. Cardiovascular: Denies chest pain. Respiratory: Denies shortness of breath. Gastrointestinal: No abdominal pain.  No nausea, no vomiting.  No diarrhea.  No constipation. Genitourinary: Negative for dysuria. Musculoskeletal: Negative for back pain. Skin: Negative for rash. Neurological: Negative for headaches, focal weakness or numbness. Psychiatric: Anxiety Allergic/Immunilogical: Penicillin ____________________________________________   PHYSICAL EXAM:  VITAL SIGNS: ED Triage Vitals  Enc Vitals Group     BP      Pulse      Resp      Temp      Temp src      SpO2      Weight      Height      Head Circumference      Peak Flow      Pain Score      Pain Loc      Pain Edu?      Excl. in [place]?     Constitutional: Alert and oriented. Well appearing and in no acute distress. Cardiovascular: Normal rate, regular rhythm. Grossly normal heart sounds.  Good peripheral circulation. Respiratory: Normal respiratory effort.  No retractions. Lungs CTAB. Gastrointestinal: Soft and nontender. No distention. No abdominal bruits. Right CVA tenderness. Genitourinary: Deferred Musculoskeletal: No obvious spinal deformity. Patient has full nuchal range of motion. Patient negative straight leg test. No lower extremity tenderness nor edema.  No joint effusions. Neurologic:   Normal speech and language. No gross focal neurologic deficits are appreciated. No gait instability. Skin:  Skin is warm, dry and intact. No rash noted. Psychiatric: Mood and affect are normal. Speech and behavior are normal.  ____________________________________________   LABS (all labs ordered are listed, but only abnormal results are displayed)  Labs Reviewed  URINALYSIS, COMPLETE (UACMP) WITH MICROSCOPIC - Abnormal; Notable for the following components:      Result Value   Color, Urine YELLOW (*)    APPearance HAZY (*)    Ketones, ur 5 (*)    Protein, ur 30 (*)    Squamous Epithelial / LPF 0-5 (*)    All other components within normal limits  POC URINE PREG, ED  POCT PREGNANCY, URINE   ____________________________________________  EKG   ____________________________________________  RADIOLOGY  No results  found.  ____________________________________________   PROCEDURES  Procedure(s) performed: None  Procedures  Critical Care performed: No  ____________________________________________   INITIAL IMPRESSION / ASSESSMENT AND PLAN / ED COURSE  As part of my medical decision making, I reviewed the following data within the [place]    Patient presented with right flank pain secondary to exertion. Patient's concern for kidney infection but her urinalysis was unremarkable. Patient given discharge care instructions and advised take medication as directed. Patient given a work note and advised to follow-up with the [facility] if condition persists.      ____________________________________________   FINAL CLINICAL IMPRESSION(S) / ED DIAGNOSES  Final diagnoses:  Right flank pain     ED Discharge Orders        Ordered    cyclobenzaprine (FLEXERIL) 10 MG tablet  3 times daily PRN     02/12/17 1310    traMADol (ULTRAM) 50 MG tablet  Every 12 hours PRN     02/12/17 1310    ibuprofen (ADVIL,MOTRIN) 600 MG tablet   Every 8 hours PRN     02/12/17 1310       Note:  This document was prepared using Dragon voice recognition software and may include unintentional dictation errors.    Allyiah, Gartner, PA-C 02/12/17 1313    Earleen Newport, MD 02/12/17 [phone]

## 2017-02-12 NOTE — ED Notes (Signed)
Pt verbalized understanding of discharge instructions. NAD at this time. 

## 2017-02-21 ENCOUNTER — Emergency Department
Admission: EM | Admit: 2017-02-21 | Discharge: 2017-02-21 | Disposition: A | Discharge status: Home / Self Care | Payer: Medicaid Other | Attending: Emergency Medicine | Admitting: Emergency Medicine

## 2017-02-21 ENCOUNTER — Other Ambulatory Visit: Payer: Self-pay

## 2017-02-21 ENCOUNTER — Encounter: Payer: Self-pay | Admitting: Emergency Medicine

## 2017-02-21 DIAGNOSIS — F17210 Nicotine dependence, cigarettes, uncomplicated: Secondary | ICD-10-CM | POA: Insufficient documentation

## 2017-02-21 DIAGNOSIS — M62830 Muscle spasm of back: Secondary | ICD-10-CM | POA: Diagnosis not present

## 2017-02-21 DIAGNOSIS — M62838 Other muscle spasm: Secondary | ICD-10-CM

## 2017-02-21 DIAGNOSIS — M545 Low back pain: Secondary | ICD-10-CM | POA: Diagnosis present

## 2017-02-21 DIAGNOSIS — Z79899 Other long term (current) drug therapy: Secondary | ICD-10-CM | POA: Diagnosis not present

## 2017-02-21 LAB — URINALYSIS, COMPLETE (UACMP) WITH MICROSCOPIC
BACTERIA UA: NONE SEEN
BILIRUBIN URINE: NEGATIVE
Glucose, UA: NEGATIVE mg/dL
HGB URINE DIPSTICK: NEGATIVE
Ketones, ur: NEGATIVE mg/dL
LEUKOCYTES UA: NEGATIVE
NITRITE: NEGATIVE
PROTEIN: NEGATIVE mg/dL
RBC / HPF: NONE SEEN RBC/hpf (ref 0–5)
Specific Gravity, Urine: 1.01 (ref 1.005–1.030)
pH: 6 (ref 5.0–8.0)

## 2017-02-21 LAB — CBC
HCT: 41 % (ref 35.0–47.0)
Hemoglobin: 13.7 g/dL (ref 12.0–16.0)
MCH: 31.8 pg (ref 26.0–34.0)
MCHC: 33.4 g/dL (ref 32.0–36.0)
MCV: 95.2 fL (ref 80.0–100.0)
PLATELETS: 161 10*3/uL (ref 150–440)
RBC: 4.31 MIL/uL (ref 3.80–5.20)
RDW: 13.2 % (ref 11.5–14.5)
WBC: 5.3 10*3/uL (ref 3.6–11.0)

## 2017-02-21 LAB — BASIC METABOLIC PANEL
ANION GAP: 9 (ref 5–15)
BUN: 9 mg/dL (ref 6–20)
CALCIUM: 9.6 mg/dL (ref 8.9–10.3)
CO2: 25 mmol/L (ref 22–32)
CREATININE: 0.63 mg/dL (ref 0.44–1.00)
Chloride: 103 mmol/L (ref 101–111)
GLUCOSE: 80 mg/dL (ref 65–99)
Potassium: 4.1 mmol/L (ref 3.5–5.1)
Sodium: 137 mmol/L (ref 135–145)

## 2017-02-21 LAB — POCT PREGNANCY, URINE: Preg Test, Ur: NEGATIVE

## 2017-02-21 MED ORDER — KETOROLAC TROMETHAMINE 30 MG/ML IJ SOLN
INTRAMUSCULAR | Status: AC
  Administered 2017-02-21: 30 mg via INTRAMUSCULAR
  Filled 2017-02-21: qty 1

## 2017-02-21 MED ORDER — KETOROLAC TROMETHAMINE 30 MG/ML IJ SOLN
30.0000 mg | Freq: Once | INTRAMUSCULAR | Status: AC
  Administered 2017-02-21: 30 mg via INTRAMUSCULAR

## 2017-02-21 NOTE — ED Triage Notes (Signed)
Pt reports seen here 02/12/17 for low back pain. Pt reports was told has pulled muscle. Pt reports continued pain low back pain and right flank pain. Pt reports urinary frequency. Reports nausea. Denies vomiting and diarrhea. 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 No apparent distress noted.

## 2017-02-21 NOTE — ED Notes (Signed)
AAOx3.  Skin warm and dry.  NAD 

## 2017-02-21 NOTE — ED Provider Notes (Signed)
[facility] Emergency Department Provider Note   ____________________________________________    I have reviewed the triage vital signs and the nursing notes.   HISTORY  Chief Complaint Back pain    HPI Patty Mata is a 26 y.o. female with a history of kidney stones presents with complaints of right lumbar paraspinal moderate aching back pain.  Patient seen recently and prescribed medication for musculoskeletal back pain.  She reports she continues to have pain and it is interfering with her job, she reports her employer told her she needed a work note.  She denies neuro deficits.  No urinary retention.  No fevers or chills.  No flank pain   Past Medical History:  Diagnosis Date  . Anxiety   . Kidney stones     There are no active problems to display for this patient.   Past Surgical History:  Procedure Laterality Date  . STENT PLACEMENT NON-VASCULAR ([facility] HX)     renal    Prior to Admission medications   Medication Sig Start Date End Date Taking? Authorizing Provider  ALPRAZolam Prudy Feeler(XANAX) 1 MG tablet Take 1 mg by mouth 2 (two) times daily as needed for anxiety.    [provider]  cloNIDine (CATAPRES) 0.1 MG tablet 0.1 mg  4 times a day for 2 days. 0.1 mg 2 times a day for 2 days 0.1mg  in the morning for 2 days. Patient not taking: Reported on 12/10/2016 08/23/14   Arthor CaptainHarris, Abigail, PA-C  cyclobenzaprine (FLEXERIL) 10 MG tablet Take 1 tablet (10 mg total) by mouth 3 (three) times daily as needed for muscle spasms. Patient not taking: Reported on 12/10/2016 08/23/14   Arthor CaptainHarris, Abigail, PA-C  cyclobenzaprine (FLEXERIL) 10 MG tablet Take 1 tablet (10 mg total) 3 (three) times daily as needed by mouth. 02/12/17   Joni ReiningSmith, Ronald K, PA-C  dicyclomine (BENTYL) 20 MG tablet Take 1 tablet (20 mg total) by mouth every 6 (six) hours. Patient not taking: Reported on 12/10/2016 08/23/14   Arthor CaptainHarris, Abigail, PA-C  gabapentin (NEURONTIN) 300 MG capsule Take  300 mg by mouth 2 (two) times daily. 12/03/16   [provider]  HYDROcodone-acetaminophen (NORCO) 5-325 MG per tablet Take 1 tablet by mouth 2 (two) times daily as needed for severe pain. Patient not taking: Reported on 08/23/2014 03/19/14   Tomasita Crumbleni, Adeleke, MD  hydrOXYzine (ATARAX/VISTARIL) 25 MG tablet Take 1 tablet (25 mg total) by mouth every 6 (six) hours. Patient not taking: Reported on 12/10/2016 08/23/14   Arthor CaptainHarris, Abigail, PA-C  ibuprofen (ADVIL,MOTRIN) 600 MG tablet Take 1 tablet (600 mg total) every 8 (eight) hours as needed by mouth. 02/12/17   Joni ReiningSmith, Ronald K, PA-C  ibuprofen (ADVIL,MOTRIN) 800 MG tablet Take 1 tablet (800 mg total) by mouth 3 (three) times daily. Patient not taking: Reported on 12/10/2016 08/15/16   Emi HolesLaw, Alexandra M, PA-C  methocarbamol (ROBAXIN) 500 MG tablet Take 1 tablet (500 mg total) by mouth 2 (two) times daily. Patient not taking: Reported on 12/10/2016 05/02/15   Barrett, Rolm GalaStevi, PA-C  naproxen (NAPROSYN) 500 MG tablet Take 1 tablet (500 mg total) by mouth 2 (two) times daily with a meal. Patient not taking: Reported on 12/10/2016 08/23/14   Arthor CaptainHarris, Abigail, PA-C  ondansetron (ZOFRAN) 4 MG tablet Take 1 tablet (4 mg total) by mouth every 8 (eight) hours as needed for nausea or vomiting. Patient not taking: Reported on 12/10/2016 08/23/14   Arthor CaptainHarris, Abigail, PA-C  oxyCODONE (ROXICODONE) 5 MG immediate release tablet Take  1 tablet (5 mg total) by mouth 2 (two) times daily as needed for severe pain. Patient not taking: Reported on 08/23/2014 03/19/14   Tomasita Crumbleni, Adeleke, MD  traMADol (ULTRAM) 50 MG tablet Take 1 tablet (50 mg total) every 12 (twelve) hours as needed by mouth. 02/12/17   Joni ReiningSmith, Ronald K, PA-C     Allergies Penicillins  Family History  Problem Relation Age of Onset  . Depression Mother   . Arthritis Mother   . Asthma Father   . Mental illness Father     Social History Social History   Tobacco Use  . Smoking status: Current Every Day Smoker     Packs/day: 0.25    Years: 5.00    Pack years: 1.25    Types: Cigarettes  . Smokeless tobacco: Never Used  Substance Use Topics  . Alcohol use: Yes    Comment: Socially; denies today  . 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  No nausea, no vomiting.   Genitourinary: Negative for dysuria. Musculoskeletal: As above Skin: Negative for rash. Neurological: Negative for headaches     ____________________________________________   PHYSICAL EXAM:  VITAL SIGNS: ED Triage Vitals  Enc Vitals Group     BP 02/21/17 1402 135/79     Pulse Rate 02/21/17 1402 (!) 131     Resp 02/21/17 1402 18     Temp 02/21/17 1402 98.6 F (37 C)     Temp Source 02/21/17 1402 Oral     SpO2 02/21/17 1402 100 %     Weight 02/21/17 1403 59 kg (130 lb)     Height 02/21/17 1403 1.549 m (5\' 1" )     Head Circumference --      Peak Flow --      Pain Score 02/21/17 1402 7     Pain Loc --      Pain Edu? --      Excl. in [place]? --     Constitutional: Alert and oriented. No acute distress. Pleasant and interactive Eyes: Conjunctivae are normal.  Head: Atraumatic.  Mouth/Throat: Mucous membranes are moist.   Cardiovascular: Normal rate, regular rhythm.  Respiratory: Normal respiratory effort.  No retractions. Genitourinary: deferred Musculoskeletal: Patient with point tenderness right lumbar paraspinal muscle spasm, no vertebral tenderness palpation, no CVA tenderness Neurologic:  Normal speech and language. No gross focal neurologic deficits are appreciated.   Skin:  Skin is warm, dry and intact. No rash noted.   ____________________________________________   LABS (all labs ordered are listed, but only abnormal results are displayed)  Labs Reviewed  URINALYSIS, COMPLETE (UACMP) WITH MICROSCOPIC - Abnormal; Notable for the following components:      Result  Value   Color, Urine YELLOW (*)    APPearance CLEAR (*)    Squamous Epithelial / LPF 0-5 (*)    All other components within normal limits  CBC  BASIC METABOLIC PANEL  POCT PREGNANCY, URINE   ____________________________________________  EKG   ____________________________________________  RADIOLOGY  None ____________________________________________   PROCEDURES  Procedure(s) performed:No  Procedures   Critical Care performed: No ____________________________________________   INITIAL IMPRESSION / ASSESSMENT AND PLAN / ED COURSE  Pertinent labs & imaging results that were available during my care of the patient were reviewed by me and considered in my medical decision making (see chart for details).  Exam is most consistent with muscle  spasm, treated with IM Toradol with significant improvement in pain.  Recommend continued NSAIDs will provide work note for 2 days.   ____________________________________________   FINAL CLINICAL IMPRESSION(S) / ED DIAGNOSES  Final diagnoses:  Muscle spasm      NEW MEDICATIONS STARTED DURING THIS VISIT:    Note:  This document was prepared using Dragon voice recognition software and may include unintentional dictation errors.    Jene Every, MD 02/21/17 [phone]

## 2017-03-02 ENCOUNTER — Emergency Department
Admission: EM | Admit: 2017-03-02 | Discharge: 2017-03-02 | Discharge status: Against Medical Advice | Payer: Medicaid Other | Attending: Emergency Medicine | Admitting: Emergency Medicine

## 2017-03-02 ENCOUNTER — Encounter: Payer: Self-pay | Admitting: Emergency Medicine

## 2017-03-02 DIAGNOSIS — Z79899 Other long term (current) drug therapy: Secondary | ICD-10-CM | POA: Diagnosis not present

## 2017-03-02 DIAGNOSIS — M545 Low back pain, unspecified: Secondary | ICD-10-CM

## 2017-03-02 DIAGNOSIS — F17210 Nicotine dependence, cigarettes, uncomplicated: Secondary | ICD-10-CM | POA: Diagnosis not present

## 2017-03-02 MED ORDER — KETOROLAC TROMETHAMINE 60 MG/2ML IM SOLN: 60.0000 mg | Freq: Once | INTRAMUSCULAR | Status: DC

## 2017-03-02 NOTE — ED Provider Notes (Signed)
[facility] Emergency Department Provider Note  Time seen: 12:23 PM  I have reviewed the triage vital signs and the nursing notes.   HISTORY  Chief Complaint Back Pain    HPI Patty Mata is a 26 y.o. female with a past medical history of anxiety, kidney stones presents to the emergency department for 2-3 weeks of lower back pain.  According to the patient for at least 2 weeks she has been experiencing lower back pain worse with movement especially twisting or bending.  Patient denies fever, abdominal pain, chest pain or trouble breathing.  Denies incontinence or numbness.  Patient was seen in the emergency department for the same 02/21/17 prescribed ibuprofen.  Continues to have discomfort.  Patient states she does do heavy lifting at work which she believes is contributing to her back pain.    Past Medical History:  Diagnosis Date  . Anxiety   . Kidney stones     There are no active problems to display for this patient.   Past Surgical History:  Procedure Laterality Date  . STENT PLACEMENT NON-VASCULAR ([facility] HX)     renal    Prior to Admission medications   Medication Sig Start Date End Date Taking? Authorizing Provider  ALPRAZolam Prudy Feeler(XANAX) 1 MG tablet Take 1 mg by mouth 2 (two) times daily as needed for anxiety.    [provider]  cloNIDine (CATAPRES) 0.1 MG tablet 0.1 mg  4 times a day for 2 days. 0.1 mg 2 times a day for 2 days 0.1mg  in the morning for 2 days. Patient not taking: Reported on 12/10/2016 08/23/14   Arthor CaptainHarris, Abigail, PA-C  cyclobenzaprine (FLEXERIL) 10 MG tablet Take 1 tablet (10 mg total) by mouth 3 (three) times daily as needed for muscle spasms. Patient not taking: Reported on 12/10/2016 08/23/14   Arthor CaptainHarris, Abigail, PA-C  cyclobenzaprine (FLEXERIL) 10 MG tablet Take 1 tablet (10 mg total) 3 (three) times daily as needed by mouth. 02/12/17   Joni ReiningSmith, Ronald K, PA-C  dicyclomine (BENTYL) 20 MG tablet Take 1 tablet (20 mg total)  by mouth every 6 (six) hours. Patient not taking: Reported on 12/10/2016 08/23/14   Arthor CaptainHarris, Abigail, PA-C  gabapentin (NEURONTIN) 300 MG capsule Take 300 mg by mouth 2 (two) times daily. 12/03/16   [provider]  HYDROcodone-acetaminophen (NORCO) 5-325 MG per tablet Take 1 tablet by mouth 2 (two) times daily as needed for severe pain. Patient not taking: Reported on 08/23/2014 03/19/14   Tomasita Crumbleni, Adeleke, MD  hydrOXYzine (ATARAX/VISTARIL) 25 MG tablet Take 1 tablet (25 mg total) by mouth every 6 (six) hours. Patient not taking: Reported on 12/10/2016 08/23/14   Arthor CaptainHarris, Abigail, PA-C  ibuprofen (ADVIL,MOTRIN) 600 MG tablet Take 1 tablet (600 mg total) every 8 (eight) hours as needed by mouth. 02/12/17   Joni ReiningSmith, Ronald K, PA-C  ibuprofen (ADVIL,MOTRIN) 800 MG tablet Take 1 tablet (800 mg total) by mouth 3 (three) times daily. Patient not taking: Reported on 12/10/2016 08/15/16   Emi HolesLaw, Alexandra M, PA-C  methocarbamol (ROBAXIN) 500 MG tablet Take 1 tablet (500 mg total) by mouth 2 (two) times daily. Patient not taking: Reported on 12/10/2016 05/02/15   Barrett, Rolm GalaStevi, PA-C  naproxen (NAPROSYN) 500 MG tablet Take 1 tablet (500 mg total) by mouth 2 (two) times daily with a meal. Patient not taking: Reported on 12/10/2016 08/23/14   Arthor CaptainHarris, Abigail, PA-C  ondansetron (ZOFRAN) 4 MG tablet Take 1 tablet (4 mg total) by mouth every 8 (eight) hours as  needed for nausea or vomiting. Patient not taking: Reported on 12/10/2016 08/23/14   Arthor Captain, PA-C  oxyCODONE (ROXICODONE) 5 MG immediate release tablet Take 1 tablet (5 mg total) by mouth 2 (two) times daily as needed for severe pain. Patient not taking: Reported on 08/23/2014 03/19/14   Tomasita Crumble, MD  traMADol (ULTRAM) 50 MG tablet Take 1 tablet (50 mg total) every 12 (twelve) hours as needed by mouth. 02/12/17   Joni Reining, PA-C    Allergies  Allergen Reactions  . Penicillins Rash    Family History  Problem Relation Age of Onset  . Depression  Mother   . Arthritis Mother   . Asthma Father   . Mental illness Father     Social History Social History   Tobacco Use  . Smoking status: Current Every Day Smoker    Packs/day: 0.25    Years: 5.00    Pack years: 1.25    Types: Cigarettes  . Smokeless tobacco: Never Used  Substance Use Topics  . Alcohol use: Yes    Comment: Socially; denies today  . 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 Respiratory: Negative for shortness of breath. Gastrointestinal: Negative for abdominal pain Genitourinary: Negative for dysuria.  Currently on her menstrual period Musculoskeletal: Moderate lower back pain worse with movement Neurological: Negative for headache.  Denies numbness of her legs. All other ROS negative  ____________________________________________   PHYSICAL EXAM:  VITAL SIGNS: ED Triage Vitals  Enc Vitals Group     BP 03/02/17 1017 (!) 153/99     Pulse Rate 03/02/17 1017 (!) 136     Resp 03/02/17 1017 20     Temp 03/02/17 1017 98.1 F (36.7 C)     Temp Source 03/02/17 1017 Oral     SpO2 03/02/17 1017 100 %     Weight 03/02/17 1020 130 lb (59 kg)     Height 03/02/17 1020 5\' 1"  (1.549 m)     Head Circumference --      Peak Flow --      Pain Score 03/02/17 1020 9     Pain Loc --      Pain Edu? --      Excl. in [place]? --    Constitutional: Alert and oriented.  Mild distress when moving around.  States it hurts worse trying to get herself onto bed. Eyes: Normal exam ENT   Head: Normocephalic and atraumatic   Mouth/Throat: Mucous membranes are moist. Cardiovascular: Normal rate, regular rhythm.  Respiratory: Normal respiratory effort without tachypnea nor retractions. Breath sounds are clear  Gastrointestinal: Soft and nontender. No distention.  Musculoskeletal: Nontender with normal range of motion in all extremities.  Moderate  paraspinal lumbar tenderness to palpation.  The remainder of the spine is nontender. Neurologic:  Normal speech and language. No gross focal neurologic deficits Skin:  Skin is warm, dry and intact.  Psychiatric: Mood and affect are normal.   ____________________________________________   INITIAL IMPRESSION / ASSESSMENT AND PLAN / ED COURSE  Pertinent labs & imaging results that were available during my care of the patient were reviewed by me and considered in my medical decision making (see chart for details).  Patient presents to the emergency department with lower back pain over the past 2-3 weeks.  Patient attributes it to lifting at work.  Denies any fever, numbness, incontinence or other concerning findings.  Differential would include musculoskeletal pain, lumbar strain, bulging disc.  Given the patient's persistent pain and lumbar tenderness to palpation I highly suspect musculoskeletal pain.  Patient was seen 02/21/17 for the same with normal labs, I do not feel repeating lab work would be of benefit to the patient.  Patient denies any incontinence numbness, ambulates without difficulty.  We will dose Toradol in the emergency department for pain control.  We will likely discharge with Flexeril and ibuprofen.  Patient agreeable to this plan of care.  I discussed with the patient the need to follow-up with a primary care doctor for ongoing management.  Patient is agreeable.  Per nurse the patient had refused Toradol.  Patient is no longer in the room, appears to have eloped from the emergency department.  Attempted to locate her without success.  ____________________________________________   FINAL CLINICAL IMPRESSION(S) / ED DIAGNOSES  low back pain    Minna AntisPaduchowski, Mersedes Alber, MD 03/02/17 1411

## 2017-03-02 NOTE — ED Notes (Addendum)
Patient departed after medical screening was performed but prior to starting or completing recommended treatment plan.  Checked room three times since 12:56, patient not in room, no personal property of patient in room.

## 2017-03-02 NOTE — ED Triage Notes (Signed)
Pt presents c/o back pain x2-3weeks. Reports being seen but pain has not resolved. Does not have PCP per pt report. Reports pain to right lower back with spasms with moving.

## 2017-03-02 NOTE — ED Notes (Signed)
Patient stated she was not happy with Ibuprofen and said 'I have that crap at home, it doesn't do anything!" Patient also informed this writer of her dissatisfaction with Toradol and wanted something stronger.  Patient did also state that [place] in [place].

## 2017-03-02 NOTE — ED Notes (Signed)
Patient not in room

## 2017-03-14 ENCOUNTER — Inpatient Hospital Stay (HOSPITAL_COMMUNITY)
Admission: AD | Admit: 2017-03-14 | Discharge: 2017-03-14 | Disposition: A | Discharge status: Home / Self Care | Payer: Medicaid Other | Source: Ambulatory Visit | Attending: Family Medicine | Admitting: Family Medicine

## 2017-03-14 ENCOUNTER — Encounter (HOSPITAL_COMMUNITY): Payer: Self-pay | Admitting: *Deleted

## 2017-03-14 DIAGNOSIS — F17210 Nicotine dependence, cigarettes, uncomplicated: Secondary | ICD-10-CM | POA: Insufficient documentation

## 2017-03-14 DIAGNOSIS — R112 Nausea with vomiting, unspecified: Secondary | ICD-10-CM | POA: Insufficient documentation

## 2017-03-14 DIAGNOSIS — Z79891 Long term (current) use of opiate analgesic: Secondary | ICD-10-CM | POA: Insufficient documentation

## 2017-03-14 DIAGNOSIS — Z79899 Other long term (current) drug therapy: Secondary | ICD-10-CM | POA: Insufficient documentation

## 2017-03-14 DIAGNOSIS — Z880 Allergy status to penicillin: Secondary | ICD-10-CM | POA: Insufficient documentation

## 2017-03-14 LAB — URINALYSIS, ROUTINE W REFLEX MICROSCOPIC
BILIRUBIN URINE: NEGATIVE
Glucose, UA: NEGATIVE mg/dL
Hgb urine dipstick: NEGATIVE
KETONES UR: NEGATIVE mg/dL
Leukocytes, UA: NEGATIVE
NITRITE: NEGATIVE
Protein, ur: NEGATIVE mg/dL
Specific Gravity, Urine: 1.014 (ref 1.005–1.030)
pH: 6 (ref 5.0–8.0)

## 2017-03-14 LAB — HCG, QUANTITATIVE, PREGNANCY: hCG, Beta Chain, Quant, S: <1 m[IU]/mL (ref <5)

## 2017-03-14 LAB — CBC
HCT: 36.2 % (ref 36.0–46.0)
Hemoglobin: 11.6 g/dL — ABNORMAL LOW (ref 12.0–15.0)
MCH: 31.1 pg (ref 26.0–34.0)
MCHC: 32 g/dL (ref 30.0–36.0)
MCV: 97.1 fL (ref 78.0–100.0)
PLATELETS: 147 10*3/uL — AB (ref 150–400)
RBC: 3.73 MIL/uL — ABNORMAL LOW (ref 3.87–5.11)
RDW: 12.8 % (ref 11.5–15.5)
WBC: 5.2 10*3/uL (ref 4.0–10.5)

## 2017-03-14 LAB — WET PREP, GENITAL
Clue Cells Wet Prep HPF POC: NONE SEEN
SPERM: NONE SEEN
Trich, Wet Prep: NONE SEEN
Yeast Wet Prep HPF POC: NONE SEEN

## 2017-03-14 LAB — POCT PREGNANCY, URINE: PREG TEST UR: NEGATIVE

## 2017-03-14 MED ORDER — ONDANSETRON 8 MG PO TBDP: 8.0000 mg | ORAL_TABLET | Freq: Two times a day (BID) | ORAL | 0 refills | Status: DC

## 2017-03-14 NOTE — MAU Note (Signed)
Pt reports she had 2 positive home preg tests yesterday, reports nausea and vomiting off/on. Denies pain of bleeding.

## 2017-03-14 NOTE — Discharge Instructions (Signed)
Your pregnancy test is negative.  Use condoms every time you have sex to avoid pregnancy. Get your medication for nausea -  Take an over the counter stool softener so you do not become constipated. Get established with a doctor so you can have a physical and a Pap smear done. Go to Urgent Care if you develop fever, repetitive vomiting. Or severe lower abdominal pain.

## 2017-03-14 NOTE — MAU Provider Note (Signed)
History     CSN: [id number]  Arrival date and time: 03/14/17 1225   First Provider Initiated Contact with Patient 03/14/17 1333      Chief Complaint  Patient presents with  . Possible Pregnancy  . Emesis  . Nausea   HPI Patty Mata 26 y.o.  Client has been having periodic nausea and vomiting in the past couple of weeks.  Did 2 pregnancy tests and they were mildly positive - brought them with her today.  Did not want to be pregnant but thought that would explain her symptoms today.  Quant drawn as the urine pregnancy test was negative today.   OB History    Gravida Para Term Preterm AB Living   1 1       1    SAB TAB Ectopic Multiple Live Births                  Past Medical History:  Diagnosis Date  . Anxiety   . Kidney stones     Past Surgical History:  Procedure Laterality Date  . STENT PLACEMENT NON-VASCULAR ([facility] HX)     renal    Family History  Problem Relation Age of Onset  . Depression Mother   . Arthritis Mother   . Asthma Father   . Mental illness Father     Social History   Tobacco Use  . Smoking status: Current Every Day Smoker    Packs/day: 0.25    Years: 5.00    Pack years: 1.25    Types: Cigarettes  . Smokeless tobacco: Never Used  Substance Use Topics  . Alcohol use: Yes    Comment: Socially; denies today  . 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 Penicillins Rash    Medications Prior to Admission  Medication Sig Dispense Refill Last Dose  . ALPRAZolam (XANAX) 1 MG tablet Take 1 mg by mouth 2 (two) times daily as needed for anxiety.   12/10/2016 at Unknown time  . cloNIDine (CATAPRES) 0.1 MG tablet 0.1 mg  4 times a day for 2 days. 0.1 mg 2 times a day for 2 days 0.1mg  in the morning for 2 days. (Patient not taking: Reported on 12/10/2016) 14 tablet 0 Completed Course at Unknown time  . cyclobenzaprine (FLEXERIL) 10 MG tablet Take 1 tablet (10 mg total)  by mouth 3 (three) times daily as needed for muscle spasms. (Patient not taking: Reported on 12/10/2016) 30 tablet 0 Completed Course at Unknown time  . cyclobenzaprine (FLEXERIL) 10 MG tablet Take 1 tablet (10 mg total) 3 (three) times daily as needed by mouth. 15 tablet 0   . dicyclomine (BENTYL) 20 MG tablet Take 1 tablet (20 mg total) by mouth every 6 (six) hours. (Patient not taking: Reported on 12/10/2016) 20 tablet 0 Completed Course at Unknown time  . gabapentin (NEURONTIN) 300 MG capsule Take 300 mg by mouth 2 (two) times daily.  0 12/09/2016 at Unknown time  . HYDROcodone-acetaminophen (NORCO) 5-325 MG per tablet Take 1 tablet by mouth 2 (two) times daily as needed for severe pain. (Patient not taking: Reported on 08/23/2014) 8 tablet 0 Completed Course at Unknown time  . hydrOXYzine (ATARAX/VISTARIL) 25 MG tablet Take 1 tablet (25 mg total) by mouth every 6 (six) hours. (Patient not taking: Reported on 12/10/2016) 12 tablet 0 Completed Course at Unknown time  . ibuprofen (ADVIL,MOTRIN) 600 MG tablet Take 1 tablet (  600 mg total) every 8 (eight) hours as needed by mouth. 15 tablet 0   . ibuprofen (ADVIL,MOTRIN) 800 MG tablet Take 1 tablet (800 mg total) by mouth 3 (three) times daily. (Patient not taking: Reported on 12/10/2016) 21 tablet 0 Completed Course at Unknown time  . methocarbamol (ROBAXIN) 500 MG tablet Take 1 tablet (500 mg total) by mouth 2 (two) times daily. (Patient not taking: Reported on 12/10/2016) 10 tablet 0 Completed Course at Unknown time  . naproxen (NAPROSYN) 500 MG tablet Take 1 tablet (500 mg total) by mouth 2 (two) times daily with a meal. (Patient not taking: Reported on 12/10/2016) 30 tablet 0 Completed Course at Unknown time  . ondansetron (ZOFRAN) 4 MG tablet Take 1 tablet (4 mg total) by mouth every 8 (eight) hours as needed for nausea or vomiting. (Patient not taking: Reported on 12/10/2016) 15 tablet 0 Completed Course at Unknown time  . oxyCODONE (ROXICODONE) 5 MG  immediate release tablet Take 1 tablet (5 mg total) by mouth 2 (two) times daily as needed for severe pain. (Patient not taking: Reported on 08/23/2014) 8 tablet 0 Not Taking at Unknown time  . traMADol (ULTRAM) 50 MG tablet Take 1 tablet (50 mg total) every 12 (twelve) hours as needed by mouth. 12 tablet 0     Review of Systems  Constitutional: Negative for fever.  Gastrointestinal: Positive for nausea and vomiting. Negative for abdominal pain.  Genitourinary: Negative for dysuria, vaginal bleeding and vaginal discharge.       Breast pain   Physical Exam   Blood pressure 114/66, pulse 87, temperature 97.9 F (36.6 C), temperature source Oral, resp. rate 16, height 5\' 1"  (1.549 m), weight 122 lb (55.3 kg), last menstrual period 03/02/2017, SpO2 99 %.  Physical Exam  Nursing note and vitals reviewed. Constitutional: She is oriented to person, place, and time. She appears well-developed and well-nourished.  HENT:  Head: Normocephalic.  Eyes: EOM are normal.  Neck: Neck supple.  GI: Soft. There is no tenderness. There is no rebound and no guarding.  Genitourinary:  Genitourinary Comments: Speculum exam: Vagina - Minimal discharge, no odor Cervix - No contact bleeding Bimanual exam: deferred GC/Chlam, wet prep done Chaperone present for exam.   Musculoskeletal: Normal range of motion.  Neurological: She is alert and oriented to person, place, and time.  Skin: Skin is warm and dry.  Psychiatric: She has a normal mood and affect.    MAU Course  Procedures Results for orders placed or performed during the hospital encounter of 03/14/17 (from the past 24 hour(s))  Urinalysis, Routine w reflex microscopic     Status: None   Collection Time: 03/14/17 12:42 PM  Result Value Ref Range   Color, Urine YELLOW YELLOW   APPearance CLEAR CLEAR   Specific Gravity, Urine 1.014 1.005 - 1.030   pH 6.0 5.0 - 8.0   Glucose, UA NEGATIVE NEGATIVE mg/dL   Hgb urine dipstick NEGATIVE NEGATIVE    Bilirubin Urine NEGATIVE NEGATIVE   Ketones, ur NEGATIVE NEGATIVE mg/dL   Protein, ur NEGATIVE NEGATIVE mg/dL   Nitrite NEGATIVE NEGATIVE   Leukocytes, UA NEGATIVE NEGATIVE  Pregnancy, urine POC     Status: None   Collection Time: 03/14/17 12:57 PM  Result Value Ref Range   Preg Test, Ur NEGATIVE NEGATIVE  CBC     Status: Abnormal   Collection Time: 03/14/17  1:42 PM  Result Value Ref Range   WBC 5.2 4.0 - 10.5 K/uL   RBC 3.73 (L)  3.87 - 5.11 MIL/uL   Hemoglobin 11.6 (L) 12.0 - 15.0 g/dL   HCT 40.936.2 81.136.0 - 91.446.0 %   MCV 97.1 78.0 - 100.0 fL   MCH 31.1 26.0 - 34.0 pg   MCHC 32.0 30.0 - 36.0 g/dL   RDW 78.212.8 95.611.5 - 21.315.5 %   Platelets 147 (L) 150 - 400 K/uL  hCG, quantitative, pregnancy     Status: None   Collection Time: 03/14/17  1:42 PM  Result Value Ref Range   hCG, Beta Chain, Quant, S <1 <5 mIU/mL  Wet prep, genital     Status: Abnormal   Collection Time: 03/14/17  3:10 PM  Result Value Ref Range   Yeast Wet Prep HPF POC NONE SEEN NONE SEEN   Trich, Wet Prep NONE SEEN NONE SEEN   Clue Cells Wet Prep HPF POC NONE SEEN NONE SEEN   WBC, Wet Prep HPF POC FEW (A) NONE SEEN   Sperm NONE SEEN     MDM Client was upset the quant was not positive although she did not want to be pregnant.  Wanted STD testing.  Wanted a note for work and gave her one.  Assessment and Plan  Nausea and vomiting No pregnancy today.  Plan Your pregnancy test is negative.  Use condoms every time you have sex to avoid pregnancy. Get your medication for nausea -  Take an over the counter stool softener so you do not become constipated. Get established with a doctor so you can have a physical and a Pap smear done.  [facility] as [facility] would [facility] her Medicaid. Go to Urgent Care if you develop fever, repetitive vomiting. Or severe lower abdominal pain.   Patty Mata 03/14/2017, 1:33 PM

## 2017-03-17 LAB — GC/CHLAMYDIA PROBE AMP (~~LOC~~) NOT AT ARMC
CHLAMYDIA, DNA PROBE: NEGATIVE
NEISSERIA GONORRHEA: NEGATIVE

## 2017-03-20 ENCOUNTER — Inpatient Hospital Stay (HOSPITAL_COMMUNITY)
Admission: AD | Admit: 2017-03-20 | Discharge: 2017-03-20 | Disposition: A | Discharge status: Home / Self Care | Payer: Medicaid Other | Source: Ambulatory Visit | Attending: Obstetrics and Gynecology | Admitting: Obstetrics and Gynecology

## 2017-03-20 ENCOUNTER — Encounter (HOSPITAL_COMMUNITY): Payer: Self-pay | Admitting: *Deleted

## 2017-03-20 ENCOUNTER — Other Ambulatory Visit: Payer: Self-pay

## 2017-03-20 DIAGNOSIS — Z880 Allergy status to penicillin: Secondary | ICD-10-CM | POA: Diagnosis not present

## 2017-03-20 DIAGNOSIS — F17210 Nicotine dependence, cigarettes, uncomplicated: Secondary | ICD-10-CM | POA: Insufficient documentation

## 2017-03-20 DIAGNOSIS — Z3202 Encounter for pregnancy test, result negative: Secondary | ICD-10-CM | POA: Insufficient documentation

## 2017-03-20 DIAGNOSIS — R110 Nausea: Secondary | ICD-10-CM | POA: Insufficient documentation

## 2017-03-20 LAB — URINALYSIS, ROUTINE W REFLEX MICROSCOPIC
BACTERIA UA: NONE SEEN
BILIRUBIN URINE: NEGATIVE
GLUCOSE, UA: NEGATIVE mg/dL
HGB URINE DIPSTICK: NEGATIVE
KETONES UR: 20 mg/dL — AB
Leukocytes, UA: NEGATIVE
NITRITE: NEGATIVE
PROTEIN: 30 mg/dL — AB
Specific Gravity, Urine: 1.02 (ref 1.005–1.030)
pH: 5 (ref 5.0–8.0)

## 2017-03-20 LAB — CBC
HEMATOCRIT: 35.9 % — AB (ref 36.0–46.0)
Hemoglobin: 12.1 g/dL (ref 12.0–15.0)
MCH: 31.5 pg (ref 26.0–34.0)
MCHC: 33.7 g/dL (ref 30.0–36.0)
MCV: 93.5 fL (ref 78.0–100.0)
PLATELETS: 156 10*3/uL (ref 150–400)
RBC: 3.84 MIL/uL — ABNORMAL LOW (ref 3.87–5.11)
RDW: 12.9 % (ref 11.5–15.5)
WBC: 6.1 10*3/uL (ref 4.0–10.5)

## 2017-03-20 LAB — HCG, SERUM, QUALITATIVE: PREG SERUM: NEGATIVE

## 2017-03-20 NOTE — MAU Note (Signed)
Not in lobby, "went to vending"

## 2017-03-20 NOTE — MAU Note (Addendum)
Patient inquiring about ultrasound for pregnancy.  POC UPT in MAU negative today.

## 2017-03-20 NOTE — MAU Note (Signed)
Was here about a wk ago, 3 +urine tests.  Was here - neg urine and blood test.  Still hasn't gotten period. Nausea all day,l everyday. No appetite, loss of  feeling worse, boobs are tender.  Something is just not right, doesn't understand.

## 2017-03-20 NOTE — MAU Provider Note (Signed)
Chief Complaint: Nausea and Possible Pregnancy   First Provider Initiated Contact with Patient 03/20/17 1704     SUBJECTIVE HPI: Patty LewandowskyKeely M Branham is a 26 y.o. G1P1001  who presents to Maternity Admissions reporting nausea & possible pregnancy. States she has had 3 positive HPTs this week. Has been seen twice before in the last few months with the same complaint. Previous visits she had negative HCG after reporting positive HPT.  Reports 11 days late for period & claims that she has regular menses every month. Came off of depo provera last year; no other form of contraception.  Reports nausea, vomiting, abdominal cramping, & breast tenderness. Previously prescribed zofran which she took once this morning. States she just wants to know if she's pregnant. Has not established with PCP yet.   Past Medical History:  Diagnosis Date  . Anxiety   . Kidney stones    OB History  Gravida Para Term Preterm AB Living  1 1 1     1   SAB TAB Ectopic Multiple Live Births               # Outcome Date GA Lbr Len/2nd Weight Sex Delivery Anes PTL Lv  1 Term     F         Past Surgical History:  Procedure Laterality Date  . STENT PLACEMENT NON-VASCULAR ([facility] HX)     renal   Social History   Socioeconomic History  . Marital status: Single    Spouse name: Not on file  . Number of children: Not on file  . Years of education: Not on file  . Highest education level: Not on file  Social Needs  . Financial resource strain: Not on file  . Food insecurity - worry: Not on file  . Food insecurity - inability: Not on file  . Transportation needs - medical: Not on file  . Transportation needs - non-medical: Not on file  Occupational History  . Not on file  Tobacco Use  . Smoking status: Current Every Day Smoker    Packs/day: 0.25    Years: 5.00    Pack years: 1.25    Types: Cigarettes  . Smokeless tobacco: Never Used  Substance and Sexual Activity  . Alcohol use: Yes    Comment: Socially; denies  today  . 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 Sexual activity: Yes    Birth control/protection: None  Other Topics Concern  . Not on file  Social History Narrative  . Not on file   No current facility-administered medications on file prior to encounter.    Current Outpatient Medications on File Prior to Encounter  Medication Sig Dispense Refill  . ALPRAZolam (XANAX) 1 MG tablet Take 1 mg by mouth 2 (two) times daily as needed for anxiety.    . ondansetron (ZOFRAN ODT) 8 MG disintegrating tablet Take 1 tablet (8 mg total) by mouth 2 (two) times daily. 12 tablet 0   Allergies  Allergen Reactions  . Penicillins Rash    Has patient had a PCN reaction causing immediate rash, facial/tongue/throat swelling, SOB or lightheadedness with hypotension: No Has patient had a PCN reaction causing severe rash involving mucus membranes or skin necrosis: No Has patient had a PCN reaction that required hospitalization: No Has patient had a PCN reaction occurring within the last 10 years: No If all of the above answers are "NO", then may proceed with Cephalosporin use.  I have reviewed the past Medical Hx, Surgical Hx, Social Hx, Allergies and Medications.   Review of Systems  Constitutional: Negative.   Gastrointestinal: Positive for abdominal pain, nausea and vomiting. Negative for constipation and diarrhea.  Genitourinary: Negative.     OBJECTIVE No data found. Constitutional: Well-developed, well-nourished female in no acute distress.  Cardiovascular: normal rate Respiratory: normal rate and effort.  GI: Abd soft, non-tender, gravid appropriate for gestational age. Pos BS x 4 MS: Extremities nontender, no edema, normal ROM Neurologic: Alert and oriented x 4.    LAB RESULTS Results for orders placed or performed during the hospital encounter of 03/20/17 (from the past 48 hour(s))  Urinalysis, Routine w reflex microscopic     Status: Abnormal    Collection Time: 03/20/17  4:29 PM  Result Value Ref Range   Color, Urine YELLOW YELLOW   APPearance HAZY (A) CLEAR   Specific Gravity, Urine 1.020 1.005 - 1.030   pH 5.0 5.0 - 8.0   Glucose, UA NEGATIVE NEGATIVE mg/dL   Hgb urine dipstick NEGATIVE NEGATIVE   Bilirubin Urine NEGATIVE NEGATIVE   Ketones, ur 20 (A) NEGATIVE mg/dL   Protein, ur 30 (A) NEGATIVE mg/dL   Nitrite NEGATIVE NEGATIVE   Leukocytes, UA NEGATIVE NEGATIVE   RBC / HPF 0-5 0 - 5 RBC/hpf   WBC, UA 0-5 0 - 5 WBC/hpf   Bacteria, UA NONE SEEN NONE SEEN   Squamous Epithelial / LPF 0-5 (A) NONE SEEN   Mucus PRESENT   CBC     Status: Abnormal   Collection Time: 03/20/17  5:29 PM  Result Value Ref Range   WBC 6.1 4.0 - 10.5 K/uL   RBC 3.84 (L) 3.87 - 5.11 MIL/uL   Hemoglobin 12.1 12.0 - 15.0 g/dL   HCT 08.635.9 (L) 57.836.0 - 46.946.0 %   MCV 93.5 78.0 - 100.0 fL   MCH 31.5 26.0 - 34.0 pg   MCHC 33.7 30.0 - 36.0 g/dL   RDW 62.912.9 52.811.5 - 41.315.5 %   Platelets 156 150 - 400 K/uL  hCG, serum, qualitative     Status: None   Collection Time: 03/20/17  5:29 PM  Result Value Ref Range   Preg, Serum NEGATIVE NEGATIVE    Comment:        THE SENSITIVITY OF THIS METHODOLOGY IS >10 mIU/mL.      IMAGING No results found.  MAU COURSE Negative UPT --- HCG ordered, negative Offered nausea meds; patient declines MDM  ASSESSMENT 1. Non-pregnancy nausea   2. Pregnancy examination or test, negative result     PLAN Discharge home in stable condition. Establish care with PCP  Follow-up Information    Establish care with a primary care provider-go to ED for worsening symptoms Follow up.          Allergies as of 03/20/2017      Reactions   Penicillins Rash   Has patient had a PCN reaction causing immediate rash, facial/tongue/throat swelling, SOB or lightheadedness with hypotension: No Has patient had a PCN reaction causing severe rash involving mucus membranes or skin necrosis: No Has patient had a PCN reaction that required  hospitalization: No Has patient had a PCN reaction occurring within the last 10 years: No If all of the above answers are "NO", then may proceed with Cephalosporin use.      Medication List    TAKE these medications   ALPRAZolam 1 MG tablet Commonly known as:  XANAX Take 1 mg by mouth 2 (two) times  daily as needed for anxiety.   ondansetron 8 MG disintegrating tablet Commonly known as:  ZOFRAN ODT Take 1 tablet (8 mg total) by mouth 2 (two) times daily.        Judeth Horn, NP 03/21/2017  11:19 PM

## 2017-03-20 NOTE — Discharge Instructions (Signed)
[place] Resource Guide (Revised August 2014)    Insufficient Money for Medicine:           United Way: call "211"   MAP Program at [facility] [place] [phone] or [place] [phone]            No Primary Care Doctor:  To locate a primary care doctor that accepts your insurance or provides certain services:           [place] Connect: [phone]           Physician Referral Service: 1-800-533-3463 ask for "My [place]" If no insurance, you need to see if you qualify for GCCN "orange card", call to set      up appointment for eligibility/enrollment at [phone] or [phone] or visit [place] [facility] ([address], [place] and [address] -[place]) to meet with a GCCN enrollment specialist.  Agencies that provide inexpensive (sliding fee scale) medical care:      [facility] - [facility] at [place] - [phone]    [facility]  -  [facility] - [phone]    [facility] - [phone]    [facility] - [phone]    [facility] - [phone]    [facility] - [phone] [facility] - [facility] [place] - [phone]     [phone] [facility] - [facility] [place] - [phone] [facility]: [phone]  [facility]: [phone]  [facility]anned Parenthood: [phone]  [facility] - [phone]- [phone]    Medicaid-accepting [place] Providers:           [facility] - [phone] (No Family Planning accepted)          [address], [phone], Mon-Fri 9am-5pm          [facility] - [phone], [address], Mon-Thursday 8am-5pm, Fri 8am-noon [facility] - [phone]          [address],  [address], Mon-Fri 7:30am-4:30pm          [facility] - [phone]          [address] [place] [address], Mon-Fri 8am-5pm          [facility] - [phone]          [address]          Only accepts [place] Access Medicaid patients after they have their name applied to their card  Self Pay (no insurance) in [place]:           Sickle Cell Patients:  [address], [phone] [facility]: [address], [place] [phone]       [facility] [address], [place] [phone]  [facility]: [address], [phone]          [facility]           [address], [phone] [facility] [address], [phone]           [facility]             [address], [place] [phone]        [facility] - [address]           [phone], Mon-Fri 9am-7pm, Sat 9am-1pm          [facility] - [facility] [place]          [address] [place] [address], [phone]          [facility] [place]           [address], [phone] [facility] - [facility] [place] [address], [place] [phone]          [facility]           [address] [place] [address], [phone]  [facility] - [facility]  [address], [phone] [facility] - [facility] [address], [phone]  Dr. Osei-Bonsu           [address], [place], [phone]          [facility]           [address], [phone]          [facility]             [address], [phone]          [facility]           [address], [phone], 1st & 3rd Saturday every month, 10am-1pm OTHERS:  [facility]  (Immigration Access Clinic Only)  [phone] (Thursday only) Strategies for finding a Primary Care Provider:  1) Find a Doctor and Pay Out of Pocket  Although you won't have to find out who is covered by your insurance plan, it is a good idea to ask around and get recommendations. You will then need to call the office and see if the doctor you have chosen will accept you as a new patient and what types of options they offer for patients who are self-pay. Some doctors offer discounts or will set up payment plans for their patients who do not have insurance, but you will need to ask so you aren't surprised when you get to your appointment.  2) Contact [facility] - To see if you qualify for "orange card" access to healthcare safety net providers.  Call for appointment for eligibility/enrollment at [phone] or [phone]. (Uninsured, 0-200% FPL, qualifying info)  Applicants for GCCN are first required to see if they are eligible to enroll in the ACA Marketplace before enrolling in GCCN (and get an exemption if they are not).  GCCN Criteria for acceptance is:  Proof of ACA Marketing exemption - form or documentation  Valid photo ID (driver's license, state identification card, passport, home country ID)  Proof of [place] residency (e.g. driver's license, lease/landlord information, pay stubs with address, utility bill, bank statement, etc.)  Proof of income (1040, last year's tax return, W2, 4 current pay stubs, other income proof)  Proof of assets (current bank statement + 3 most recent, disability paperwork, life insurance info, tax value on autos, etc.)  3) Contact Your Local Health Department  Not all health departments have doctors that can see patients for sick visits,   but many do, so it is worth a call to see if yours does. If you don't know where your local health  department is, you can check in your phone book. The CDC also has a tool to help you locate your state's health department, and many state websites also have listings of all of their local health departments.  4) Find a Walk-in Clinic  If your illness is not likely to be very severe or complicated, you may want to try a walk in clinic. These are popping up all over the country in pharmacies, drugstores, and shopping centers. They're usually staffed by nurse practitioners or physician assistants that have been trained to treat common illnesses and complaints. They're usually fairly quick and inexpensive. However, if you have serious medical issues or chronic medical problems, these are probably not your best option   STD Testing:           [place] [facility] [place], STD Clinic           [address], [place], phone [phone] or [phone]           Monday - Friday, call for an appointment          [place] [facility] [place], STD Clinic           [address], [place], phone [phone] or [phone]           Monday - Friday, call for an appointment Abuse/Neglect:           [place] Child Abuse Hotline: 641-3795           [place] Child Abuse Hotline: 800-378-5315 (After Hours) Emergency Shelter:  [facility] (336) 271-5959  [facility] [place]- (336) 881-5415  [facility] [place] - (336) 235-0330  [facility] - (336) 375-1332 (ages 11-17)  Homeless Day Shelter @ [facility] - (336) 332-0824   Mammograms - Free at [facility] [place] - [phone]  Maternity Homes:           [facility]: (336) [phone]   (Homeless mother with children)          [facility]: (704) 372-4663 (Mothers only) [facility]: (336) 370-9232 (Pregnant 16-21 years old) [facility]opt [facility] Mom -(336) 641-7777  [place] Resources  [facility] - [facility] [address], [place] ([phone]) [phone]          [facility] [place]           [address], [place]           [phone]          United Way           [address], [place]           [phone]          [place] [facility]           [address], [place]           [phone]          [place] [facility]           [phone]          [place] [facility] - [facility]           1-888-581-9988            [facility] in [place]           [address]           [phone], Insurance          [place] Child Abuse Hotline           (336) 342-1394           (336) 342-3537 (After Hours)  Behavioral Health Services /Substance Abuse Resources:           Al[facility] Drug Services: 882-2125           [facility]: [phone]          [facility]: (336) 285-9073  Narcotics Helpline - 1-866-375-1272          [facility]: (336) 845-3988           Residential & Outpatient Substance Abuse Program - [facility]: 800-659-3381 [employer]  [facility] - (336) 285-2605 Psychological Services:           Health: [phone]  [facility]: 1-877-626-1772          [facility]           [address] [place] [address], [place]           ACCESS LINE: 1-800-256-2452     (24 Hour) Mobile Crisis:  [place]INES:  [facility] on Mental Illness - [place] (336) 370-4264 [facility] - [place] (800) 451-9682 Walk In Crisis Services       [facility] - [address] [place] [address] - [place]  (336)[phone]        Health - (336)832-9600 or (336) 832-9700  [facility] - [address] - [place] (336)[phone]  [facility] - [address], [place] (336) [phone]   Dental Assistance:   If unable to pay or uninsured, contact: [place] [facility] to become qualified for the adult dental clinic. Patient must be enrolled in GCCN (uninsured, 0-200% FPL, qualifying info).  Enroll in GCCN first, then see Primary Care Physician assigned to you, the PCP makes a dental referral. [facility] will receive referral and contacts patient for appointment.  Patients with Medicaid           [address], [phone]  [facility] (Children up to 20 + Pregnant Women) - (336) [phone]  [facility] - [address] - [address] ([phone]) [phone]  If unable to pay, or uninsured: contact [place] [facility] ([phone] in [place] - (Children only + Pregnant Women), [phone] in [place]- Children only) to become qualified for the adult dental clinic  Must see if eligible to enroll in ACA Health Insurance Marketplace before enrolling into the GCCN (exemption required) (1-855-733-3711 for an appointment)  www.healthcare.gov;   1-800-318-2596.  If not eligible for ACA, then go by Department of Health and Human Services to see if eligible for "orange card."  [address], [place] and [address]- [place].  Once you get an orange card, you will have a Primary Care home who will then refer you to dental if needed.     Other Low-Cost Community Dental Services:   [facility] - [phone] (ext [phone])   [address] [place] [address]  Dr. Civils - [phone]   [address]    [facility] [employer] - [phone]   [address]           [facility]           [address], [place], [place], [address]           [phone], Ext. [phone]           2nd and 4th Thursday of the month at 6:30am (Simple extractions only - no wisdom teeth or surgery) First come/First serve -First 10 clients served           [facility] ([place] and [place] residents only)          [address], [place], [place], [address]            [phone]                    [place] [facility]           [phone]          [facility]          [phone]         [facility]          [phone]   Transportation Options:  Ambulance - 911 - $250-$700 per ride Family Member to accompany patient (if stable) - [place] [employer] - (336) 355-6499  PART - (336) 662-0002  Taxi - (336) [phone] - [place]  SCAT - (336) 333-6589 (Application required)  [place] [facility] - (336) [phone]  

## 2017-03-26 ENCOUNTER — Other Ambulatory Visit: Payer: Self-pay

## 2017-03-26 ENCOUNTER — Emergency Department (HOSPITAL_COMMUNITY)
Admission: EM | Admit: 2017-03-26 | Discharge: 2017-03-26 | Disposition: A | Discharge status: Home / Self Care | Payer: Medicaid Other | Attending: Emergency Medicine | Admitting: Emergency Medicine

## 2017-03-26 ENCOUNTER — Emergency Department (HOSPITAL_COMMUNITY): Payer: Medicaid Other

## 2017-03-26 DIAGNOSIS — R1084 Generalized abdominal pain: Secondary | ICD-10-CM | POA: Diagnosis not present

## 2017-03-26 DIAGNOSIS — R110 Nausea: Secondary | ICD-10-CM

## 2017-03-26 DIAGNOSIS — F419 Anxiety disorder, unspecified: Secondary | ICD-10-CM | POA: Insufficient documentation

## 2017-03-26 DIAGNOSIS — R112 Nausea with vomiting, unspecified: Secondary | ICD-10-CM | POA: Insufficient documentation

## 2017-03-26 DIAGNOSIS — R109 Unspecified abdominal pain: Secondary | ICD-10-CM | POA: Diagnosis present

## 2017-03-26 DIAGNOSIS — F17210 Nicotine dependence, cigarettes, uncomplicated: Secondary | ICD-10-CM | POA: Insufficient documentation

## 2017-03-26 DIAGNOSIS — R5383 Other fatigue: Secondary | ICD-10-CM | POA: Diagnosis not present

## 2017-03-26 LAB — CBC
HCT: 38.2 % (ref 36.0–46.0)
Hemoglobin: 12.6 g/dL (ref 12.0–15.0)
MCH: 30.8 pg (ref 26.0–34.0)
MCHC: 33 g/dL (ref 30.0–36.0)
MCV: 93.4 fL (ref 78.0–100.0)
Platelets: 174 K/uL (ref 150–400)
RBC: 4.09 MIL/uL (ref 3.87–5.11)
RDW: 12.4 % (ref 11.5–15.5)
WBC: 6.1 K/uL (ref 4.0–10.5)

## 2017-03-26 LAB — URINALYSIS, ROUTINE W REFLEX MICROSCOPIC
Bilirubin Urine: NEGATIVE
Glucose, UA: NEGATIVE mg/dL
Hgb urine dipstick: NEGATIVE
Ketones, ur: 5 mg/dL — AB
Leukocytes, UA: NEGATIVE
Nitrite: NEGATIVE
Protein, ur: NEGATIVE mg/dL
Specific Gravity, Urine: 1.024 (ref 1.005–1.030)
pH: 6 (ref 5.0–8.0)

## 2017-03-26 LAB — COMPREHENSIVE METABOLIC PANEL WITH GFR
ALT: 10 U/L — ABNORMAL LOW (ref 14–54)
AST: 13 U/L — ABNORMAL LOW (ref 15–41)
Albumin: 4.2 g/dL (ref 3.5–5.0)
Alkaline Phosphatase: 69 U/L (ref 38–126)
Anion gap: 7 (ref 5–15)
BUN: 8 mg/dL (ref 6–20)
CO2: 25 mmol/L (ref 22–32)
Calcium: 9.3 mg/dL (ref 8.9–10.3)
Chloride: 106 mmol/L (ref 101–111)
Creatinine, Ser: 0.83 mg/dL (ref 0.44–1.00)
GFR calc Af Amer: >60 mL/min (ref >60)
GFR calc non Af Amer: >60 mL/min (ref >60)
Glucose, Bld: 90 mg/dL (ref 65–99)
Potassium: 3.6 mmol/L (ref 3.5–5.1)
Sodium: 138 mmol/L (ref 135–145)
Total Bilirubin: 0.8 mg/dL (ref 0.3–1.2)
Total Protein: 7.1 g/dL (ref 6.5–8.1)

## 2017-03-26 LAB — I-STAT BETA HCG BLOOD, ED (MC, WL, AP ONLY): I-stat hCG, quantitative: <5 m[IU]/mL (ref <5)

## 2017-03-26 LAB — LIPASE, BLOOD: Lipase: 20 U/L (ref 11–51)

## 2017-03-26 MED ORDER — PROMETHAZINE HCL 25 MG PO TABS: 25.0000 mg | ORAL_TABLET | Freq: Four times a day (QID) | ORAL | 0 refills | Status: DC | PRN

## 2017-03-26 MED ORDER — IOPAMIDOL (ISOVUE-300) INJECTION 61%
INTRAVENOUS | Status: AC
  Administered 2017-03-26: 100 mL
  Filled 2017-03-26: qty 100

## 2017-03-26 MED ORDER — ONDANSETRON HCL 4 MG/2ML IJ SOLN
4.0000 mg | Freq: Once | INTRAMUSCULAR | Status: DC
  Filled 2017-03-26: qty 2

## 2017-03-26 MED ORDER — PROMETHAZINE HCL 25 MG/ML IJ SOLN
25.0000 mg | Freq: Once | INTRAMUSCULAR | Status: AC
  Administered 2017-03-26: 25 mg via INTRAVENOUS
  Filled 2017-03-26: qty 1

## 2017-03-26 NOTE — ED Provider Notes (Signed)
[facility] EMERGENCY DEPARTMENT Provider Note   CSN: [id number] Arrival date & time: 03/26/17  1241     History   Chief Complaint Chief Complaint  Patient presents with  . Abdominal Pain  . Emesis  . Diarrhea    HPI Patty Mata is a 26 y.o. female.  HPI   26 year old female presents today with complaints of abdominal pain.  Patient notes symptoms started approximately 1 month ago but worsened over the last 10 days.  She notes diffuse abdominal pain, nausea, vomiting.  She reports originally she thought she was pregnant she had positive pregnancy test at home, but was evaluated at the [facility] with 2 normal pregnancy test.  Patient notes she has been seen at urgent care with thyroid studies show no significant abnormality.  Patient notes little p.o. intake with approximately 15-20 pound weight loss over the last 3 weeks.  She notes her urine has been slightly darker than normal.  Patient notes her menstrual cycles are regular, recently of her menstrual cycle last night.  No fevers at home.  Patient reports that she was having vaginal discharge, was evaluated [facility] with no significant findings, this is unchanged.    Past Medical History:  Diagnosis Date  . Anxiety   . Kidney stones     There are no active problems to display for this patient.   Past Surgical History:  Procedure Laterality Date  . STENT PLACEMENT NON-VASCULAR ([facility] HX)     renal    OB History    Gravida Para Term Preterm AB Living   1 1 1     1    SAB TAB Ectopic Multiple Live Births                   Home Medications    Prior to Admission medications   Medication Sig Start Date End Date Taking? Authorizing Provider  ALPRAZolam Prudy Feeler) 1 MG tablet Take 1 mg by mouth 2 (two) times daily as needed for anxiety.   Yes [provider]  ondansetron (ZOFRAN ODT) 8 MG disintegrating tablet Take 1 tablet (8 mg total) by mouth 2 (two) times daily. Patient not taking: Reported on  03/26/2017 03/14/17   Currie Paris, NP  promethazine (PHENERGAN) 25 MG tablet Take 1 tablet (25 mg total) by mouth every 6 (six) hours as needed for nausea or vomiting. 03/26/17   Eyvonne Mechanic, PA-C    Family History Family History  Problem Relation Age of Onset  . Depression Mother   . Arthritis Mother   . Asthma Father   . Mental illness Father     Social History Social History   Tobacco Use  . Smoking status: Current Every Day Smoker    Packs/day: 0.25    Years: 5.00    Pack years: 1.25    Types: Cigarettes  . Smokeless tobacco: Never Used  Substance Use Topics  . Alcohol use: Yes    Comment: Socially; denies today  . 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    Physical Exam Updated Vital Signs BP 111/81   Pulse 65   Temp 97.8 F (36.6 C) (Oral)   Resp 18   Ht 5\' 1"  (1.549 m)   LMP 03/02/2017 (Exact Date)   SpO2 97%   BMI 22.30 kg/m   Physical  Exam  Constitutional: She is oriented to person, place, and time. She appears well-developed and well-nourished.  HENT:  Head: Normocephalic and atraumatic.  Eyes: Conjunctivae are normal. Pupils are equal, round, and reactive to light. Right eye exhibits no discharge. Left eye exhibits no discharge. No scleral icterus.  Neck: Normal range of motion. No JVD present. No tracheal deviation present.  Pulmonary/Chest: Effort normal and breath sounds normal. No stridor.  Abdominal: Soft. She exhibits no distension and no mass. There is tenderness. There is no rebound and no guarding. No hernia.  Soft abdomen with diffuse tenderness  Neurological: She is alert and oriented to person, place, and time. Coordination normal.  Skin: Skin is warm.  Psychiatric: She has a normal mood and affect. Her behavior is normal. Judgment and thought content normal.  Nursing note and vitals  reviewed.    ED Treatments / Results  Labs (all labs ordered are listed, but only abnormal results are displayed) Labs Reviewed  COMPREHENSIVE METABOLIC PANEL - Abnormal; Notable for the following components:      Result Value   AST 13 (*)    ALT 10 (*)    All other components within normal limits  URINALYSIS, ROUTINE W REFLEX MICROSCOPIC - Abnormal; Notable for the following components:   APPearance HAZY (*)    Ketones, ur 5 (*)    Bacteria, UA FEW (*)    Squamous Epithelial / LPF 0-5 (*)    All other components within normal limits  LIPASE, BLOOD  CBC  I-STAT BETA HCG BLOOD, ED ([facility], [facility], [facility] ONLY)    EKG  EKG Interpretation None       Radiology Ct Abdomen Pelvis W Contrast  Result Date: 03/26/2017 CLINICAL DATA:  26 year old female with history of nausea, emesis and diarrhea for 1 month. Weakness and fatigue. EXAM: CT ABDOMEN AND PELVIS WITH CONTRAST TECHNIQUE: Multidetector CT imaging of the abdomen and pelvis was performed using the standard protocol following bolus administration of intravenous contrast. CONTRAST:  100mL ISOVUE-300 IOPAMIDOL (ISOVUE-300) INJECTION 61% COMPARISON:  CT the abdomen and pelvis 03/30/2014. FINDINGS: Lower chest: Unremarkable. Hepatobiliary: Focal area of fatty infiltration or benign perfusion anomaly noted adjacent to the falciform ligament in segment 4B of the liver (a benign finding). No suspicious cystic or solid hepatic lesions. No intra or extrahepatic biliary ductal dilatation. Gallbladder is normal in appearance. Pancreas: No pancreatic mass. No pancreatic ductal dilatation. No pancreatic or peripancreatic fluid or inflammatory changes. Spleen: Unremarkable. Adrenals/Urinary Tract: There are 2 mm nonobstructive calculi in the interpolar and lower pole collecting system of the left kidney. No ureteral stones. Bilateral kidneys and bilateral adrenal glands are otherwise normal in appearance. No hydroureteronephrosis. Urinary bladder is normal in  appearance. Stomach/Bowel: Normal appearance of the stomach. No pathologic dilatation of small bowel or colon. Normal appendix. Vascular/Lymphatic: No significant atherosclerotic disease, aneurysm or dissection noted in the abdominal or pelvic vasculature. No lymphadenopathy noted in the abdomen or pelvis. Reproductive: Uterus and ovaries are unremarkable in appearance. Other: No significant volume of ascites.  No pneumoperitoneum. Musculoskeletal: There are no aggressive appearing lytic or blastic lesions noted in the visualized portions of the skeleton. IMPRESSION: 1. No acute findings are noted in the abdomen or pelvis to account for the patient's symptoms. 2. Normal appendix. 3. Two small 2 mm nonobstructive calculi in the interpolar and lower pole collecting system of left kidney. No ureteral stones or findings of urinary tract obstruction. Electronically Signed   By: Trudie Reedaniel  Entrikin M.D.   On: 03/26/2017 15:16  Procedures Procedures (including critical care time)  Medications Ordered in ED Medications  iopamidol (ISOVUE-300) 61 % injection (100 mLs  Contrast Given 03/26/17 1453)  promethazine (PHENERGAN) injection 25 mg (25 mg Intravenous Given 03/26/17 1448)     Initial Impression / Assessment and Plan / ED Course  I have reviewed the triage vital signs and the nursing notes.  Pertinent labs & imaging results that were available during my care of the patient were reviewed by me and considered in my medical decision making (see chart for details).     Final Clinical Impressions(s) / ED Diagnoses   Final diagnoses:  Generalized abdominal pain  Nausea    Labs: Urinalysis, CBC, CMP, lipase  Imaging: CT abdomen and Pelvis   Consults:  Therapeutics:  Discharge Meds:   Assessment/Plan: 26 year old female presents today with complaints of nausea vomiting abdominal pain.  This appears to be chronic in nature with worsening recently.  She is well-appearing in no acute distress.   She has reassuring laboratory analysis, reassuring CT scan.  Uncertain etiology of patient's ongoing symptoms.  Patient will be referred to for ongoing evaluation and management, encouraged to establish care with primary care provider.  Patient given strict return precautions, she verbalized understanding and agreement to today's plan had no further questions or concerns at the time of discharge.    ED Discharge Orders        Ordered    promethazine (PHENERGAN) 25 MG tablet  Every 6 hours PRN     03/26/17 1551       Eyvonne MechanicHedges, Karren Newland, Cordelia Poche-C 03/26/17 1556    Doug SouJacubowitz, Sam, MD 03/26/17 1820

## 2017-03-26 NOTE — ED Notes (Signed)
Returned from ct 

## 2017-03-26 NOTE — Discharge Instructions (Signed)
Please read attached information. If you experience any new or worsening signs or symptoms please return to the emergency room for evaluation. Please follow-up with your primary care provider or specialist as discussed. Please use attached resources to help find primary care doctor.  Please use medication prescribed only as directed and discontinue taking if you have any concerning signs or symptoms.

## 2017-03-26 NOTE — ED Triage Notes (Addendum)
Pt states she has had nausea, emesis and diarrhea for over 1 month, with weakness and fatigue. Pt states significant weight loss. Pt feels like she is going to faint all of the time. Pt states she refuses blood work until she get her IV and does not want to be stuck too much.

## 2017-03-26 NOTE — ED Notes (Signed)
Patient transported to CT 

## 2017-04-01 NOTE — L&D Delivery Note (Addendum)
Patient: Patty Mata MRN: [id number]  GBS status: negative  Patient is a 27 y.o. now G2P2 s/p NSVD at 8018w4d, who was admitted for IOL due to ICP, nephrolithiasis. SROM 0h 2819m prior to delivery with clear fluid. Thin meconium observed at the delivery of the body. Head delivered LOA. Body cord present. Shoulder and body delivered in usual fashion. Infant with spontaneous cry, placed on mother's abdomen, dried and bulb suctioned. Cord clamped x 2 after 1-minute delay, and cut by family member. Cord blood drawn. Placenta delivered spontaneously with gentle cord traction. Fundus firm with massage and Pitocin. Perineum inspected and found to have no laceration.  Bleeding continued at slow trickle following delivery. Cervix walked out with ring forceps and found intact. Bleeding determined to be from above, most likely related to poor tone of lower uterine segment. Bimanual massage with removal of several moderate sized clots. Methergine and cytotec with improved hemostasis.   Delivery Note At 11:38 AM a viable female was delivered via Vaginal, Spontaneous (Presentation: vertex; LOA  ).  APGAR: 9, 9; weight  3033g Placenta status: intact, 3 vessel cord.   Anesthesia: Epidural  Episiotomy: None Lacerations: None Est. Blood Loss (mL): 421 + additional soaked pad not weighed, estimate 600cc prior to transfer to antepartum.  Mom to postpartum.  Baby to Couplet care / Skin to Skin.  [name] 03/04/2018, 12:21 PM  Attestation: I have seen this patient and agree with the resident's documentation. I was present and assisted with the delivery and management of the postpartum bleeding. Patient also with elevated blood pressures immediately postpartum. Observed on L&D for about 2 additional hours with persistent elevated blood pressures moderate range 150s systolic and headache. Given known elevated LFTs without clear source, will start magnesium for seizure prophylaxis in setting of possible developing  postpartum preeclampsia/HELLP. Labs rechecked and stable, LFTs actually slightly improved. Plan for BTL in morning given consent signed 02/03/18. Will go to OB high risk given magnesium.   [name], DO OB/GYN Fellow

## 2017-04-11 ENCOUNTER — Emergency Department
Admission: EM | Admit: 2017-04-11 | Discharge: 2017-04-11 | Disposition: A | Discharge status: Home / Self Care | Payer: Medicaid Other

## 2017-04-14 ENCOUNTER — Emergency Department (HOSPITAL_COMMUNITY)
Admission: EM | Admit: 2017-04-14 | Discharge: 2017-04-14 | Disposition: A | Discharge status: Home / Self Care | Payer: Medicaid Other | Attending: Emergency Medicine | Admitting: Emergency Medicine

## 2017-04-14 ENCOUNTER — Other Ambulatory Visit: Payer: Self-pay

## 2017-04-14 ENCOUNTER — Encounter (HOSPITAL_COMMUNITY): Payer: Self-pay

## 2017-04-14 DIAGNOSIS — R110 Nausea: Secondary | ICD-10-CM | POA: Insufficient documentation

## 2017-04-14 DIAGNOSIS — Z5321 Procedure and treatment not carried out due to patient leaving prior to being seen by health care provider: Secondary | ICD-10-CM | POA: Diagnosis not present

## 2017-04-14 NOTE — ED Triage Notes (Signed)
Pt reports nausea for one month. Pt also has a lump in breast. Pt wants blood work done for Cancer. Pt reports she has been thrown all over the place with out any answers.

## 2017-04-14 NOTE — ED Triage Notes (Signed)
[facility] and left with out being seen .

## 2017-04-14 NOTE — ED Triage Notes (Signed)
Pt reports she keeps being told she needs to see a specialist and is tired of being told that. Family member in room asked can't you check for cancer if you draw blood.

## 2017-04-14 NOTE — ED Triage Notes (Signed)
Pt states she has been seen for nausea multiple times over the last couple of months. Pt reports she is continuing to miss work. PT reports she also found lump on right breat. Pt states she is very anxious

## 2017-04-14 NOTE — ED Triage Notes (Signed)
PT at desk and reported she was going to leave.

## 2017-04-17 NOTE — ED Notes (Signed)
04/17/2017, Spoke with pt. Concerning LWBS,  Pt. Will be coming back to be evaluated.  Pt. Very thankful for the follow-up call.

## 2017-07-02 ENCOUNTER — Other Ambulatory Visit: Payer: Self-pay

## 2017-07-02 ENCOUNTER — Emergency Department (HOSPITAL_COMMUNITY)
Admission: EM | Admit: 2017-07-02 | Discharge: 2017-07-02 | Disposition: A | Discharge status: Home / Self Care | Payer: Medicaid Other | Attending: Emergency Medicine | Admitting: Emergency Medicine

## 2017-07-02 ENCOUNTER — Encounter (HOSPITAL_COMMUNITY): Payer: Self-pay | Admitting: Emergency Medicine

## 2017-07-02 DIAGNOSIS — Z79899 Other long term (current) drug therapy: Secondary | ICD-10-CM | POA: Diagnosis not present

## 2017-07-02 DIAGNOSIS — F17210 Nicotine dependence, cigarettes, uncomplicated: Secondary | ICD-10-CM | POA: Insufficient documentation

## 2017-07-02 DIAGNOSIS — N3090 Cystitis, unspecified without hematuria: Secondary | ICD-10-CM | POA: Insufficient documentation

## 2017-07-02 DIAGNOSIS — R350 Frequency of micturition: Secondary | ICD-10-CM | POA: Diagnosis present

## 2017-07-02 LAB — COMPREHENSIVE METABOLIC PANEL WITH GFR
ALT: 10 U/L — ABNORMAL LOW (ref 14–54)
AST: 15 U/L (ref 15–41)
Albumin: 4.1 g/dL (ref 3.5–5.0)
Alkaline Phosphatase: 72 U/L (ref 38–126)
Anion gap: 10 (ref 5–15)
BUN: 13 mg/dL (ref 6–20)
CO2: 24 mmol/L (ref 22–32)
Calcium: 9.1 mg/dL (ref 8.9–10.3)
Chloride: 103 mmol/L (ref 101–111)
Creatinine, Ser: 0.86 mg/dL (ref 0.44–1.00)
GFR calc Af Amer: >60 mL/min (ref >60)
GFR calc non Af Amer: >60 mL/min (ref >60)
Glucose, Bld: 102 mg/dL — ABNORMAL HIGH (ref 65–99)
Potassium: 3.6 mmol/L (ref 3.5–5.1)
Sodium: 137 mmol/L (ref 135–145)
Total Bilirubin: 0.7 mg/dL (ref 0.3–1.2)
Total Protein: 6.9 g/dL (ref 6.5–8.1)

## 2017-07-02 LAB — CBC WITH DIFFERENTIAL/PLATELET
BASOS ABS: 0 10*3/uL (ref 0.0–0.1)
Basophils Relative: 1 %
Eosinophils Absolute: 0.1 10*3/uL (ref 0.0–0.7)
Eosinophils Relative: 1 %
HEMATOCRIT: 38.4 % (ref 36.0–46.0)
HEMOGLOBIN: 12.5 g/dL (ref 12.0–15.0)
LYMPHS ABS: 2.9 10*3/uL (ref 0.7–4.0)
LYMPHS PCT: 36 %
MCH: 30 pg (ref 26.0–34.0)
MCHC: 32.6 g/dL (ref 30.0–36.0)
MCV: 92.3 fL (ref 78.0–100.0)
Monocytes Absolute: 0.5 10*3/uL (ref 0.1–1.0)
Monocytes Relative: 6 %
NEUTROS ABS: 4.5 10*3/uL (ref 1.7–7.7)
Neutrophils Relative %: 56 %
Platelets: 180 10*3/uL (ref 150–400)
RBC: 4.16 MIL/uL (ref 3.87–5.11)
RDW: 12.6 % (ref 11.5–15.5)
WBC: 8 10*3/uL (ref 4.0–10.5)

## 2017-07-02 LAB — WET PREP, GENITAL
Clue Cells Wet Prep HPF POC: NONE SEEN
Sperm: NONE SEEN
Trich, Wet Prep: NONE SEEN
Yeast Wet Prep HPF POC: NONE SEEN

## 2017-07-02 LAB — URINALYSIS, ROUTINE W REFLEX MICROSCOPIC
Bilirubin Urine: NEGATIVE
Glucose, UA: NEGATIVE mg/dL
HGB URINE DIPSTICK: NEGATIVE
Ketones, ur: NEGATIVE mg/dL
Leukocytes, UA: NEGATIVE
Nitrite: NEGATIVE
Protein, ur: NEGATIVE mg/dL
SPECIFIC GRAVITY, URINE: 1.026 (ref 1.005–1.030)
pH: 5 (ref 5.0–8.0)

## 2017-07-02 LAB — I-STAT BETA HCG BLOOD, ED (MC, WL, AP ONLY): I-stat hCG, quantitative: <5 m[IU]/mL (ref <5)

## 2017-07-02 LAB — POC URINE PREG, ED: PREG TEST UR: NEGATIVE

## 2017-07-02 MED ORDER — NITROFURANTOIN MONOHYD MACRO 100 MG PO CAPS: 100.0000 mg | ORAL_CAPSULE | Freq: Two times a day (BID) | ORAL | 0 refills | Status: AC

## 2017-07-02 NOTE — Discharge Instructions (Addendum)
You are being treated for an urinary tract infection. Please take all of your antibiotics until finished!   You may develop abdominal discomfort or diarrhea from the antibiotic.  You may help offset this with probiotics which you can buy or get in yogurt. Do not eat or take the probiotics until 2 hours after your antibiotic.   Your lab results were encouraging today.  There is still lab results pending, including STD testing.  Despite the encouraging lab results today, it is extremely important that you continue seeking care for the abnormalities you have been experiencing.  This should be done through a primary care provider.  Follow-up with OB/GYN for continued lower abdominal pain and/or abnormal vaginal discharge.  Return to the ED for worsening symptoms.

## 2017-07-02 NOTE — ED Triage Notes (Signed)
PT to ED c/o urinary symptoms - reports frequency, cloudy urine, foul odor, and stinging pain in lower abdomen after urinating. Denies fevers.

## 2017-07-02 NOTE — ED Provider Notes (Signed)
[facility] EMERGENCY DEPARTMENT Provider Note   CSN: [id number] Arrival date & time: 07/02/17  1649     History   Chief Complaint Chief Complaint  Patient presents with  . Urinary Tract Infection    HPI Patty Mata is a 27 y.o. female.  HPI   Patty Mata is a 27 y.o. female, with a history of anxiety and kidney stones, presenting to the ED with urinary frequency for the past 2-4 weeks. Accompanied by suprapubic burning sensation after urination. Dark, foul-smelling urine and intermittent right flank pain for last 2-3 days.  States her symptoms feel like a UTI. Increased white colored discharge for last couple weeks.   Patient also brings up a series of symptoms that she is concerned with.  She endorses 60lb unintended weight loss over last year.  A few days ago she began to have episodes where she would "lose time."  Her significant other at the bedside witnessed 1 of these episodes and described it as follows: "She walked out to the car and sat in the driver seat.  I asked her if she had the keys and she said yes, but she did not start the car.  I told her to go to get the keys.  She got out of the car and went inside.  She again returned without the keys and just stared straight ahead."  She had a separate instance where significant other states he was on the phone with her and she "seemed disoriented." She does admit to regular marijuana use, but denies other illicit drug use.  Denies regular alcohol use. LNMP 05/16/17. 06/13/17 started what she thought should have been a menstrual cycle, but it was lighter and shorter than normal.  Denies fever/chills, N/V/D, abnormal vaginal bleeding, hematuria, dyspareunia, vaginal irritation or pain, syncope, chest pain, shortness of breath, or any other complaints.   Past Medical History:  Diagnosis Date  . Anxiety   . Kidney stones     There are no active problems to display for this patient.   Past Surgical  History:  Procedure Laterality Date  . STENT PLACEMENT NON-VASCULAR ([facility] HX)     renal     OB History    Gravida  1   Para  1   Term  1   Preterm      AB      Living  1     SAB      TAB      Ectopic      Multiple      Live Births               Home Medications    Prior to Admission medications   Medication Sig Start Date End Date Taking? Authorizing Provider  ALPRAZolam (XANAX) 0.25 MG tablet Take 0.25 mg by mouth 4 (four) times daily. 06/14/17  Yes [provider]  Aspirin-Salicylamide-Caffeine (BC HEADACHE POWDER PO) Take 1 packet by mouth as needed (for headaches).   Yes [provider]  promethazine (PHENERGAN) 25 MG tablet Take 1 tablet (25 mg total) by mouth every 6 (six) hours as needed for nausea or vomiting. 03/26/17  Yes Hedges, Tinnie Gens, PA-C  nitrofurantoin, macrocrystal-monohydrate, (MACROBID) 100 MG capsule Take 1 capsule (100 mg total) by mouth 2 (two) times daily for 5 days. 07/02/17 07/07/17  Yulian Gosney C, PA-C  ondansetron (ZOFRAN ODT) 8 MG disintegrating tablet Take 1 tablet (8 mg total) by mouth 2 (two) times daily. Patient not  taking: Reported on 04/14/2017 03/14/17   Currie ParisBurleson, Terri L, NP    Family History Family History  Problem Relation Age of Onset  . Depression Mother   . Arthritis Mother   . Asthma Father   . Mental illness Father     Social History Social History   Tobacco Use  . Smoking status: Current Every Day Smoker    Packs/day: 0.25    Years: 5.00    Pack years: 1.25    Types: Cigarettes  . Smokeless tobacco: Never Used  Substance Use Topics  . Alcohol use: Yes    Comment: Socially; denies today  . 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 Negative for chills, diaphoresis and fever.  Respiratory: Negative for cough and shortness of breath.     Cardiovascular: Negative for chest pain.  Gastrointestinal: Positive for abdominal pain. Negative for blood in stool, diarrhea, nausea and vomiting.  Genitourinary: Positive for frequency and vaginal discharge. Negative for vaginal bleeding.  All other systems reviewed and are negative.    Physical Exam Updated Vital Signs BP (!) 132/97 (BP Location: Right Arm)   Pulse 93   Temp 97.8 F (36.6 C) (Oral)   Resp 14   LMP 06/13/2017 (Approximate)   SpO2 100%   Physical Exam  Constitutional: She appears well-developed and well-nourished. No distress.  HENT:  Head: Normocephalic and atraumatic.  Mouth/Throat: Oropharynx is clear and moist.  Eyes: Pupils are equal, round, and reactive to light. Conjunctivae and EOM are normal.  Neck: Neck supple.  Cardiovascular: Normal rate, regular rhythm, normal heart sounds and intact distal pulses.  Pulmonary/Chest: Effort normal and breath sounds normal. No respiratory distress.  Abdominal: Soft. Normal appearance and bowel sounds are normal. There is tenderness in the suprapubic area. There is no rebound, no guarding, no CVA tenderness, no tenderness at McBurney's point and negative Murphy's sign.  Genitourinary:  Genitourinary Comments: External genitalia normal Vagina with discharge - moderate amount of thick, white discharge in the vaginal vault. Cervix  normal negative for cervical motion tenderness Adnexa palpated, no masses, negative for tenderness noted Bladder palpated negative for tenderness Uterus palpated no masses, negative for tenderness  No inguinal lymphadenopathy. Otherwise normal female genitalia. RN served as Biomedical engineerchaperone during exam.  Musculoskeletal: She exhibits no edema or tenderness.  Lymphadenopathy:    She has no cervical adenopathy.  Neurological: She is alert.  No sensory deficits.  No noted speech deficits. No aphasia. Patient handles oral secretions without difficulty. No noted swallowing defects.  Equal grip  strength bilaterally. Strength 5/5 in the upper extremities. Strength 5/5 with flexion and extension of the hips, knees, and ankles bilaterally.  Negative Romberg. No gait disturbance.  Coordination intact including heel to shin and finger to nose.  Cranial nerves III-XII grossly intact.  No facial droop.   Skin: Skin is warm and dry. She is not diaphoretic.  Psychiatric: She has a normal mood and affect. Her behavior is normal.  Nursing note and vitals reviewed.    ED Treatments / Results  Labs (all labs ordered are listed, but only abnormal results are displayed) Labs Reviewed  WET PREP, GENITAL - Abnormal; Notable for the following components:      Result Value   WBC, Wet Prep HPF POC FEW (*)    All other components within normal limits  URINALYSIS, ROUTINE  W REFLEX MICROSCOPIC - Abnormal; Notable for the following components:   APPearance HAZY (*)    All other components within normal limits  COMPREHENSIVE METABOLIC PANEL - Abnormal; Notable for the following components:   Glucose, Bld 102 (*)    ALT 10 (*)    All other components within normal limits  URINE CULTURE  CBC WITH DIFFERENTIAL/PLATELET  RPR  HIV ANTIBODY (ROUTINE TESTING)  POC URINE PREG, ED  I-STAT BETA HCG BLOOD, ED ([facility], [facility], [facility] ONLY)  GC/CHLAMYDIA PROBE AMP ([place]) NOT AT [facility]    EKG None  Radiology No results found.  Procedures Pelvic exam Date/Time: 07/02/2017 7:18 PM Performed by: Anselm Pancoast, PA-C Authorized by: Anselm Pancoast, PA-C  Consent: Verbal consent obtained. Risks and benefits: risks, benefits and alternatives were discussed Consent given by: patient Patient identity confirmed: verbally with patient and provided demographic data Local anesthesia used: no  Anesthesia: Local anesthesia used: no  Sedation: Patient sedated: no  Patient tolerance: Patient tolerated the procedure well with no immediate complications    (including critical care time)  Medications Ordered in  ED Medications - No data to display   Initial Impression / Assessment and Plan / ED Course  I have reviewed the triage vital signs and the nursing notes.  Pertinent labs & imaging results that were available during my care of the patient were reviewed by me and considered in my medical decision making (see chart for details).     Patient presents primarily for symptoms she states are consistent with UTI.  There is no supporting evidence of UTI on UA, though she does have suprapubic tenderness. Current treatment recommendations suggest treating UTI based on patient's symptoms.  Patient's other symptoms, including episodes of disorientation, need further evaluation.  Patient allowed additional lab work, which was unremarkable.  CT of the head was discussed, however, patient declined.  Recommend PCP follow-up on this matter.  Resources given.  The patient was given instructions for home care as well as return precautions. Patient voices understanding of these instructions, accepts the plan, and is comfortable with discharge.  Vitals:   07/02/17 1654 07/02/17 2017  BP: (!) 132/97 109/77  Pulse: 93   Resp: 14 17  Temp: 97.8 F (36.6 C)   TempSrc: Oral   SpO2: 100% 100%     Final Clinical Impressions(s) / ED Diagnoses   Final diagnoses:  Cystitis    ED Discharge Orders        Ordered    nitrofurantoin, macrocrystal-monohydrate, (MACROBID) 100 MG capsule  2 times daily     07/02/17 2000       Anselm Pancoast, PA-C 07/02/17 2056    Little, Ambrose Finland, MD 07/04/17 0030

## 2017-07-02 NOTE — ED Notes (Signed)
ED Provider at bedside. 

## 2017-07-02 NOTE — ED Notes (Signed)
Pt repeatedly leaving room to go outside. Nurse first informed RN

## 2017-07-03 LAB — RPR: RPR: NONREACTIVE

## 2017-07-03 LAB — GC/CHLAMYDIA PROBE AMP (~~LOC~~) NOT AT ARMC
Chlamydia: NEGATIVE
Neisseria Gonorrhea: NEGATIVE

## 2017-07-03 LAB — HIV ANTIBODY (ROUTINE TESTING W REFLEX): HIV SCREEN 4TH GENERATION: NONREACTIVE

## 2017-07-05 LAB — URINE CULTURE

## 2017-07-06 ENCOUNTER — Telehealth: Payer: Self-pay

## 2017-07-06 NOTE — Telephone Encounter (Signed)
Post ED Visit - Positive Culture Follow-up  Culture report reviewed by antimicrobial stewardship pharmacist:  []  Enzo BiNathan Batchelder, Pharm.D. []  Celedonio MiyamotoJeremy Frens, Pharm.D., BCPS AQ-ID []  Garvin FilaMike Maccia, Pharm.D., BCPS [x]  Georgina PillionElizabeth Martin, Pharm.D., BCPS []  [place] Farms[place]h Pham, VermontPharm.D., BCPS, AAHIVP []  Estella HuskMichelle Turner, Pharm.D., BCPS, AAHIVP []  Lysle Pearlachel Rumbarger, PharmD, BCPS []  Blake DivineShannon Parkey, PharmD []  Pollyann SamplesAndy Johnston, PharmD, BCPS  Positive urine culture Treated with Macrobid, organism sensitive to the same and no further patient follow-up is required at this time.  Jerry CarasCullom, Zuly Belkin Burnett 07/06/2017, 11:38 AM

## 2017-07-09 ENCOUNTER — Emergency Department (HOSPITAL_COMMUNITY): Payer: Medicaid Other

## 2017-07-09 ENCOUNTER — Other Ambulatory Visit: Payer: Self-pay

## 2017-07-09 ENCOUNTER — Emergency Department (HOSPITAL_COMMUNITY)
Admission: EM | Admit: 2017-07-09 | Discharge: 2017-07-09 | Discharge status: Against Medical Advice | Payer: Medicaid Other

## 2017-07-09 ENCOUNTER — Emergency Department (HOSPITAL_COMMUNITY)
Admission: EM | Admit: 2017-07-09 | Discharge: 2017-07-09 | Disposition: A | Discharge status: Home / Self Care | Payer: Medicaid Other | Attending: Emergency Medicine | Admitting: Emergency Medicine

## 2017-07-09 ENCOUNTER — Encounter (HOSPITAL_COMMUNITY): Payer: Self-pay

## 2017-07-09 DIAGNOSIS — N12 Tubulo-interstitial nephritis, not specified as acute or chronic: Secondary | ICD-10-CM | POA: Diagnosis not present

## 2017-07-09 DIAGNOSIS — Z331 Pregnant state, incidental: Secondary | ICD-10-CM | POA: Diagnosis not present

## 2017-07-09 DIAGNOSIS — Z79899 Other long term (current) drug therapy: Secondary | ICD-10-CM | POA: Diagnosis not present

## 2017-07-09 DIAGNOSIS — F17210 Nicotine dependence, cigarettes, uncomplicated: Secondary | ICD-10-CM | POA: Diagnosis not present

## 2017-07-09 DIAGNOSIS — R300 Dysuria: Secondary | ICD-10-CM | POA: Diagnosis present

## 2017-07-09 LAB — CBC WITH DIFFERENTIAL/PLATELET
Basophils Absolute: 0 10*3/uL (ref 0.0–0.1)
Basophils Relative: 0 %
EOS ABS: 0.2 10*3/uL (ref 0.0–0.7)
EOS PCT: 3 %
HCT: 40.6 % (ref 36.0–46.0)
Hemoglobin: 13.2 g/dL (ref 12.0–15.0)
LYMPHS PCT: 25 %
Lymphs Abs: 1.8 10*3/uL (ref 0.7–4.0)
MCH: 30.1 pg (ref 26.0–34.0)
MCHC: 32.5 g/dL (ref 30.0–36.0)
MCV: 92.5 fL (ref 78.0–100.0)
MONO ABS: 0.5 10*3/uL (ref 0.1–1.0)
MONOS PCT: 7 %
Neutro Abs: 4.8 10*3/uL (ref 1.7–7.7)
Neutrophils Relative %: 65 %
PLATELETS: 178 10*3/uL (ref 150–400)
RBC: 4.39 MIL/uL (ref 3.87–5.11)
RDW: 12.7 % (ref 11.5–15.5)
WBC: 7.3 10*3/uL (ref 4.0–10.5)

## 2017-07-09 LAB — BASIC METABOLIC PANEL
ANION GAP: 10 (ref 5–15)
BUN: 10 mg/dL (ref 6–20)
CALCIUM: 9.9 mg/dL (ref 8.9–10.3)
CHLORIDE: 107 mmol/L (ref 101–111)
CO2: 23 mmol/L (ref 22–32)
CREATININE: 0.74 mg/dL (ref 0.44–1.00)
GFR calc Af Amer: >60 mL/min (ref >60)
GFR calc non Af Amer: >60 mL/min (ref >60)
GLUCOSE: 75 mg/dL (ref 65–99)
Potassium: 3.9 mmol/L (ref 3.5–5.1)
Sodium: 140 mmol/L (ref 135–145)

## 2017-07-09 LAB — URINALYSIS, ROUTINE W REFLEX MICROSCOPIC
Bilirubin Urine: NEGATIVE
GLUCOSE, UA: NEGATIVE mg/dL
HGB URINE DIPSTICK: NEGATIVE
KETONES UR: NEGATIVE mg/dL
NITRITE: POSITIVE — AB
Protein, ur: NEGATIVE mg/dL
SPECIFIC GRAVITY, URINE: 1.016 (ref 1.005–1.030)
pH: 9 — ABNORMAL HIGH (ref 5.0–8.0)

## 2017-07-09 LAB — I-STAT CG4 LACTIC ACID, ED
LACTIC ACID, VENOUS: 2.09 mmol/L — AB (ref 0.5–1.9)
LACTIC ACID, VENOUS: 0.79 mmol/L (ref 0.5–1.9)

## 2017-07-09 LAB — POC URINE PREG, ED: Preg Test, Ur: POSITIVE — AB

## 2017-07-09 LAB — I-STAT BETA HCG BLOOD, ED (MC, WL, AP ONLY): I-stat hCG, quantitative: 58.2 m[IU]/mL — ABNORMAL HIGH (ref <5)

## 2017-07-09 MED ORDER — SODIUM CHLORIDE 0.9 % IV SOLN
1.0000 g | Freq: Once | INTRAVENOUS | Status: AC
  Administered 2017-07-09: 1 g via INTRAVENOUS
  Filled 2017-07-09: qty 10

## 2017-07-09 MED ORDER — SODIUM CHLORIDE 0.9 % IV BOLUS
1000.0000 mL | Freq: Once | INTRAVENOUS | Status: AC
  Administered 2017-07-09: 1000 mL via INTRAVENOUS

## 2017-07-09 MED ORDER — OXYCODONE-ACETAMINOPHEN 5-325 MG PO TABS
1.0000 | ORAL_TABLET | Freq: Once | ORAL | Status: AC
  Administered 2017-07-09: 1 via ORAL
  Filled 2017-07-09: qty 1

## 2017-07-09 MED ORDER — ONDANSETRON 4 MG PO TBDP
8.0000 mg | ORAL_TABLET | Freq: Once | ORAL | Status: AC
  Administered 2017-07-09: 8 mg via ORAL
  Filled 2017-07-09: qty 2

## 2017-07-09 MED ORDER — CEPHALEXIN 500 MG PO CAPS: 500.0000 mg | ORAL_CAPSULE | Freq: Four times a day (QID) | ORAL | 0 refills | Status: AC

## 2017-07-09 MED ORDER — ACETAMINOPHEN 325 MG PO TABS
650.0000 mg | ORAL_TABLET | Freq: Once | ORAL | Status: AC
  Administered 2017-07-09: 650 mg via ORAL
  Filled 2017-07-09: qty 2

## 2017-07-09 MED ORDER — HYDROCODONE-ACETAMINOPHEN 5-325 MG PO TABS: 1.0000 | ORAL_TABLET | ORAL | 0 refills | Status: DC | PRN

## 2017-07-09 MED ORDER — METOCLOPRAMIDE HCL 10 MG PO TABS: 10.0000 mg | ORAL_TABLET | Freq: Four times a day (QID) | ORAL | 0 refills | Status: DC

## 2017-07-09 NOTE — ED Notes (Signed)
PA Tatyana notified of elevated potasium.

## 2017-07-09 NOTE — ED Provider Notes (Signed)
[facility] EMERGENCY DEPARTMENT Provider Note   CSN: [id number] Arrival date & time: 07/09/17  1336     History   Chief Complaint Chief Complaint  Patient presents with  . Dysuria    HPI Patty Mata is a 27 y.o. female.  HPI   Patty Mata is a 27 y.o. female, with a history of kidney stones, presenting to the ED with right flank and right lower flank pain beginning last night. Accompanied by chills, nausea, dysuria, and suprapubic pain. Pain is steady throbbing accompanied by intermittent stabbing pain, 8/10.  Patient was seen by myself on April 3 with symptoms of dysuria and suprapubic pain.  Stated her symptoms felt like UTI at that time.  There was no UA evidence of UTI, however, patient was prescribed an antibiotic due to her symptoms.  She did not take this antibiotic stating, "I looked in My Chart and it did not seem like there was evidence of an infection in the urine, so I decided not to take the antibiotic."  LMP June 13, 2017.  Denies fever, vomiting, diarrhea, abnormal vaginal discharge or bleeding, difficulty urinating, or any other complaints.  Past Medical History:  Diagnosis Date  . Anxiety   . Kidney stones     There are no active problems to display for this patient.   Past Surgical History:  Procedure Laterality Date  . STENT PLACEMENT NON-VASCULAR ([facility] HX)     renal     OB History    Gravida  1   Para  1   Term  1   Preterm      AB      Living  1     SAB      TAB      Ectopic      Multiple      Live Births               Home Medications    Prior to Admission medications   Medication Sig Start Date End Date Taking? Authorizing Provider  ALPRAZolam (XANAX) 0.25 MG tablet Take 0.25 mg by mouth as needed.  06/14/17  Yes [provider]  Aspirin-Salicylamide-Caffeine (BC HEADACHE POWDER PO) Take 1 packet by mouth as needed (for headaches).   Yes [provider]  promethazine  (PHENERGAN) 25 MG tablet Take 1 tablet (25 mg total) by mouth every 6 (six) hours as needed for nausea or vomiting. 03/26/17  Yes Hedges, Tinnie Gens, PA-C  cephALEXin (KEFLEX) 500 MG capsule Take 1 capsule (500 mg total) by mouth 4 (four) times daily for 10 days. 07/09/17 07/19/17  Joy, Shawn C, PA-C  HYDROcodone-acetaminophen (NORCO/VICODIN) 5-325 MG tablet Take 1-2 tablets by mouth every 4 (four) hours as needed for severe pain. 07/09/17   Joy, Shawn C, PA-C  metoCLOPramide (REGLAN) 10 MG tablet Take 1 tablet (10 mg total) by mouth every 6 (six) hours. 07/09/17   Joy, Shawn C, PA-C  ondansetron (ZOFRAN ODT) 8 MG disintegrating tablet Take 1 tablet (8 mg total) by mouth 2 (two) times daily. Patient not taking: Reported on 04/14/2017 03/14/17   Currie Paris, NP    Family History Family History  Problem Relation Age of Onset  . Depression Mother   . Arthritis Mother   . Asthma Father   . Mental illness Father     Social History Social History   Tobacco Use  . Smoking status: Current Every Day Smoker    Packs/day: 0.25  Years: 5.00    Pack years: 1.25    Types: Cigarettes  . Smokeless tobacco: Never Used  Substance Use Topics  . Alcohol use: Yes    Comment: Socially; denies today  . 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 Negative for fever.  Respiratory: Negative for shortness of breath.   Cardiovascular: Negative for chest pain.  Gastrointestinal: Positive for abdominal pain and nausea. Negative for blood in stool, constipation, diarrhea and vomiting.  Genitourinary: Positive for dysuria and flank pain. Negative for vaginal bleeding and vaginal discharge.  Musculoskeletal: Positive for back pain.  All other systems reviewed and are negative.    Physical Exam Updated Vital Signs BP 115/86 (BP Location: Right Arm)   Pulse 74    Temp 98.6 F (37 C) (Oral)   Resp 16   Ht 5\' 1"  (1.549 m)   Wt 47.6 kg (105 lb)   LMP 06/13/2017 (Approximate)   SpO2 100%   BMI 19.84 kg/m   Physical Exam  Constitutional: She appears well-developed and well-nourished. No distress.  HENT:  Head: Normocephalic and atraumatic.  Eyes: Conjunctivae are normal.  Neck: Neck supple.  Cardiovascular: Normal rate, regular rhythm, normal heart sounds and intact distal pulses.  Pulmonary/Chest: Effort normal and breath sounds normal. No respiratory distress.  Abdominal: Soft. There is tenderness in the suprapubic area. There is CVA tenderness (right). There is no guarding.  Musculoskeletal: She exhibits no edema.  Lymphadenopathy:    She has no cervical adenopathy.  Neurological: She is alert.  Skin: Skin is warm and dry. She is not diaphoretic.  Psychiatric: She has a normal mood and affect. Her behavior is normal.  Nursing note and vitals reviewed.    ED Treatments / Results  Labs (all labs ordered are listed, but only abnormal results are displayed) Labs Reviewed  URINALYSIS, ROUTINE W REFLEX MICROSCOPIC - Abnormal; Notable for the following components:      Result Value   APPearance HAZY (*)    pH 9.0 (*)    Nitrite POSITIVE (*)    Leukocytes, UA TRACE (*)    Bacteria, UA MANY (*)    Squamous Epithelial / LPF 6-30 (*)    All other components within normal limits  POC URINE PREG, ED - Abnormal; Notable for the following components:   Preg Test, Ur POSITIVE (*)    All other components within normal limits  I-STAT CG4 LACTIC ACID, ED - Abnormal; Notable for the following components:   Lactic Acid, Venous 2.09 (*)    All other components within normal limits  I-STAT BETA HCG BLOOD, ED ([facility], [facility], [facility] ONLY) - Abnormal; Notable for the following components:   I-stat hCG, quantitative 58.2 (*)    All other components within normal limits  URINE CULTURE  CBC WITH DIFFERENTIAL/PLATELET  BASIC METABOLIC PANEL  I-STAT CG4 LACTIC  ACID, ED    EKG None  Radiology US Ob Comp < 14 Wks  Result Date: 07/09/2017 CLINICAL DATA:  Pelvic pain. Positive pregnancy test. Quantitative beta HCG 58.2. Patient 3 weeks, 5 days pregnant based on her last menstrual period. EXAM: OBSTETRIC <14 WK [place] AND TRANSVAGINAL OB US TECHNIQUE: Both transabdominal and transvaginal ultrasound examinations were performed for complete evaluation of the gestation as well as the maternal uterus, adnexal regions, and pelvic cul-de-sac. Transvaginal technique was performed to assess early pregnancy. COMPARISON:  None. FINDINGS: Intrauterine gestational sac: None Yolk sac:  Visualized. Embryo:  Visualized. Cardiac Activity: Not applicable. Maternal uterus/adnexae: Mildly complicated right ovarian cyst measuring 16 mm. Ovaries otherwise unremarkable. No uterine masses. Endometrium measures 6 mm thickness. No endometrial fluid. Cervix is closed. No adnexal masses. No abnormal pelvic free fluid. IMPRESSION: 1. No evidence of an intrauterine or ectopic pregnancy. Findings may simply be due to an early intrauterine pregnancy, but follow-up is recommended to document normal pregnancy progression including serial beta HCG levels and repeat ultrasound in 10-14 days. 2. No acute findings.  No findings to account for pelvic pain. Electronically Signed   By: Amie Portland M.D.   On: 07/09/2017 17:13   US Ob Transvaginal  Result Date: 07/09/2017 CLINICAL DATA:  Pelvic pain. Positive pregnancy test. Quantitative beta HCG 58.2. Patient 3 weeks, 5 days pregnant based on her last menstrual period. EXAM: OBSTETRIC <14 WK [place] AND TRANSVAGINAL OB US TECHNIQUE: Both transabdominal and transvaginal ultrasound examinations were performed for complete evaluation of the gestation as well as the maternal uterus, adnexal regions, and pelvic cul-de-sac. Transvaginal technique was performed to assess early pregnancy. COMPARISON:  None. FINDINGS: Intrauterine gestational sac: None Yolk sac:   Visualized. Embryo:  Visualized. Cardiac Activity: Not applicable. Maternal uterus/adnexae: Mildly complicated right ovarian cyst measuring 16 mm. Ovaries otherwise unremarkable. No uterine masses. Endometrium measures 6 mm thickness. No endometrial fluid. Cervix is closed. No adnexal masses. No abnormal pelvic free fluid. IMPRESSION: 1. No evidence of an intrauterine or ectopic pregnancy. Findings may simply be due to an early intrauterine pregnancy, but follow-up is recommended to document normal pregnancy progression including serial beta HCG levels and repeat ultrasound in 10-14 days. 2. No acute findings.  No findings to account for pelvic pain. Electronically Signed   By: Amie Portland M.D.   On: 07/09/2017 17:13   US Renal  Result Date: 07/09/2017 CLINICAL DATA:  Right flank pain. History of kidney stones treated with lithotripsy. EXAM: RENAL / URINARY TRACT ULTRASOUND COMPLETE COMPARISON:  CT 03/26/2017 FINDINGS: Right Kidney: Length: 8.6 cm. Echogenicity within normal limits. No mass or hydronephrosis visualized. Evidence of stone disease. Left Kidney: Length: 9.6 cm. Echogenicity within normal limits. No mass or hydronephrosis visualized. 2 mm stone in the midportion as seen on previous CT. Nonobstructing. Bladder: Appears normal for degree of bladder distention. IMPRESSION: Normal appearance of the kidneys themselves. 2 mm stone in the midportion of the left kidney, nonobstructing. No evidence of right-sided stone disease or hydronephrosis. Electronically Signed   By: Paulina Fusi M.D.   On: 07/09/2017 17:16    Procedures Procedures (including critical care time)  Medications Ordered in ED Medications  oxyCODONE-acetaminophen (PERCOCET/ROXICET) 5-325 MG per tablet 1 tablet (1 tablet Oral Given 07/09/17 1446)  ondansetron (ZOFRAN-ODT) disintegrating tablet 8 mg (8 mg Oral Given 07/09/17 1445)  sodium chloride 0.9 % bolus 1,000 mL (0 mLs Intravenous Stopped 07/09/17 1934)  cefTRIAXone (ROCEPHIN)  1 g in sodium chloride 0.9 % 100 mL IVPB (0 g Intravenous Stopped 07/09/17 1934)  acetaminophen (TYLENOL) tablet 650 mg (650 mg Oral Given 07/09/17 1941)     Initial Impression / Assessment and Plan / ED Course  I have reviewed the triage vital signs and the nursing notes.  Pertinent labs & imaging results that were available during my care of the patient were reviewed by me and considered in my medical decision making (see chart for details).  Clinical Course as of Jul 09 2028  Wed Jul 09, 2017  1822 Spoke  with Dr. Despina HiddenEure, OBGYN. States patient does not need to be admitted because she has not gone through any of the physiologic changes of pregnancy.  He would recommend 1g of rocephin and then PO Keflex outpatient, even given her listed PCN allergy of rash.    [SJ]  2029 States she feels better. Pain is controlled.    [SJ]    Clinical Course User Index [SJ] Joy, Shawn C, PA-C    Patient presents with continued suprapubic pain, now with constant flank pain.  Bacteria, nitrite, and leukocytes on UA, combined with the patient's symptoms, suggest pyelonephritis.  No stone or evidence of right-sided stone noted on renal ultrasound.  Elevated lactic acid quickly resolved with IV fluids. Patient is nontoxic appearing, afebrile, not tachypneic, not hypotensive, maintains SPO2 of 100% on room air, and is in no apparent distress on my exam.  Tachycardia had also already resolved upon my exam. Patient does have a positive pregnancy test, but hCG is much too low at this time to expect a visual representation of her pregnancy on ultrasound.  She is to have repeat quantitative hCG in 48-72 hours.  The patient was given instructions for home care as well as return precautions. Patient voices understanding of these instructions, accepts the plan, and is comfortable with discharge.   Vitals:   07/09/17 1402 07/09/17 1528 07/09/17 1723 07/09/17 1730  BP: (!) 132/93 130/86 115/86 120/81  Pulse: (!) 117 80  74 73  Resp:  20 16   Temp: 98.6 F (37 C)     TempSrc: Oral     SpO2: 100% 100% 100% 100%  Weight:      Height:         Final Clinical Impressions(s) / ED Diagnoses   Final diagnoses:  Pyelonephritis    ED Discharge Orders        Ordered    metoCLOPramide (REGLAN) 10 MG tablet  Every 6 hours     07/09/17 1833    HYDROcodone-acetaminophen (NORCO/VICODIN) 5-325 MG tablet  Every 4 hours PRN     07/09/17 2023    cephALEXin (KEFLEX) 500 MG capsule  4 times daily     07/09/17 2025       Concepcion LivingJoy, Shawn C, PA-C 07/09/17 2035    Mancel BaleWentz, Elliott, MD 07/10/17 1511

## 2017-07-09 NOTE — ED Notes (Signed)
Pt in US. US tech asked to transport pt back to E38 after imaging.

## 2017-07-09 NOTE — ED Triage Notes (Signed)
Pt reports she was treated for UTI here a couple of days ago but states that she did not take any of the antibiotics because she feels like it is her kidneys. She states she is concerned because she saw on her "chart somewhere possible chronic kidney disease".

## 2017-07-09 NOTE — ED Provider Notes (Addendum)
Patient placed in Quick Look pathway, seen and evaluated   Chief Complaint: flank pain, dysuria  HPI:    Patient in emergency department with a week of right flank pain, dysuria, frequency, urgency, suprapubic pain.  She reports associated nausea and vomiting.  She was seen a week ago and was told she may have a urinary tract infection.  She was given prescription for antibiotics which she did not take because she  checked my chart and did not see where it she was positive for infection.  Patient states she was under the impression that she had a kidney disease because her "my chart" said so.  She reports history of kidney stones and states she has had to have [facility] in the past.  She states that the pain she is feeling in her flank feels the same.  She reports subjective fevers and chills.  She reports sweats.  ROS: positive for dysuria, frequency, urgency, flank pain, suprapubic pain (one)  Physical Exam:   Gen: crying, diaphoretic  Neuro: Awake and Alert  Skin: Warm    Focused Exam: right cva tenderness, suprapubic tenderness  Right flank pain, hx of kidney stones, feels the same. Also diagnosed with UTI, grew 80,000 colonies of ecoli last week, did not take antibiotics.  At this time patient appears to be in a lot of pain, she is diaphoretic, she is tachycardic with heart rate in 120s on the monitor.  Blood pressure and temperature normal at this time.  Will get labs, CT renal, repeat urinalysis.   Initiation of care has begun. The patient has been counseled on the process, plan, and necessity for staying for the completion/evaluation, and the remainder of the medical screening examination    Jaynie CrumbleKirichenko, Lawerence Dery, PA-C 07/09/17 1428   3:08 PM Patient still in the waiting room, I was told she has a positive pregnancy test.  Positive urine pregnancy test as well as hCG of 58.  I will change her CT scan to ultrasound renal and get pelvic ultrasound.  I suspect  that it would be too early given her hCG is 58 to see gestational sac or pregnancy, however will get ultrasound for baseline.   Jaynie CrumbleKirichenko, Daaiel Starlin, PA-C 07/09/17 1509    Jaynie CrumbleKirichenko, Goebel Hellums, PA-C 07/09/17 1830    Derwood KaplanNanavati, Ankit, MD 07/10/17 1538

## 2017-07-09 NOTE — ED Notes (Signed)
Patient verbalizes understanding of discharge instructions. Opportunity for questioning and answers were provided. Armband removed by staff, pt discharged from ED. E signature not working at this time.  

## 2017-07-09 NOTE — Discharge Instructions (Addendum)
°  There is evidence of a kidney infection called pyelonephritis. Please take all of your antibiotics until finished!   You may develop abdominal discomfort or diarrhea from the antibiotic.  You may help offset this with probiotics which you can buy or get in yogurt. Do not eat or take the probiotics until 2 hours after your antibiotic.   Hand washing: Wash your hands throughout the day, but especially before and after touching the face, using the restroom, sneezing, coughing, or touching surfaces that have been coughed or sneezed upon. Hydration: Symptoms will be intensified and complicated by dehydration. Dehydration can also extend the duration of symptoms. Drink plenty of fluids and get plenty of rest. You should be drinking at least half a liter of water an hour to stay hydrated. Electrolyte drinks (ex. Gatorade, Powerade, Pedialyte) are also encouraged. You should be drinking enough fluids to make your urine light yellow, almost clear. If this is not the case, you are not drinking enough water. Please note that some of the treatments indicated below will not be effective if you are not adequately hydrated. Diet: Please concentrate on hydration, however, you may introduce food slowly.  Start with a clear liquid diet, progressed to a full liquid diet, and then bland solids as you are able. Pain: Tylenol for pain. Vicodin for severe pain.  Do not drive or perform other dangerous activities while taking the Vicodin. Nausea/vomiting: Use the Reglan for nausea or vomiting.  Follow up: Please go to the women's clinic in 48-72 hours for repeat quantitative hCG level.  Repeat ultrasound in 10-14 days. Return: Should symptoms worsen, you develop fever over 100.3 F, you have persistent vomiting, or any other major concerns please go directly to the [facility] ED.

## 2017-07-10 LAB — URINE CULTURE: CULTURE: NO GROWTH

## 2017-07-11 ENCOUNTER — Other Ambulatory Visit: Payer: Medicaid Other

## 2017-07-11 DIAGNOSIS — O3680X0 Pregnancy with inconclusive fetal viability, not applicable or unspecified: Secondary | ICD-10-CM

## 2017-07-11 NOTE — Progress Notes (Signed)
Patient came into front office today stating she was told to follow up in 2 days for labs. Patient reports brown spotting that started after ER visit. States pain has greatly improved since ER visit. Reviewed patient history with Vonzella NippleJulie Wenzel who recommends non stat bhcg today & follow up stat bhcg on Monday. Discussed with patient & ectopic precautions given with instructions to return to MAU if needed.  Patient had questions about ultrasound. Discussed with patient that the results portion was not accurate, there wasn't a visualized pregnancy. Discussed normal early pregnancies cannot be seen until 5.5 weeks usually & we will use her LMP for dating unless future ultrasound shows that is inaccurate. Patient verbalized understanding & has no questions.  Patient will return Monday for stat bhcg.

## 2017-07-11 NOTE — Addendum Note (Signed)
Addended by: Donnetta HutchingSTATEN, Asaad Gulley D on: 07/11/2017 12:10 PM   Modules accepted: Orders

## 2017-07-12 LAB — BETA HCG QUANT (REF LAB): hCG Quant: 154 m[IU]/mL

## 2017-07-14 ENCOUNTER — Ambulatory Visit: Payer: Medicaid Other

## 2017-07-14 NOTE — Progress Notes (Signed)
I have reviewed the chart and agree with nursing staff's documentation of this patient's encounter.  Vonzella NippleJulie Savonna Birchmeier, PA-C 07/14/2017 12:00 PM

## 2017-07-16 ENCOUNTER — Telehealth: Payer: Self-pay | Admitting: General Practice

## 2017-07-16 ENCOUNTER — Ambulatory Visit (INDEPENDENT_AMBULATORY_CARE_PROVIDER_SITE_OTHER): Payer: Self-pay | Admitting: General Practice

## 2017-07-16 DIAGNOSIS — O3680X0 Pregnancy with inconclusive fetal viability, not applicable or unspecified: Secondary | ICD-10-CM

## 2017-07-16 DIAGNOSIS — O283 Abnormal ultrasonic finding on antenatal screening of mother: Secondary | ICD-10-CM

## 2017-07-16 LAB — HCG, QUANTITATIVE, PREGNANCY: HCG, BETA CHAIN, QUANT, S: 1104 m[IU]/mL — AB (ref <5)

## 2017-07-16 NOTE — Progress Notes (Signed)
Patient here for stat bhcg today. Patient denies pain or bleeding. Discussed with patient we are monitoring her bhcg levels and asked she wait in lobby for results/updated plan of care. Patient verbalized understanding and states she has to leave by 12pm to pick up daughter for school. Patient had no questions.  Reviewed results with Dr Debroah LoopArnold who finds appropriate rise in bhcg levels, patient should have follow up ultrasound in 2 weeks. Scheduled for 5/1. Will call patient with results.

## 2017-07-16 NOTE — Telephone Encounter (Signed)
Called patient & informed her of bhcg results & scheduled ultrasound appt. Ectopic precautions reviewed. Patient verbalized understanding to all & had no questions

## 2017-07-21 ENCOUNTER — Other Ambulatory Visit: Payer: Self-pay | Admitting: Obstetrics & Gynecology

## 2017-07-28 ENCOUNTER — Other Ambulatory Visit: Payer: Self-pay | Admitting: General Practice

## 2017-07-28 DIAGNOSIS — O3680X0 Pregnancy with inconclusive fetal viability, not applicable or unspecified: Secondary | ICD-10-CM

## 2017-07-30 ENCOUNTER — Ambulatory Visit (HOSPITAL_COMMUNITY)
Admission: RE | Admit: 2017-07-30 | Discharge: 2017-07-30 | Disposition: A | Discharge status: Home / Self Care | Payer: Medicaid Other | Source: Ambulatory Visit | Attending: Obstetrics & Gynecology | Admitting: Obstetrics & Gynecology

## 2017-07-30 DIAGNOSIS — Z3A01 Less than 8 weeks gestation of pregnancy: Secondary | ICD-10-CM | POA: Diagnosis not present

## 2017-07-30 DIAGNOSIS — Z369 Encounter for antenatal screening, unspecified: Secondary | ICD-10-CM | POA: Diagnosis present

## 2017-08-05 ENCOUNTER — Telehealth: Payer: Self-pay | Admitting: Advanced Practice Midwife

## 2017-08-05 DIAGNOSIS — O219 Vomiting of pregnancy, unspecified: Secondary | ICD-10-CM

## 2017-08-05 MED ORDER — PROMETHAZINE HCL 25 MG PO TABS: 25.0000 mg | ORAL_TABLET | Freq: Four times a day (QID) | ORAL | 0 refills | Status: DC | PRN

## 2017-08-05 NOTE — Telephone Encounter (Signed)
Pt canceled today's nurse visit intake new ob stating too nauseated to come in for appt. Explained unable to r/s nurse visit at this time. Pt first new ob appt is 6/13 at [facility] with Thressa Sheller. Pt fam plan mcd at this time pending for pregn mcd. Can we call in nausea meds for pt or r/s nurse intake? Please advise pt.

## 2017-08-05 NOTE — Telephone Encounter (Signed)
Obtained Rx for phenergan for the patient. Called & informed her. Patient verbalized understanding & had no questions.

## 2017-09-11 ENCOUNTER — Encounter: Payer: Medicaid Other | Admitting: Advanced Practice Midwife

## 2017-09-11 ENCOUNTER — Encounter: Payer: Self-pay | Admitting: General Practice

## 2017-10-16 ENCOUNTER — Encounter: Payer: Medicaid Other | Admitting: Obstetrics and Gynecology

## 2017-10-18 ENCOUNTER — Encounter (HOSPITAL_COMMUNITY): Payer: Self-pay | Admitting: *Deleted

## 2017-10-18 ENCOUNTER — Inpatient Hospital Stay (HOSPITAL_COMMUNITY)
Admission: AD | Admit: 2017-10-18 | Discharge: 2017-10-18 | Disposition: A | Discharge status: Home / Self Care | Payer: Medicaid Other | Source: Ambulatory Visit | Attending: Obstetrics and Gynecology | Admitting: Obstetrics and Gynecology

## 2017-10-18 DIAGNOSIS — O99332 Smoking (tobacco) complicating pregnancy, second trimester: Secondary | ICD-10-CM | POA: Insufficient documentation

## 2017-10-18 DIAGNOSIS — O210 Mild hyperemesis gravidarum: Secondary | ICD-10-CM | POA: Insufficient documentation

## 2017-10-18 DIAGNOSIS — Z3A18 18 weeks gestation of pregnancy: Secondary | ICD-10-CM | POA: Insufficient documentation

## 2017-10-18 DIAGNOSIS — Z880 Allergy status to penicillin: Secondary | ICD-10-CM | POA: Insufficient documentation

## 2017-10-18 DIAGNOSIS — F17210 Nicotine dependence, cigarettes, uncomplicated: Secondary | ICD-10-CM | POA: Diagnosis not present

## 2017-10-18 DIAGNOSIS — O219 Vomiting of pregnancy, unspecified: Secondary | ICD-10-CM | POA: Diagnosis not present

## 2017-10-18 MED ORDER — METOCLOPRAMIDE HCL 10 MG PO TABS: 10.0000 mg | ORAL_TABLET | Freq: Three times a day (TID) | ORAL | 0 refills | Status: DC | PRN

## 2017-10-18 NOTE — MAU Note (Signed)
Judeth HornErin Lawrence Np in Triage to see pt. Discussed plan of care and d/c. Written and verbal d/c instructions given and understanding voiced. Pt d/c home from Triage. Has work note as well

## 2017-10-18 NOTE — MAU Note (Signed)
Has had n/v entire pregnancy. 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 Has phenergan but makes sleepy. 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 No pain. Small amt white d/c but nothing new

## 2017-10-18 NOTE — Discharge Instructions (Signed)
Morning Sickness °Morning sickness is when you feel sick to your stomach (nauseous) during pregnancy. This nauseous feeling may or may not come with vomiting. It often occurs in the morning but can be a problem any time of day. Morning sickness is most common during the first trimester, but it may continue throughout pregnancy. While morning sickness is unpleasant, it is usually harmless unless you develop severe and continual vomiting (hyperemesis gravidarum). This condition requires more intense treatment. °What are the causes? °The cause of morning sickness is not completely known but seems to be related to normal hormonal changes that occur in pregnancy. °What increases the risk? °You are at greater risk if you: °· Experienced nausea or vomiting before your pregnancy. °· Had morning sickness during a previous pregnancy. °· Are pregnant with more than one baby, such as twins. ° °How is this treated? °Do not use any medicines (prescription, over-the-counter, or herbal) for morning sickness without first talking to your health care provider. Your health care provider may prescribe or recommend: °· Vitamin B6 supplements. °· Anti-nausea medicines. °· The herbal medicine ginger. ° °Follow these instructions at home: °· Only take over-the-counter or prescription medicines as directed by your health care provider. °· Taking multivitamins before getting pregnant can prevent or decrease the severity of morning sickness in most women. °· Eat a piece of dry toast or unsalted crackers before getting out of bed in the morning. °· Eat five or six small meals a day. °· Eat dry and bland foods (rice, baked potato). Foods high in carbohydrates are often helpful. °· Do not drink liquids with your meals. Drink liquids between meals. °· Avoid greasy, fatty, and spicy foods. °· Get someone to cook for you if the smell of any food causes nausea and vomiting. °· If you feel nauseous after taking prenatal vitamins, take the vitamins at  night or with a snack. °· Snack on protein foods (nuts, yogurt, cheese) between meals if you are hungry. °· Eat unsweetened gelatins for desserts. °· Wearing an acupressure wristband (worn for sea sickness) may be helpful. °· Acupuncture may be helpful. °· Do not smoke. °· Get a humidifier to keep the air in your house free of odors. °· Get plenty of fresh air. °Contact a health care provider if: °· Your home remedies are not working, and you need medicine. °· You feel dizzy or lightheaded. °· You are losing weight. °Get help right away if: °· You have persistent and uncontrolled nausea and vomiting. °· You pass out (faint). °This information is not intended to replace advice given to you by your health care provider. Make sure you discuss any questions you have with your health care provider. °Document Released: 05/09/2006 Document Revised: 08/24/2015 Document Reviewed: 09/02/2012 °Elsevier Interactive Patient Education © 2017 [employer] Inc[employer] ° °

## 2017-10-18 NOTE — MAU Provider Note (Signed)
Chief Complaint: Nausea and Emesis During Pregnancy   First Provider Initiated Contact with Patient 10/18/17 2157     SUBJECTIVE HPI: Patty Mata is a 27 y.o. G2P1001 at 7156w0d who presents to Maternity Admissions reporting nausea. Ran out of her antiemetic of phenergan but states she had trouble taking it d/t side effects.  Has not been vomiting but nausea is worsening due to job. 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    Past Medical History:  Diagnosis Date  . Anxiety   . Kidney stones    OB History  Gravida Para Term Preterm AB Living  2 1 1     1   SAB TAB Ectopic Multiple Live Births               # Outcome Date GA Lbr Len/2nd Weight Sex Delivery Anes PTL Lv  2 Current           1 Term     F       Past Surgical History:  Procedure Laterality Date  . STENT PLACEMENT NON-VASCULAR ([facility] HX)     renal   Social History   Socioeconomic History  . Marital status: Single    Spouse name: Not on file  . Number of children: Not on file  . Years of education: Not on file  . Highest education level: Not on file  Occupational History  . Not on file  Social Needs  . Financial resource strain: Not on file  . Food insecurity:    Worry: Not on file    Inability: Not on file  . Transportation needs:    Medical: Not on file    Non-medical: Not on file  Tobacco Use  . Smoking status: Current Every Day Smoker    Packs/day: 0.25    Years: 5.00    Pack years: 1.25    Types: Cigarettes  . Smokeless tobacco: Never Used  Substance and Sexual Activity  . Alcohol use: Yes    Comment: Socially; denies today  . 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 Sexual activity: Yes    Birth control/protection: None  Lifestyle  . Physical activity:    Days per week: Not on file    Minutes per session: Not on file  . Stress: Not on file  Relationships  . Social connections:    Talks on phone: Not on  file    Gets together: Not on file    Attends religious service: Not on file    Active member of club or organization: Not on file    Attends meetings of clubs or organizations: Not on file    Relationship status: Not on file  . Intimate partner violence:    Fear of current or ex partner: Not on file    Emotionally abused: Not on file    Physically abused: Not on file    Forced sexual activity: Not on file  Other Topics Concern  . Not on file  Social History Narrative  . Not on file   Family History  Problem Relation Age of Onset  . Depression Mother   . Arthritis Mother   . Asthma Father   . Mental illness Father    No current facility-administered medications on file prior to encounter.    No current outpatient medications on file prior to encounter.   Allergies  Allergen Reactions  . Penicillins  Rash    Has patient had a PCN reaction causing immediate rash, facial/tongue/throat swelling, SOB or lightheadedness with hypotension: Yes Has patient had a PCN reaction causing severe rash involving mucus membranes or skin necrosis: No Has patient had a PCN reaction that required hospitalization: No Has patient had a PCN reaction occurring within the last 10 years: No If all of the above answers are "NO", then may proceed with Cephalosporin use.     I have reviewed patient's Past Medical Hx, Surgical Hx, Family Hx, Social Hx, medications and allergies.   Review of Systems  Constitutional: Negative.   Gastrointestinal: Positive for nausea. Negative for abdominal pain.  Genitourinary: Negative.     OBJECTIVE Patient Vitals for the past 24 hrs:  BP Temp Pulse Resp SpO2 Height Weight  10/18/17 2159 - 98.4 F (36.9 C) - - - - -  10/18/17 2143 - - (!) 105 - 99 % - -  10/18/17 2139 126/81 - (!) 109 18 - 5\' 1"  (1.549 m) 113 lb (51.3 kg)   Constitutional: Well-developed, well-nourished female in no acute distress.  Cardiovascular: normal rate Respiratory: normal rate and  effort.  GI: Abd soft, non-tender, gravid appropriate for gestational age. Pos BS x 4 MS: Extremities nontender, no edema, normal ROM Neurologic: Alert and oriented x 4.    MAU COURSE Orders Placed This Encounter  Procedures  . Discharge patient   Meds ordered this encounter  Medications  . metoCLOPramide (REGLAN) 10 MG tablet    Sig: Take 1 tablet (10 mg total) by mouth every 8 (eight) hours as needed for nausea.    Dispense:  30 tablet    Refill:  0    Order Specific Question:   Supervising Provider    Answer:   [facility] [[id number]]    MDM FHT 144 by doppler Pt requesting antiemetic that won't make her sleepy. Discussed reglan. Doesn't want zofran d/t SE of constipation.  Also would like work note for [employer].   ASSESSMENT 1. Nausea and vomiting during pregnancy prior to [redacted] weeks gestation   2. [redacted] weeks gestation of pregnancy     PLAN Discharge home in stable condition.   Allergies as of 10/18/2017      Reactions   Penicillins Rash   Has patient had a PCN reaction causing immediate rash, facial/tongue/throat swelling, SOB or lightheadedness with hypotension: Yes Has patient had a PCN reaction causing severe rash involving mucus membranes or skin necrosis: No Has patient had a PCN reaction that required hospitalization: No Has patient had a PCN reaction occurring within the last 10 years: No If all of the above answers are "NO", then may proceed with Cephalosporin use.      Medication List    STOP taking these medications   ALPRAZolam 0.25 MG tablet Commonly known as:  XANAX   BC HEADACHE POWDER PO   HYDROcodone-acetaminophen 5-325 MG tablet Commonly known as:  NORCO/VICODIN   ondansetron 8 MG disintegrating tablet Commonly known as:  ZOFRAN ODT   promethazine 25 MG tablet Commonly known as:  PHENERGAN     TAKE these medications   metoCLOPramide 10 MG tablet Commonly known as:  REGLAN Take 1 tablet (10 mg total) by mouth every 8 (eight) hours as  needed for nausea. What changed:    when to take this  reasons to take this        Judeth Horn, NP 10/18/2017  11:25 PM

## 2017-10-30 ENCOUNTER — Encounter: Payer: Self-pay | Admitting: General Practice

## 2017-10-30 ENCOUNTER — Encounter: Payer: Self-pay | Admitting: Advanced Practice Midwife

## 2017-10-30 ENCOUNTER — Other Ambulatory Visit (HOSPITAL_COMMUNITY)
Admission: RE | Admit: 2017-10-30 | Discharge: 2017-10-30 | Disposition: A | Discharge status: Home / Self Care | Payer: Medicaid Other | Source: Ambulatory Visit | Attending: Advanced Practice Midwife | Admitting: Advanced Practice Midwife

## 2017-10-30 ENCOUNTER — Ambulatory Visit (HOSPITAL_COMMUNITY)
Admission: RE | Admit: 2017-10-30 | Discharge: 2017-10-30 | Disposition: A | Discharge status: Home / Self Care | Payer: Medicaid Other | Source: Ambulatory Visit | Attending: Advanced Practice Midwife | Admitting: Advanced Practice Midwife

## 2017-10-30 ENCOUNTER — Other Ambulatory Visit: Payer: Self-pay | Admitting: Advanced Practice Midwife

## 2017-10-30 ENCOUNTER — Ambulatory Visit (INDEPENDENT_AMBULATORY_CARE_PROVIDER_SITE_OTHER): Payer: Medicaid Other | Admitting: Advanced Practice Midwife

## 2017-10-30 DIAGNOSIS — Z3480 Encounter for supervision of other normal pregnancy, unspecified trimester: Secondary | ICD-10-CM

## 2017-10-30 DIAGNOSIS — F419 Anxiety disorder, unspecified: Secondary | ICD-10-CM | POA: Diagnosis not present

## 2017-10-30 DIAGNOSIS — Z3A19 19 weeks gestation of pregnancy: Secondary | ICD-10-CM | POA: Insufficient documentation

## 2017-10-30 DIAGNOSIS — O99332 Smoking (tobacco) complicating pregnancy, second trimester: Secondary | ICD-10-CM | POA: Diagnosis not present

## 2017-10-30 DIAGNOSIS — Z3689 Encounter for other specified antenatal screening: Secondary | ICD-10-CM | POA: Diagnosis not present

## 2017-10-30 DIAGNOSIS — F329 Major depressive disorder, single episode, unspecified: Secondary | ICD-10-CM | POA: Insufficient documentation

## 2017-10-30 DIAGNOSIS — Z3482 Encounter for supervision of other normal pregnancy, second trimester: Secondary | ICD-10-CM | POA: Diagnosis not present

## 2017-10-30 DIAGNOSIS — N390 Urinary tract infection, site not specified: Secondary | ICD-10-CM | POA: Insufficient documentation

## 2017-10-30 DIAGNOSIS — Z363 Encounter for antenatal screening for malformations: Secondary | ICD-10-CM | POA: Diagnosis not present

## 2017-10-30 DIAGNOSIS — F32A Depression, unspecified: Secondary | ICD-10-CM | POA: Insufficient documentation

## 2017-10-30 DIAGNOSIS — O99342 Other mental disorders complicating pregnancy, second trimester: Secondary | ICD-10-CM | POA: Diagnosis not present

## 2017-10-30 MED ORDER — SERTRALINE HCL 25 MG PO TABS: 25.0000 mg | ORAL_TABLET | Freq: Every day | ORAL | 1 refills | Status: DC

## 2017-10-30 NOTE — Patient Instructions (Signed)
Safe Medications in Pregnancy   Acne: Benzoyl Peroxide Salicylic Acid  Backache/Headache: Tylenol: 2 regular strength every 4 hours OR              2 Extra strength every 6 hours  Colds/Coughs/Allergies: Benadryl (alcohol free) 25 mg every 6 hours as needed Breath right strips Claritin Cepacol throat lozenges Chloraseptic throat spray Cold-Eeze- up to three times per day Cough drops, alcohol free Flonase (by prescription only) Guaifenesin Mucinex Robitussin DM (plain only, alcohol free) Saline nasal spray/drops Sudafed (pseudoephedrine) & Actifed ** use only after [redacted] weeks gestation and if you do not have high blood pressure Tylenol Vicks Vaporub Zinc lozenges Zyrtec   Constipation: Colace Ducolax suppositories Fleet enema Glycerin suppositories Metamucil Milk of magnesia Miralax Senokot Smooth move tea  Diarrhea: Kaopectate Imodium A-D  *NO pepto Bismol  Hemorrhoids: Anusol Anusol HC Preparation H Tucks  Indigestion: Tums Maalox Mylanta Zantac  Pepcid  Insomnia: Benadryl (alcohol free) 25mg every 6 hours as needed Tylenol PM Unisom, no Gelcaps  Leg Cramps: Tums MagGel  Nausea/Vomiting:  Bonine Dramamine Emetrol Ginger extract Sea bands Meclizine  Nausea medication to take during pregnancy:  Unisom (doxylamine succinate 25 mg tablets) Take one tablet daily at bedtime. If symptoms are not adequately controlled, the dose can be increased to a maximum recommended dose of two tablets daily (1/2 tablet in the morning, 1/2 tablet mid-afternoon and one at bedtime). Vitamin B6 100mg tablets. Take one tablet twice a day (up to 200 mg per day).  Skin Rashes: Aveeno products Benadryl cream or 25mg every 6 hours as needed Calamine Lotion 1% cortisone cream  Yeast infection: Gyne-lotrimin 7 Monistat 7   **If taking multiple medications, please check labels to avoid duplicating the same active ingredients **take medication as directed on  the label ** Do not exceed 4000 mg of tylenol in 24 hours **Do not take medications that contain aspirin or ibuprofen   Childbirth Education Options: [facility] Classes:  Childbirth education classes can help you get ready for a positive parenting experience. You can also meet other expectant parents and get free stuff for your baby. Each class runs for five weeks on the same night and costs $45 for the mother-to-be and her support person. Medicaid covers the cost if you are eligible. Call [phone] to register. [facility] Childbirth Education:  [phone] or [phone] or [email or web address]  Baby & Me Class: Discuss newborn & infant parenting and family adjustment issues with other new mothers in a relaxed environment. Each week brings a new speaker or baby-centered activity. We encourage new mothers to join us every Thursday at 11:00am. Babies birth until crawling. No registration or fee. Daddy Boot Camp: This course offers Dads-to-be the tools and knowledge needed to feel confident on their journey to becoming new fathers. Experienced dads, who have been trained as coaches, teach dads-to-be how to hold, comfort, diaper, swaddle and play with their infant while being able to support the new mom as well. A class for men taught by men. $25/dad Big Brother/Big Sister: Let your children share in the joy of a new brother or sister in this special class designed just for them. Class includes discussion about how families care for babies: swaddling, holding, diapering, safety as well as how they can be helpful in their new role. This class is designed for children ages 2 to 6, but any age is welcome. Please register each child individually. $5/child  Mom Talk: This mom-led group offers support and connection to   mothers as they journey through the adjustments and struggles of that sometimes overwhelming first year after the birth of a child. Tuesdays at 10:00am and  Thursdays at 6:00pm. Babies welcome. No registration or fee. Breastfeeding Support Group: This group is a mother-to-mother support circle where moms have the opportunity to share their breastfeeding experiences. A Lactation Consultant is present for questions and concerns. Meets each Tuesday at 11:00am. No fee or registration. Breastfeeding Your Baby: Learn what to expect in the first days of breastfeeding your newborn.  This class will help you feel more confident with the skills needed to begin your breastfeeding experience. Many new mothers are concerned about breastfeeding after leaving the hospital. This class will also address the most common fears and challenges about breastfeeding during the first few weeks, months and beyond. (call for fee) Comfort Techniques and Tour: This 2 hour interactive class will provide you the opportunity to learn & practice hands-on techniques that can help relieve some of the discomfort of labor and encourage your baby to rotate toward the best position for birth. You and your partner will be able to try a variety of labor positions with birth balls and rebozos as well as practice breathing, relaxation, and visualization techniques. A tour of the [facility] is included with this class. $20 per registrant and support person Childbirth Class- Weekend Option: This class is a Weekend version of our Birth & Baby series. It is designed for parents who have a difficult time fitting several weeks of classes into their schedule. It covers the care of your newborn and the basics of labor and childbirth. It also includes a Maternity Care Center Tour of [facility] and lunch. The class is held two consecutive days: beginning on Friday evening from 6:30 - 8:30 p.m. and the next day, Saturday from 9 a.m. - 4 p.m. (call for fee) Waterbirth Class: Interested in a waterbirth?  This informational class will help you discover whether waterbirth is the right fit  for you. Education about waterbirth itself, supplies you would need and how to assemble your support team is what you can expect from this class. Some obstetrical practices require this class in order to pursue a waterbirth. (Not all obstetrical practices offer waterbirth-check with your healthcare provider.) Register only the expectant mom, but you are encouraged to bring your partner to class! Required if planning waterbirth, no fee. Infant/Child CPR: Parents, grandparents, babysitters, and friends learn Cardio-Pulmonary Resuscitation skills for infants and children. You will also learn how to treat both conscious and unconscious choking in infants and children. This Family & Friends program does not offer certification. Register each participant individually to ensure that enough mannequins are available. (Call for fee) Grandparent Love: Expecting a grandbaby? This class is for you! Learn about the latest infant care and safety recommendations and ways to support your own child as he or she transitions into the parenting role. Taught by Registered Nurses who are childbirth instructors, but most importantly...they are grandmothers too! $10/person. Childbirth Class- Natural Childbirth: This series of 5 weekly classes is for expectant parents who want to learn and practice natural methods of coping with the process of labor and childbirth. Relaxation, breathing, massage, visualization, role of the partner, and helpful positioning are highlighted. Participants learn how to be confident in their body's ability to give birth. This class will empower and help parents make informed decisions about their own care. Includes discussion that will help new parents transition into the immediate postpartum period. Maternity   Care Center Tour of [facility] is included. We suggest taking this class between 25-32 weeks, but it's only a recommendation. $75 per registrant and one support person or $30 Medicaid. Childbirth  Class- 3 week Series: This option of 3 weekly classes helps you and your labor partner prepare for childbirth. Newborn care, labor & birth, cesarean birth, pain management, and comfort techniques are discussed and a Maternity Care Center Tour of [facility] is included. The class meets at the same time, on the same day of the week for 3 consecutive weeks beginning with the starting date you choose. $60 for registrant and one support person.  Marvelous Multiples: Expecting twins, triplets, or more? This class covers the differences in labor, birth, parenting, and breastfeeding issues that face multiples' parents. NICU tour is included. Led by a Certified Childbirth Educator who is the mother of twins. No fee. Caring for Baby: This class is for expectant and adoptive parents who want to learn and practice the most up-to-date newborn care for their babies. Focus is on birth through the first six weeks of life. Topics include feeding, bathing, diapering, crying, umbilical cord care, circumcision care and safe sleep. Parents learn to recognize symptoms of illness and when to call the pediatrician. Register only the mom-to-be and your partner or support person can plan to come with you! $10 per registrant and support person Childbirth Class- online option: This online class offers you the freedom to complete a Birth and Baby series in the comfort of your own home. The flexibility of this option allows you to review sections at your own pace, at times convenient to you and your support people. It includes additional video information, animations, quizzes, and extended activities. Get organized with helpful eClass tools, checklists, and trackers. Once you register online for the class, you will receive an email within a few days to accept the invitation and begin the class when the time is right for you. The content will be available to you for 60 days. $60 for 60 days of online access for you and your support  people.  Local Doulas: Natural Baby Doulas [email or web address] Tel: [phone] [email or web address] [email or web address] [phone] [email or web address] [employer]  (also do waterbirth tub rental) [phone] [email or web address] [email or web address] [phone] [email or web address] [employer]  [phone] [email or web address] [place] [employer] ([employer]ADA) [email or web address] [place] [facility]  [email or web address] Considering Waterbirth? Guide for patients at [facility]  Why consider waterbirth?  . Gentle birth for babies . Less pain medicine used in labor . May allow for passive descent/less pushing . May reduce perineal tears  . More mobility and instinctive maternal position changes . Increased maternal relaxation . Reduced blood pressure in labor  Is waterbirth safe? What are the risks of infection, drowning or other complications?  . Infection: o Very low risk (3.7 % for tub vs 4.8% for bed) o 7 in 8000 waterbirths with documented infection o Poorly cleaned equipment most common cause o Slightly lower group B strep transmission rate  . Drowning o Maternal:  - Very low risk   - Related to seizures or fainting o Newborn:  - Very low risk. No evidence of increased risk of respiratory problems in multiple large studies - Physiological protection from breathing under water - Avoid underwater birth if there are any fetal complications - Once baby's head is out of the water, keep it out.  . Birth complication o Some reports of cord trauma, but risk decreased by   bringing baby to surface gradually o No evidence of increased risk of shoulder dystocia. Mothers can usually change positions faster in water than in a bed, possibly aiding the  maneuvers to free the shoulder.   You must attend a Waterbirth class at [facility]  3rd Wednesday of every month from 7-9pm  Free  Register by calling [phone] or online at [email or web address]  Bring us the certificate from the class to your prenatal appointment  Meet with a midwife at 36 weeks to see if you can still plan a waterbirth and to sign the consent.   Purchase or rent the following supplies:   Water Birth Pool (Birth Pool in a Box or [place] for instance)  (Tubs start ~$125)  Single-use disposable tub liner designed for your brand of tub  New garden hose labeled "lead-free", "suitable for drinking water",  Electric drain pump to remove water (We recommend 792 gallon per hour or greater pump.)   Separate garden hose to remove the dirty water  Fish net  Bathing suit top (optional)  Long-handled mirror (optional)  Places to purchase or rent supplies  [email or web address] for tub purchases and supplies  [email or web address] for tub purchases and supplies  [employer] ([email or web address]) $275 for tub rental/set-up & take down/kit   [place] [employer] (http://www.padanc.org/MeetUs.htm) Information regarding doulas (labor support) who provide pool rentals  Our practice has a Birth Pool in a Box tub at the hospital that you may borrow on a first-come-first-served basis. It is your responsibility to to set up, clean and break down the tub. We cannot guarantee the availability of this tub in advance. You are responsible for bringing all accessories listed above. If you do not have all necessary supplies you cannot have a waterbirth.    Things that would prevent you from having a waterbirth:  Premature, <37wks  Previous cesarean birth  Presence of thick meconium-stained fluid  Multiple gestation (Twins, triplets, etc.)  Uncontrolled diabetes or gestational diabetes requiring medication  Hypertension requiring medication  or diagnosis of pre-eclampsia  Heavy vaginal bleeding  Non-reassuring fetal heart rate  Active infection (MRSA, etc.). Group B Strep is NOT a contraindication for  waterbirth.  If your labor has to be induced and induction method requires continuous  monitoring of the baby's heart rate  Other risks/issues identified by your obstetrical provider  Please remember that birth is unpredictable. Under certain unforeseeable circumstances your provider may advise against giving birth in the tub. These decisions will be made on a case-by-case basis and with the safety of you and your baby as our highest priority.      

## 2017-10-30 NOTE — Progress Notes (Signed)
Subjective:   Patty Mata is a 27 y.o. G2P1001 at [redacted]w[redacted]d by LMP being seen today for her first obstetrical visit.  Her obstetrical history is significant for smoker. Patient does intend to breast feed. Pregnancy history fully reviewed.  Patient reports fatigue. Patient reports "feeling depressed". 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 She reports that she has used zoloft in the past, and it did help with her depression. However she felt like she had decreased appetite with it. She is willing to try it again. She has an history of substance abuse. She reports that she has not used any drugs in 2 years. However, she does have a RX for xanax and she takes that once or sometimes twice per day.   HISTORY: OB History  Gravida Para Term Preterm AB Living  2 1 1  0 0 1  SAB TAB Ectopic Multiple Live Births  0 0 0 0 1    # Outcome Date GA Lbr Len/2nd Weight Sex Delivery Anes PTL Lv  2 Current           1 Term 06/28/10 [redacted]w[redacted]d   F    LIV    Last pap smear was done unsure and was unsure  Past Medical History:  Diagnosis Date  . Anxiety   . Kidney stones    Past Surgical History:  Procedure Laterality Date  . STENT PLACEMENT NON-VASCULAR ([facility] HX)     renal   Family History  Problem Relation Age of Onset  . Depression Mother   . Arthritis Mother   . Asthma Father   . Mental illness Father   . Cancer Maternal Grandfather    Social History   Tobacco Use  . Smoking status: Current Every Day Smoker    Packs/day: 0.25    Years: 5.00    Pack years: 1.25    Types: Cigarettes  . Smokeless tobacco: Never Used  Substance Use Topics  . Alcohol use: Not Currently    Comment: Socially; denies today  . 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 Penicillins Rash    Has patient had a PCN reaction causing immediate rash, facial/tongue/throat swelling, SOB or lightheadedness with hypotension:  Yes Has patient had a PCN reaction causing severe rash involving mucus membranes or skin necrosis: No Has patient had a PCN reaction that required hospitalization: No Has patient had a PCN reaction occurring within the last 10 years: No If all of the above answers are "NO", then may proceed with Cephalosporin use.    Current Outpatient Medications on File Prior to Visit  Medication Sig Dispense Refill  . metoCLOPramide (REGLAN) 10 MG tablet Take 1 tablet (10 mg total) by mouth every 8 (eight) hours as needed for nausea. 30 tablet 0  . Prenatal Vit-Fe Fumarate-FA (PRENATAL MULTIVITAMIN) TABS tablet Take 1 tablet by mouth daily at 12 noon.     No current facility-administered medications on file prior to visit.     Review of Systems Pertinent items noted in HPI and remainder of comprehensive ROS otherwise negative.  Exam   Vitals:   10/30/17 0859  BP: 119/69  Pulse: 98  Weight: 118 lb (53.5 kg)   Fetal Heart Rate (bpm): 154  Uterus:     Pelvic Exam: Perineum: no hemorrhoids, normal perineum   Vulva: normal external genitalia, no lesions   Vagina:  normal mucosa, normal discharge  Cervix: no lesions and normal, pap smear done.    Adnexa: normal adnexa and no mass, fullness, tenderness   Bony Pelvis: average  System: General: well-developed, well-nourished female in no acute distress   Breast:  normal appearance, no masses or tenderness   Skin: normal coloration and turgor, no rashes   Neurologic: oriented, normal, negative, normal mood   Extremities: normal strength, tone, and muscle mass, ROM of all joints is normal   HEENT PERRLA, extraocular movement intact and sclera clear, anicteric   Mouth/Teeth mucous membranes moist, pharynx normal without lesions and dental hygiene good   Neck supple and no masses   Cardiovascular: regular rate and rhythm   Respiratory:  no respiratory distress, normal breath sounds   Abdomen: soft, non-tender; bowel sounds normal; no masses,  no  organomegaly    Assessment:   Pregnancy: G2P1001 Patient Active Problem List   Diagnosis Date Noted  . Supervision of other normal pregnancy, antepartum 10/30/2017  . Tobacco smoking affecting pregnancy in second trimester 10/30/2017  . Frequent UTI 10/30/2017  . Anxiety and depression 10/30/2017     Plan:  1. Supervision of other normal pregnancy, antepartum - Routine care - Culture, OB Urine - Cystic Fibrosis Mutation 97 - Hemoglobinopathy evaluation - Obstetric Panel, Including HIV - SMN1 Copy Number Analysis - Cytology - PAP - HgB A1c - US MFM OB COMP + 14 WK; Future - Genetic Screening - AFP, Serum, Open Spina Bifida  2. History of depression and anxiety - Start zoloft 25mg  QD - Offered visit with Asher MuirJamie, and patient declines at this time  3. History of substance abuse disorder - Currently not using x 2 years - Has been taking xanax from her PCP. Patient advised that we will not fill this rx here  Initial labs drawn. Continue prenatal vitamins. Genetic Screening discussed, AFP and NIPS: requested. Ultrasound discussed; fetal anatomic survey: ordered. Problem list reviewed and updated. [facility] with multiple MDs and other Advanced Practice Providers was explained to patient; also emphasized that residents, students are part of our team. Routine obstetric precautions reviewed. 50% of 45 min visit spent in counseling and coordination of care. Return in about 1 month (around 11/27/2017).

## 2017-10-31 LAB — CYTOLOGY - PAP
Chlamydia: NEGATIVE
Diagnosis: NEGATIVE
Neisseria Gonorrhea: NEGATIVE

## 2017-11-01 LAB — CULTURE, OB URINE

## 2017-11-01 LAB — URINE CULTURE, OB REFLEX

## 2017-11-04 ENCOUNTER — Encounter: Payer: Self-pay | Admitting: Student

## 2017-11-04 DIAGNOSIS — Z2839 Other underimmunization status: Secondary | ICD-10-CM | POA: Insufficient documentation

## 2017-11-04 DIAGNOSIS — O9989 Other specified diseases and conditions complicating pregnancy, childbirth and the puerperium: Secondary | ICD-10-CM

## 2017-11-04 DIAGNOSIS — Z283 Underimmunization status: Secondary | ICD-10-CM | POA: Insufficient documentation

## 2017-11-04 DIAGNOSIS — O09899 Supervision of other high risk pregnancies, unspecified trimester: Secondary | ICD-10-CM | POA: Insufficient documentation

## 2017-11-07 ENCOUNTER — Encounter: Payer: Self-pay | Admitting: General Practice

## 2017-11-11 LAB — OBSTETRIC PANEL, INCLUDING HIV
Antibody Screen: NEGATIVE
BASOS ABS: 0 10*3/uL (ref 0.0–0.2)
Basos: 0 %
EOS (ABSOLUTE): 0.2 10*3/uL (ref 0.0–0.4)
Eos: 3 %
HIV Screen 4th Generation wRfx: NONREACTIVE
Hematocrit: 36.9 % (ref 34.0–46.6)
Hemoglobin: 12.3 g/dL (ref 11.1–15.9)
Hepatitis B Surface Ag: NEGATIVE
IMMATURE GRANULOCYTES: 1 %
Immature Grans (Abs): 0 10*3/uL (ref 0.0–0.1)
Lymphocytes Absolute: 1.5 10*3/uL (ref 0.7–3.1)
Lymphs: 17 %
MCH: 31.2 pg (ref 26.6–33.0)
MCHC: 33.3 g/dL (ref 31.5–35.7)
MCV: 94 fL (ref 79–97)
MONOCYTES: 7 %
Monocytes Absolute: 0.6 10*3/uL (ref 0.1–0.9)
Neutrophils Absolute: 6.3 10*3/uL (ref 1.4–7.0)
Neutrophils: 72 %
PLATELETS: 185 10*3/uL (ref 150–450)
RBC: 3.94 x10E6/uL (ref 3.77–5.28)
RDW: 13.2 % (ref 12.3–15.4)
RPR: NONREACTIVE
Rh Factor: POSITIVE
Rubella Antibodies, IGG: <0.9 index — ABNORMAL LOW (ref >0.99)
WBC: 8.7 10*3/uL (ref 3.4–10.8)

## 2017-11-11 LAB — AFP, SERUM, OPEN SPINA BIFIDA
AFP MoM: 1.5
AFP VALUE AFPOSL: 89.8 ng/mL
GEST. AGE ON COLLECTION DATE: 19.5 wk
MATERNAL AGE AT EDD: 27.4 a
OSBR RISK 1 IN: 2734
TEST RESULTS AFP: NEGATIVE
WEIGHT: 120 [lb_av]

## 2017-11-11 LAB — SMN1 COPY NUMBER ANALYSIS (SMA CARRIER SCREENING)

## 2017-11-11 LAB — CYSTIC FIBROSIS MUTATION 97: GENE DIS ANAL CARRIER INTERP BLD/T-IMP: NOT DETECTED

## 2017-11-11 LAB — HEMOGLOBINOPATHY EVALUATION
HEMOGLOBIN F QUANTITATION: 0 % (ref 0.0–2.0)
HGB A: 97.5 % (ref 96.4–98.8)
HGB C: 0 %
HGB S: 0 %
HGB VARIANT: 0 %
Hemoglobin A2 Quantitation: 2.5 % (ref 1.8–3.2)

## 2017-11-11 LAB — HEMOGLOBIN A1C
ESTIMATED AVERAGE GLUCOSE: 103 mg/dL
Hgb A1c MFr Bld: 5.2 % (ref 4.8–5.6)

## 2017-11-26 ENCOUNTER — Encounter (HOSPITAL_COMMUNITY): Payer: Self-pay | Admitting: *Deleted

## 2017-11-26 ENCOUNTER — Inpatient Hospital Stay (HOSPITAL_COMMUNITY)
Admission: AD | Admit: 2017-11-26 | Discharge: 2017-11-26 | Disposition: A | Discharge status: Home / Self Care | Payer: Medicaid Other | Source: Ambulatory Visit | Attending: Obstetrics and Gynecology | Admitting: Obstetrics and Gynecology

## 2017-11-26 ENCOUNTER — Inpatient Hospital Stay (HOSPITAL_COMMUNITY): Payer: Medicaid Other

## 2017-11-26 ENCOUNTER — Other Ambulatory Visit: Payer: Self-pay

## 2017-11-26 ENCOUNTER — Encounter

## 2017-11-26 DIAGNOSIS — O9989 Other specified diseases and conditions complicating pregnancy, childbirth and the puerperium: Secondary | ICD-10-CM

## 2017-11-26 DIAGNOSIS — N23 Unspecified renal colic: Secondary | ICD-10-CM

## 2017-11-26 DIAGNOSIS — O26892 Other specified pregnancy related conditions, second trimester: Secondary | ICD-10-CM | POA: Insufficient documentation

## 2017-11-26 DIAGNOSIS — O99332 Smoking (tobacco) complicating pregnancy, second trimester: Secondary | ICD-10-CM | POA: Diagnosis not present

## 2017-11-26 DIAGNOSIS — Z3A23 23 weeks gestation of pregnancy: Secondary | ICD-10-CM | POA: Diagnosis not present

## 2017-11-26 LAB — URINALYSIS, ROUTINE W REFLEX MICROSCOPIC
Bilirubin Urine: NEGATIVE
GLUCOSE, UA: NEGATIVE mg/dL
Hgb urine dipstick: NEGATIVE
KETONES UR: NEGATIVE mg/dL
Nitrite: NEGATIVE
PROTEIN: NEGATIVE mg/dL
Specific Gravity, Urine: 1.011 (ref 1.005–1.030)
pH: 7 (ref 5.0–8.0)

## 2017-11-26 LAB — CBC WITH DIFFERENTIAL/PLATELET
BASOS ABS: 0 10*3/uL (ref 0.0–0.1)
BASOS PCT: 0 %
Eosinophils Absolute: 0.2 10*3/uL (ref 0.0–0.7)
Eosinophils Relative: 2 %
HEMATOCRIT: 34.7 % — AB (ref 36.0–46.0)
HEMOGLOBIN: 11.6 g/dL — AB (ref 12.0–15.0)
LYMPHS PCT: 18 %
Lymphs Abs: 1.9 10*3/uL (ref 0.7–4.0)
MCH: 31.6 pg (ref 26.0–34.0)
MCHC: 33.4 g/dL (ref 30.0–36.0)
MCV: 94.6 fL (ref 78.0–100.0)
MONO ABS: 0.5 10*3/uL (ref 0.1–1.0)
Monocytes Relative: 4 %
NEUTROS ABS: 8.5 10*3/uL — AB (ref 1.7–7.7)
NEUTROS PCT: 76 %
Platelets: 162 10*3/uL (ref 150–400)
RBC: 3.67 MIL/uL — ABNORMAL LOW (ref 3.87–5.11)
RDW: 12.8 % (ref 11.5–15.5)
WBC: 11.1 10*3/uL — ABNORMAL HIGH (ref 4.0–10.5)

## 2017-11-26 MED ORDER — KETOROLAC TROMETHAMINE 60 MG/2ML IM SOLN
60.0000 mg | Freq: Once | INTRAMUSCULAR | Status: AC
  Administered 2017-11-26: 60 mg via INTRAMUSCULAR
  Filled 2017-11-26: qty 2

## 2017-11-26 NOTE — MAU Note (Signed)
Pt presents to MAU with complaints of pain in the right lower part of her back. Reports a history of kidney stones and bladder issues. Denies any VB or abnormal discharge

## 2017-11-26 NOTE — MAU Provider Note (Signed)
History     CSN: [id number]  Arrival date and time: 11/26/17 1517   First Provider Initiated Contact with Patient 11/26/17 1628      No chief complaint on file.  HPI    Patty Mata is a 27 y.o. female with a history of kidney stones G1 @ [redacted]w[redacted]d here with right kidney pain. Says the pain started 3 days ago. Nothing makes the pain better. She has not taken anything for the pain. Laying on her back makes the pain worse. She has had kidney pain in the past, however this feels different. The pain is constant. She rates her pain 8/10.   OB History    Gravida  2   Para  1   Term  1   Preterm      AB      Living  1     SAB      TAB      Ectopic      Multiple      Live Births  1           Past Medical History:  Diagnosis Date  . Anxiety   . Kidney stones     Past Surgical History:  Procedure Laterality Date  . STENT PLACEMENT NON-VASCULAR ([facility] HX)     renal    Family History  Problem Relation Age of Onset  . Depression Mother   . Arthritis Mother   . Asthma Father   . Mental illness Father   . Cancer Maternal Grandfather     Social History   Tobacco Use  . Smoking status: Current Every Day Smoker    Packs/day: 0.25    Years: 5.00    Pack years: 1.25    Types: Cigarettes  . Smokeless tobacco: Never Used  Substance Use Topics  . Alcohol use: Not Currently    Comment: Socially; denies today  . 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 Penicillins Rash    Has patient had a PCN reaction causing immediate rash, facial/tongue/throat swelling, SOB or lightheadedness with hypotension: Yes Has patient had a PCN reaction causing severe rash involving mucus membranes or skin necrosis: No Has patient had a PCN reaction that required hospitalization: No Has patient had a PCN reaction occurring within the last 10 years: No If all of the above answers are "NO",  then may proceed with Cephalosporin use.     Medications Prior to Admission  Medication Sig Dispense Refill Last Dose  . ALPRAZolam (XANAX) 0.25 MG tablet Take 0.25 mg by mouth daily as needed for anxiety.   11/25/2017 at Unknown time  . Prenatal Vit-Fe Fumarate-FA (PRENATAL MULTIVITAMIN) TABS tablet Take 1 tablet by mouth daily at 12 noon.   11/26/2017 at Unknown time  . metoCLOPramide (REGLAN) 10 MG tablet Take 1 tablet (10 mg total) by mouth every 8 (eight) hours as needed for nausea. (Patient not taking: Reported on 11/26/2017) 30 tablet 0 Not Taking at Unknown time  . sertraline (ZOLOFT) 25 MG tablet Take 1 tablet (25 mg total) by mouth daily. (Patient not taking: Reported on 11/26/2017) 30 tablet 1 Not Taking at Unknown time   Results for orders placed or performed during the hospital encounter of 11/26/17 (from the past 48 hour(s))  Urinalysis, Routine w reflex microscopic     Status: Abnormal   Collection Time: 11/26/17  4:10 PM  Result Value Ref Range  Color, Urine YELLOW YELLOW   APPearance CLEAR CLEAR   Specific Gravity, Urine 1.011 1.005 - 1.030   pH 7.0 5.0 - 8.0   Glucose, UA NEGATIVE NEGATIVE mg/dL   Hgb urine dipstick NEGATIVE NEGATIVE   Bilirubin Urine NEGATIVE NEGATIVE   Ketones, ur NEGATIVE NEGATIVE mg/dL   Protein, ur NEGATIVE NEGATIVE mg/dL   Nitrite NEGATIVE NEGATIVE   Leukocytes, UA SMALL (A) NEGATIVE   RBC / HPF 0-5 0 - 5 RBC/hpf   WBC, UA 0-5 0 - 5 WBC/hpf   Bacteria, UA RARE (A) NONE SEEN   Squamous Epithelial / LPF 0-5 0 - 5    Comment: Performed at [facility], [address] [place], [place] [address]  CBC with Differential     Status: Abnormal   Collection Time: 11/26/17  4:48 PM  Result Value Ref Range   WBC 11.1 (H) 4.0 - 10.5 K/uL   RBC 3.67 (L) 3.87 - 5.11 MIL/uL   Hemoglobin 11.6 (L) 12.0 - 15.0 g/dL   HCT 60.4 (L) 54.0 - 98.1 %   MCV 94.6 78.0 - 100.0 fL   MCH 31.6 26.0 - 34.0 pg   MCHC 33.4 30.0 - 36.0 g/dL   RDW 19.1 47.8 - 29.5 %    Platelets 162 150 - 400 K/uL   Neutrophils Relative % 76 %   Neutro Abs 8.5 (H) 1.7 - 7.7 K/uL   Lymphocytes Relative 18 %   Lymphs Abs 1.9 0.7 - 4.0 K/uL   Monocytes Relative 4 %   Monocytes Absolute 0.5 0.1 - 1.0 K/uL   Eosinophils Relative 2 %   Eosinophils Absolute 0.2 0.0 - 0.7 K/uL   Basophils Relative 0 %   Basophils Absolute 0.0 0.0 - 0.1 K/uL    Comment: Performed at [facility], [address], [place], [place] [address]   US Renal  Result Date: 11/26/2017 CLINICAL DATA:  Kidney pain, [redacted] weeks pregnant EXAM: RENAL / URINARY TRACT ULTRASOUND COMPLETE COMPARISON:  07/09/2016 FINDINGS: Right Kidney: Length: 10.0 cm. Normal morphology without mass or hydronephrosis. No shadowing calcifications. Left Kidney: Length: 10.4 cm. Normal morphology without mass or hydronephrosis. No definite shadowing calcifications. Bladder: Unable to evaluate, decompressed/empty. IMPRESSION: Normal renal ultrasound. Electronically Signed   By: Ulyses Southward M.D.   On: 11/26/2017 17:43   Review of Systems  Constitutional: Negative for fever.  Genitourinary: Negative for dysuria, hematuria and vaginal bleeding.   Physical Exam   Blood pressure 118/67, pulse 90, temperature 97.8 F (36.6 C), resp. rate 16, height 5\' 2"  (1.575 m), weight 54.4 kg, last menstrual period 06/13/2017.  Physical Exam  Constitutional: She is oriented to person, place, and time. She appears well-developed and well-nourished. No distress.  HENT:  Head: Normocephalic.  Respiratory: Effort normal.  GI: Soft. She exhibits no distension. There is no tenderness. There is no rebound and no CVA tenderness.  Musculoskeletal: Normal range of motion.  Neurological: She is alert and oriented to person, place, and time.  Skin: Skin is warm. She is not diaphoretic.  Psychiatric: Her behavior is normal.   Fetal Tracing: Baseline: 135 bpm Variability: Moderate  Accelerations: 10x10 Decelerations: None Toco: None  MAU Course   Procedures None  MDM  Urine culture sent Patient with kidney stone history, will send for renal US.  Toradol 60 mg given IM   Assessment and Plan   A:  . 1. Kidney pain   2. [redacted] weeks gestation of pregnancy     P:  Patient left AMA  Duane Lopeasch, Gaile Allmon I, NP 11/26/2017 6:44 PM

## 2017-11-27 ENCOUNTER — Encounter: Payer: Medicaid Other | Admitting: Obstetrics and Gynecology

## 2017-11-28 ENCOUNTER — Encounter: Payer: Self-pay | Admitting: General Practice

## 2017-11-28 LAB — CULTURE, OB URINE
Culture: <10000 — AB
Special Requests: NORMAL

## 2017-12-02 ENCOUNTER — Encounter (HOSPITAL_COMMUNITY): Payer: Self-pay

## 2017-12-02 ENCOUNTER — Inpatient Hospital Stay (HOSPITAL_COMMUNITY)
Admission: AD | Admit: 2017-12-02 | Discharge: 2017-12-02 | Disposition: A | Discharge status: Home / Self Care | Payer: Medicaid Other | Source: Ambulatory Visit | Attending: Obstetrics and Gynecology | Admitting: Obstetrics and Gynecology

## 2017-12-02 ENCOUNTER — Other Ambulatory Visit: Payer: Self-pay

## 2017-12-02 DIAGNOSIS — L739 Follicular disorder, unspecified: Secondary | ICD-10-CM

## 2017-12-02 DIAGNOSIS — Z3A24 24 weeks gestation of pregnancy: Secondary | ICD-10-CM | POA: Diagnosis not present

## 2017-12-02 DIAGNOSIS — O99332 Smoking (tobacco) complicating pregnancy, second trimester: Secondary | ICD-10-CM | POA: Insufficient documentation

## 2017-12-02 DIAGNOSIS — O99712 Diseases of the skin and subcutaneous tissue complicating pregnancy, second trimester: Secondary | ICD-10-CM | POA: Insufficient documentation

## 2017-12-02 MED ORDER — CLINDAMYCIN HCL 300 MG PO CAPS: 300.0000 mg | ORAL_CAPSULE | Freq: Three times a day (TID) | ORAL | 0 refills | Status: AC

## 2017-12-02 NOTE — MAU Note (Signed)
Pt stated that this was making her feel sick, when I asked she said she feels light headed when she gets up. Orthostatic BP done, see flowchart

## 2017-12-02 NOTE — MAU Note (Signed)
Bump underneath rt arm, "needs to go".  First noted about 4 days ago, getting bigger, making whole right side hurt.

## 2017-12-02 NOTE — MAU Provider Note (Signed)
History     CSN: [id number]  Arrival date and time: 12/02/17 1339   First Provider Initiated Contact with Patient 12/02/17 1459      Chief Complaint  Patient presents with  . Mass   HPI   [name] is a 27 y.o. female G2P1001 @ [redacted]w[redacted]d here in MAU with complaints of a lump under her right armpit. She first noticed the lump 4 days ago. The lump is tender, firm and red. She has not tried any remedies or medication for the lump. Says she has had this in the past due to shaving. Says this one is the worse she has ever had. No drainage, no odor. No fever.    OB History    Gravida  2   Para  1   Term  1   Preterm      AB      Living  1     SAB      TAB      Ectopic      Multiple      Live Births  1           Past Medical History:  Diagnosis Date  . Anxiety   . Kidney stones     Past Surgical History:  Procedure Laterality Date  . STENT PLACEMENT NON-VASCULAR ([facility] HX)     renal    Family History  Problem Relation Age of Onset  . Depression Mother   . Arthritis Mother   . Asthma Father   . Mental illness Father   . Cancer Maternal Grandfather     Social History   Tobacco Use  . Smoking status: Current Every Day Smoker    Packs/day: 0.25    Years: 5.00    Pack years: 1.25    Types: Cigarettes  . Smokeless tobacco: Never Used  Substance Use Topics  . Alcohol use: Not Currently    Comment: Socially; denies today  . 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 Penicillins Rash    Has patient had a PCN reaction causing immediate rash, facial/tongue/throat swelling, SOB or lightheadedness with hypotension: Yes Has patient had a PCN reaction causing severe rash involving mucus membranes or skin necrosis: No Has patient had a PCN reaction that required hospitalization: No Has patient had a PCN reaction occurring within the last 10 years: No If all of the  above answers are "NO", then may proceed with Cephalosporin use.     Medications Prior to Admission  Medication Sig Dispense Refill Last Dose  . ALPRAZolam (XANAX) 0.25 MG tablet Take 0.25 mg by mouth daily as needed for anxiety.   Past Week at Unknown time  . Prenatal Vit-Fe Fumarate-FA (PRENATAL MULTIVITAMIN) TABS tablet Take 1 tablet by mouth daily at 12 noon.   12/02/2017 at Unknown time  . metoCLOPramide (REGLAN) 10 MG tablet Take 1 tablet (10 mg total) by mouth every 8 (eight) hours as needed for nausea. (Patient not taking: Reported on 11/26/2017) 30 tablet 0 Not Taking at Unknown time  . sertraline (ZOLOFT) 25 MG tablet Take 1 tablet (25 mg total) by mouth daily. (Patient not taking: Reported on 11/26/2017) 30 tablet 1 Not Taking at Unknown time   No results found for this or any previous visit (from the past 48 hour(s)).  Review of Systems  Constitutional: Negative for fever.  Skin:       +  lump under right axilla    Physical Exam   Blood pressure 107/75, pulse (!) 108, temperature 98.2 F (36.8 C), temperature source Oral, resp. rate 16, weight 53.5 kg, last menstrual period 06/13/2017, SpO2 99 %.  Physical Exam  Constitutional: She is oriented to person, place, and time. She appears well-developed and well-nourished. No distress.  HENT:  Head: Normocephalic.  Eyes: Pupils are equal, round, and reactive to light.  Respiratory: Effort normal.  Musculoskeletal: Normal range of motion.  Neurological: She is alert and oriented to person, place, and time.  Skin: Skin is warm. She is not diaphoretic.     1 inch, raised, erythematous follicular abscess noted under right axilla.   Psychiatric: Her behavior is normal.   MAU Course  Procedures  None  MDM  + fetal heart tones via doppler MSE complete, explained to the patient that we do no I & D abscesses on the axilla. Discussed that it would be appropriate to apply warm compresses and antibiotics as initially treatment. If  symptoms do not improve patient would need to go to urgent care or PCP to have I & D.   Assessment and Plan   A:  1. [redacted] weeks gestation of pregnancy   2. Folliculitis of axilla     P:  Discharge home in stable condition Rx: Clindamycin Return to MAU for OB emergencies F/U as needed, go to urgent care if symptoms worsen Warm compresses often.   Venia Carbon I, NP 12/02/2017 8:12 PM

## 2017-12-02 NOTE — Discharge Instructions (Signed)

## 2017-12-10 ENCOUNTER — Telehealth: Payer: Self-pay | Admitting: General Practice

## 2017-12-10 NOTE — Telephone Encounter (Signed)
Appointment reminder mailed

## 2017-12-10 NOTE — Telephone Encounter (Signed)
Patient left message with [facility] wanting to schedule follow up appt.  Appt has been scheduled, but unable to reach patient via phone.  Letter will be mailed to patient.

## 2017-12-25 ENCOUNTER — Encounter: Payer: Medicaid Other | Admitting: Obstetrics and Gynecology

## 2018-01-01 ENCOUNTER — Telehealth: Payer: Self-pay | Admitting: General Practice

## 2018-01-01 NOTE — Telephone Encounter (Signed)
Patient called stating that she was leaking fluid.  RN advised patient to go to MAU to be evaluated.  Patient voiced understanding.

## 2018-01-01 NOTE — Telephone Encounter (Signed)
Pt called stating she was linking fluid. Pt advised to go to MAU.  Clovis Pu, RN

## 2018-01-07 ENCOUNTER — Inpatient Hospital Stay (HOSPITAL_COMMUNITY)
Admission: AD | Admit: 2018-01-07 | Discharge: 2018-01-07 | Disposition: A | Discharge status: Home / Self Care | Payer: Medicaid Other | Source: Ambulatory Visit | Attending: Obstetrics & Gynecology | Admitting: Obstetrics & Gynecology

## 2018-01-07 ENCOUNTER — Encounter (HOSPITAL_COMMUNITY): Payer: Self-pay | Admitting: *Deleted

## 2018-01-07 DIAGNOSIS — Z0371 Encounter for suspected problem with amniotic cavity and membrane ruled out: Secondary | ICD-10-CM | POA: Diagnosis present

## 2018-01-07 DIAGNOSIS — O99333 Smoking (tobacco) complicating pregnancy, third trimester: Secondary | ICD-10-CM | POA: Insufficient documentation

## 2018-01-07 DIAGNOSIS — Z3A29 29 weeks gestation of pregnancy: Secondary | ICD-10-CM

## 2018-01-07 DIAGNOSIS — O99332 Smoking (tobacco) complicating pregnancy, second trimester: Secondary | ICD-10-CM

## 2018-01-07 DIAGNOSIS — F17210 Nicotine dependence, cigarettes, uncomplicated: Secondary | ICD-10-CM | POA: Insufficient documentation

## 2018-01-07 DIAGNOSIS — R109 Unspecified abdominal pain: Secondary | ICD-10-CM

## 2018-01-07 DIAGNOSIS — Z3480 Encounter for supervision of other normal pregnancy, unspecified trimester: Secondary | ICD-10-CM

## 2018-01-07 DIAGNOSIS — Z880 Allergy status to penicillin: Secondary | ICD-10-CM | POA: Diagnosis not present

## 2018-01-07 DIAGNOSIS — O26893 Other specified pregnancy related conditions, third trimester: Secondary | ICD-10-CM | POA: Insufficient documentation

## 2018-01-07 LAB — URINALYSIS, ROUTINE W REFLEX MICROSCOPIC
BILIRUBIN URINE: NEGATIVE
Glucose, UA: NEGATIVE mg/dL
HGB URINE DIPSTICK: NEGATIVE
Ketones, ur: NEGATIVE mg/dL
NITRITE: NEGATIVE
PROTEIN: NEGATIVE mg/dL
Specific Gravity, Urine: 1.016 (ref 1.005–1.030)
pH: 6 (ref 5.0–8.0)

## 2018-01-07 LAB — AMNISURE RUPTURE OF MEMBRANE (ROM) NOT AT ARMC: AMNISURE: NEGATIVE

## 2018-01-07 NOTE — MAU Provider Note (Signed)
History     CSN: [id number]  Arrival date and time: 01/07/18 1707   First Provider Initiated Contact with Patient 01/07/18 1738      Chief Complaint  Patient presents with  . Abdominal Pain  . Back Pain  . Rupture of Membranes  . Vaginal Discharge  . Vaginal Itching   HPI  Ms.  LEISEL PINETTE is a 27 y.o. year old G10P1001 female at [redacted]w[redacted]d weeks gestation who presents to MAU reporting vaginal swelling, heavy vaginal discharge that is watery at times and vulvar itching x 3-4 days. She is concerned that her watery d/c shows that something is wrong. She is here today, because she is "really worried that something is wrong with the baby/pregnancy." She reports possible UC's every 2-5 mins last week but none now. She also complains that she goes to an OB office with "crappy schedule, had to wait 2 months for an appointment." She was a patient of [facility] with her last pregnancy, but can't go there because she has pregnancy Medicaid. She reports good (+) FM. She denies VB.  Past Medical History:  Diagnosis Date  . Anxiety   . Kidney stones     Past Surgical History:  Procedure Laterality Date  . STENT PLACEMENT NON-VASCULAR ([facility] HX)     renal    Family History  Problem Relation Age of Onset  . Depression Mother   . Arthritis Mother   . Asthma Father   . Mental illness Father   . Cancer Maternal Grandfather     Social History   Tobacco Use  . Smoking status: Current Every Day Smoker    Packs/day: 0.25    Years: 5.00    Pack years: 1.25    Types: Cigarettes  . Smokeless tobacco: Never Used  Substance Use Topics  . Alcohol use: Not Currently    Comment: Socially; denies today  . 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 Penicillins Rash    Has patient had a PCN reaction causing immediate rash, facial/tongue/throat swelling, SOB or lightheadedness with hypotension: Yes Has patient had a  PCN reaction causing severe rash involving mucus membranes or skin necrosis: No Has patient had a PCN reaction that required hospitalization: No Has patient had a PCN reaction occurring within the last 10 years: No If all of the above answers are "NO", then may proceed with Cephalosporin use.     Medications Prior to Admission  Medication Sig Dispense Refill Last Dose  . metoCLOPramide (REGLAN) 10 MG tablet Take 1 tablet (10 mg total) by mouth every 8 (eight) hours as needed for nausea. (Patient not taking: Reported on 11/26/2017) 30 tablet 0 Not Taking at Unknown time  . Prenatal Vit-Fe Fumarate-FA (PRENATAL MULTIVITAMIN) TABS tablet Take 1 tablet by mouth daily at 12 noon.   12/02/2017 at Unknown time  . sertraline (ZOLOFT) 25 MG tablet Take 1 tablet (25 mg total) by mouth daily. (Patient not taking: Reported on 11/26/2017) 30 tablet 1 Not Taking at Unknown time    Review of Systems  Constitutional: Negative.   HENT: Negative.   Eyes: Negative.   Respiratory: Negative.   Cardiovascular: Negative.   Gastrointestinal: Negative.   Endocrine: Negative.   Genitourinary: Positive for vaginal discharge ("heavy & watery") and vaginal pain ("feels swollen").  Musculoskeletal: Negative.   Skin: Negative.   Allergic/Immunologic: Negative.   Neurological: Negative.   Hematological: Negative.  Psychiatric/Behavioral: Negative.    Physical Exam   Blood pressure 110/67, pulse 89, temperature 98.2 F (36.8 C), temperature source Oral, resp. rate 19, height 5\' 1"  (1.549 m), weight 57.6 kg, last menstrual period 06/13/2017, SpO2 100 %.  Physical Exam  Nursing note and vitals reviewed. Constitutional: She is oriented to person, place, and time. She appears well-developed and well-nourished.  HENT:  Head: Normocephalic and atraumatic.  Eyes: Pupils are equal, round, and reactive to light.  Neck: Normal range of motion.  Cardiovascular: Normal rate.  Respiratory: Effort normal.  GI: Soft.   Genitourinary:  Genitourinary Comments: Uterus: gravid, S=D, SE: cervix is smooth, pink, no lesions, copious amt of thick, white vaginal d/c -- fern and Amnisure done, closed/long/firm, no CMT or friability, no adnexal tenderness   Musculoskeletal: Normal range of motion.  Neurological: She is alert and oriented to person, place, and time.  Skin: Skin is warm and dry.  Psychiatric: She has a normal mood and affect. Her behavior is normal. Judgment and thought content normal.    MAU Course  Procedures  MDM [facility]UA Fern Slide Amnisure NST - FHR: 135 bpm / moderate variability / accels present / decels absent / TOCO: Occ. UC noted  Results for orders placed or performed during the hospital encounter of 01/07/18 (from the past 24 hour(s))  Urinalysis, Routine w reflex microscopic     Status: Abnormal   Collection Time: 01/07/18  5:25 PM  Result Value Ref Range   Color, Urine YELLOW YELLOW   APPearance HAZY (A) CLEAR   Specific Gravity, Urine 1.016 1.005 - 1.030   pH 6.0 5.0 - 8.0   Glucose, UA NEGATIVE NEGATIVE mg/dL   Hgb urine dipstick NEGATIVE NEGATIVE   Bilirubin Urine NEGATIVE NEGATIVE   Ketones, ur NEGATIVE NEGATIVE mg/dL   Protein, ur NEGATIVE NEGATIVE mg/dL   Nitrite NEGATIVE NEGATIVE   Leukocytes, UA TRACE (A) NEGATIVE   WBC, UA 0-5 0 - 5 WBC/hpf   Bacteria, UA RARE (A) NONE SEEN   Squamous Epithelial / LPF 6-10 0 - 5   Mucus PRESENT    Amorphous Crystal PRESENT   Amnisure rupture of membrane (rom)not at [facility]     Status: None   Collection Time: 01/07/18  6:25 PM  Result Value Ref Range   Amnisure ROM NEGATIVE     Assessment and Plan  No leakage of amniotic fluid into vagina - Plan: Discharge patient - Reassurance given that tests run today are negative and are not c/w PPROM - Information provided on preventing PTB and third trimester - Discharge home - Keep scheduled appt with [facility] on 01/15/18 - Patient verbalized an understanding of the plan of care and  agrees.   Raelyn Mora, MSN, CNM 01/07/2018, 5:38 PM

## 2018-01-07 NOTE — Discharge Instructions (Signed)

## 2018-01-07 NOTE — MAU Note (Signed)
Pt reports abd tightening for several days, vaginal swelling, reports good fetal movement. Heavy watery discharge with vaginal discharge.

## 2018-01-15 ENCOUNTER — Ambulatory Visit (INDEPENDENT_AMBULATORY_CARE_PROVIDER_SITE_OTHER): Payer: Medicaid Other | Admitting: Obstetrics and Gynecology

## 2018-01-15 ENCOUNTER — Other Ambulatory Visit: Payer: Self-pay

## 2018-01-15 ENCOUNTER — Other Ambulatory Visit (HOSPITAL_COMMUNITY)
Admission: RE | Admit: 2018-01-15 | Discharge: 2018-01-15 | Disposition: A | Discharge status: Home / Self Care | Payer: Medicaid Other | Source: Ambulatory Visit | Attending: Obstetrics and Gynecology | Admitting: Obstetrics and Gynecology

## 2018-01-15 VITALS — BP 123/75 | HR 99 | Wt 132.0 lb

## 2018-01-15 DIAGNOSIS — N898 Other specified noninflammatory disorders of vagina: Secondary | ICD-10-CM

## 2018-01-15 DIAGNOSIS — O26893 Other specified pregnancy related conditions, third trimester: Secondary | ICD-10-CM

## 2018-01-15 DIAGNOSIS — Z3483 Encounter for supervision of other normal pregnancy, third trimester: Secondary | ICD-10-CM | POA: Diagnosis present

## 2018-01-15 DIAGNOSIS — Z3480 Encounter for supervision of other normal pregnancy, unspecified trimester: Secondary | ICD-10-CM

## 2018-01-15 DIAGNOSIS — Z3A30 30 weeks gestation of pregnancy: Secondary | ICD-10-CM | POA: Diagnosis not present

## 2018-01-15 DIAGNOSIS — Z9189 Other specified personal risk factors, not elsewhere classified: Secondary | ICD-10-CM

## 2018-01-15 DIAGNOSIS — F418 Other specified anxiety disorders: Secondary | ICD-10-CM

## 2018-01-15 DIAGNOSIS — O26843 Uterine size-date discrepancy, third trimester: Secondary | ICD-10-CM

## 2018-01-15 MED ORDER — TERCONAZOLE 0.4 % VA CREA: 1.0000 | TOPICAL_CREAM | Freq: Every day | VAGINAL | 0 refills | Status: AC

## 2018-01-15 NOTE — Progress Notes (Deleted)
cer

## 2018-01-15 NOTE — Progress Notes (Signed)
   PRENATAL VISIT NOTE  Subjective:  Patty Mata is a 27 y.o. G2P1001 at [redacted]w[redacted]d being seen today for ongoing prenatal care.  She is currently monitored for the following issues for this low-risk pregnancy and has Supervision of other normal pregnancy, antepartum; Tobacco smoking affecting pregnancy in second trimester; Frequent UTI; Anxiety and depression; Rubella non-immune status, antepartum; and No leakage of amniotic fluid into vagina on their problem list.  Patient reports depressed and very tearful. "Not happy about during this pregnancy. Life in general is making me sad; nothing in particular".  Contractions: Irritability. Vag. Bleeding: None.  Movement: Present. Denies leaking of fluid.   The following portions of the patient's history were reviewed and updated as appropriate: allergies, current medications, past family history, past medical history, past social history, past surgical history and problem list. Problem list updated.  Objective:   Vitals:   01/15/18 1100  BP: 123/75  Pulse: 99  Weight: 132 lb (59.9 kg)    Fetal Status: Fetal Heart Rate (bpm): 136 Fundal Height: 27 cm Movement: Present  Presentation: Undeterminable  General:  Alert, oriented and cooperative. Patient is in no acute distress.  Skin: Skin is warm and dry. No rash noted.   Cardiovascular: Normal heart rate noted  Respiratory: Normal respiratory effort, no problems with respiration noted  Abdomen: Soft, gravid, appropriate for gestational age.  Pain/Pressure: Present     Pelvic: Cervical exam performed Wet Prep done Dilation: Closed Effacement (%): Thick Station: Ballotable  Extremities: Normal range of motion.  Edema: Mild pitting, slight indentation  Mental Status: Normal mood and affect. Normal behavior. Normal judgment and thought content.   Assessment and Plan:  Pregnancy: G2P1001 at [redacted]w[redacted]d  1. Supervision of other normal pregnancy, antepartum - Culture, OB Urine - HIV Antibody (routine  testing w rflx) - RPR - CBC  2. Vaginal itching - Rx for Terazol 0.4% cream hs x 5 days - Cervicovaginal ancillary only  3. At risk for postpartum depression - Amb ref to [facility] - Gwyndolyn Saxon, LCSW in to speak with patient prior to leaving visit today; see Andrea's documentation  4. Uterine size date discrepancy pregnancy, third trimester - [place] MFM OB FOLLOW UP; 1 week for growth; S<D  Preterm labor symptoms and general obstetric precautions including but not limited to vaginal bleeding, contractions, leaking of fluid and fetal movement were reviewed in detail with the patient. Please refer to After Visit Summary for other counseling recommendations.  Return in about 2 weeks (around 01/29/2018) for Return OB visit.  Future Appointments  Date Time Provider Department Center  01/16/2018  9:30 AM [facility] CLINICIAN [facility] WOC  01/20/2018  8:30 AM [facility] LAB [facility] None  01/29/2018 11:15 AM [facility] [place] 4 [facility]-US  02/06/2018 10:10 AM Raelyn Mora, CNM [facility] None    Raelyn Mora, CNM

## 2018-01-15 NOTE — Patient Instructions (Signed)
Perinatal Anxiety  When a woman feels excessive tension or worry (anxiety) during pregnancy or during the first 12 months after she gives birth, she has a condition called perinatal anxiety. Anxiety can interfere with work, school, relationships, and other everyday activities. If it is not managed properly, it can also cause problems in the mother and her baby.   If you are pregnant and you have symptoms of an anxiety disorder, it is important to talk with your health care provider.  What are the causes?  The exact cause of this condition is not known. Hormonal changes during and after pregnancy may play a role in causing perinatal anxiety.  What increases the risk?  You are more likely to develop this condition if:   You have a personal or family history of depression, anxiety, or mood disorders.   You experience a stressful life event during pregnancy, such as the death of a loved one.   You have a lot of regular life stress, such as being a single parent.   You have thyroid problems.    What are the signs or symptoms?  Perinatal anxiety can be different for everyone. It may include:   Panic attacks (panic disorder). These are intense episodes of fear or discomfort that may also cause sweating, nausea, shortness of breath, or fear of dying. They usually last 5-15 minutes.   Reliving an upsetting (traumatic) event through distressing thoughts, dreams, or flashbacks (post-traumatic stress disorder, or PTSD).   Excessive worry about multiple problems (generalized anxiety disorder).   Fear and stress about leaving certain people or loved ones (separation anxiety).   Performing repetitive tasks (compulsions) to relieve stress or worry (obsessive compulsive disorder, or OCD).   Fear of certain objects or situations (phobias).   Excessive worrying, such as a constant feeling that something bad is going to happen.   Inability to relax.   Difficulty concentrating.   Sleep problems.   Frequent nightmares or  disturbing thoughts.    How is this diagnosed?  This condition is diagnosed based on a physical exam and mental evaluation. In some cases, your health care provider may use an anxiety screening tool. These tools include a list of questions that can help a health care provider diagnose anxiety. Your health care provider may refer you to a mental health expert who specializes in anxiety.  How is this treated?  This condition may be treated with:   Medicines. Your health care provider will only give you medicines that have been proven safe for pregnancy and breastfeeding.   Talk therapy with a mental health professional to help change your patterns of thinking (cognitive behavioral therapy).   Mindfulness-based stress reduction.   Other relaxation therapies, such as deep breathing or guided muscle relaxation.   Support groups.    Follow these instructions at home:  Lifestyle   Do not use any products that contain nicotine or tobacco, such as cigarettes and e-cigarettes. If you need help quitting, ask your health care provider.   Do not use alcohol when you are pregnant. After your baby is born, limit alcohol intake to no more than 1 drink a day. One drink equals 12 oz of beer, 5 oz of wine, or 1 oz of hard liquor.   Consider joining a support group for new mothers. Ask your health care provider for recommendations.   Take good care of yourself. Make sure you:  ? Get plenty of sleep. If you are having trouble sleeping, talk with your health   care provider.  ? Eat a healthy diet. This includes plenty of fruits and vegetables, whole grains, and lean proteins.  ? Exercise regularly, as told by your health care provider. Ask your health care provider what exercises are safe for you.  General instructions   Take over-the-counter and prescription medicines only as told by your health care provider.   Talk with your partner or family members about your feelings during pregnancy. Share any concerns or fears that you  may have.   Ask for help with tasks or chores when you need it. Ask friends and family members to provide meals, watch your children, or help with cleaning.   Keep all follow-up visits as told by your health care provider. This is important.  Contact a health care provider if:   You (or people close to you) notice that you have any symptoms of anxiety or depression.   You have anxiety and your symptoms get worse.   You experience side effects from medicines, such as nausea or sleep problems.  Get help right away if:   You feel like hurting yourself, your baby, or someone else.  If you ever feel like you may hurt yourself or others, or have thoughts about taking your own life, get help right away. You can go to your nearest emergency department or call:   Your local emergency services (911 in the [place]   A suicide crisis helpline, such as the National Suicide Prevention Lifeline at 1-800-273-8255. This is open 24 hours a day.    Summary   Perinatal anxiety is when a woman feels excessive tension or worry during pregnancy or during the first 12 months after she gives birth.   Perinatal anxiety may include panic attacks, post-traumatic stress disorder, separation anxiety, phobias, or generalized anxiety.   Perinatal anxiety can cause physical health problems in the mother and baby if not properly managed.   This condition is treated with medicines, talk therapy, stress reduction therapies, or a combination of two or more treatments.   Talk with your partner or family members about your concerns or fears. Do not be afraid to ask for help.  This information is not intended to replace advice given to you by your health care provider. Make sure you discuss any questions you have with your health care provider.  Document Released: 05/15/2016 Document Revised: 05/15/2016 Document Reviewed: 05/15/2016  Elsevier Interactive Patient Education  2018 Elsevier Inc.

## 2018-01-16 ENCOUNTER — Institutional Professional Consult (permissible substitution): Payer: Medicaid Other

## 2018-01-16 LAB — CERVICOVAGINAL ANCILLARY ONLY
BACTERIAL VAGINITIS: NEGATIVE
Candida vaginitis: POSITIVE — AB
Chlamydia: NEGATIVE
NEISSERIA GONORRHEA: NEGATIVE
Trichomonas: NEGATIVE

## 2018-01-16 LAB — RPR: RPR: NONREACTIVE

## 2018-01-16 LAB — HIV ANTIBODY (ROUTINE TESTING W REFLEX): HIV SCREEN 4TH GENERATION: NONREACTIVE

## 2018-01-16 NOTE — BH Specialist Note (Deleted)
Integrated Behavioral Health Initial Visit  MRN: [id number] Name: [name]  Number of Integrated Behavioral Health Clinician visits:: 1/6 Session Start time: ***  Session End time: *** Total time: {IBH Total Time:21014050}  Type of Service: Integrated Behavioral Health- Individual/Family Interpretor:No. Interpretor Name and Language: n/a   Warm Hand Off Completed.       SUBJECTIVE: Patty Mata is a 27 y.o. female accompanied by {CHL AMB ACCOMPANIED WU:9811914782} Patient was referred by *** for anxiety. Patient reports the following symptoms/concerns: *** Duration of problem: ***; Severity of problem: severe  OBJECTIVE: Mood: {BHH MOOD:22306} and Affect: {BHH AFFECT:22307} Risk of harm to self or others: {CHL AMB BH Suicide Current Mental Status:21022748}  LIFE CONTEXT: Family and Social: *** School/Work: *** Self-Care: *** Life Changes: Current pregnancy ***  GOALS ADDRESSED: Patient will: 1. Reduce symptoms of: {IBH Symptoms:21014056} 2. Increase knowledge and/or ability of: {IBH Patient Tools:21014057}  3. Demonstrate ability to: {IBH Goals:21014053}  INTERVENTIONS: Interventions utilized: {IBH Interventions:21014054}  Standardized Assessments completed: {IBH Screening Tools:21014051}  ASSESSMENT: Patient currently experiencing ***.   Patient may benefit from psychoeducation and brief therapeutic interventions regarding coping with symptoms of *** .  PLAN: 1. Follow up with behavioral health clinician on : *** 2. Behavioral recommendations:  -*** -*** -Read educational materials regarding coping with symptoms of ***  3. Referral(s): {IBH Referrals:21014055} 4. "From scale of 1-10, how likely are you to follow plan?": ***  Valetta Close Cintia Gleed, LCSW  Depression screen [facility] 2/9 01/15/2018  Decreased Interest 3  Down, Depressed, Hopeless 3  PHQ - 2 Score 6  Altered sleeping 3  Tired, decreased energy 3  Change in appetite 2  Feeling bad or failure  about yourself  3  Trouble concentrating 2  Moving slowly or fidgety/restless 1  Suicidal thoughts 0  PHQ-9 Score 20   GAD 7 : Generalized Anxiety Score 01/15/2018  Nervous, Anxious, on Edge 3  Control/stop worrying 3  Worry too much - different things 3  Trouble relaxing 3  Restless 3  Easily annoyed or irritable 3  Afraid - awful might happen 1  Total GAD 7 Score 19  Anxiety Difficulty Somewhat difficult

## 2018-01-17 LAB — URINE CULTURE, OB REFLEX: Organism ID, Bacteria: NO GROWTH

## 2018-01-17 LAB — CULTURE, OB URINE

## 2018-01-20 ENCOUNTER — Other Ambulatory Visit: Payer: Medicaid Other | Admitting: *Deleted

## 2018-01-20 DIAGNOSIS — Z3480 Encounter for supervision of other normal pregnancy, unspecified trimester: Secondary | ICD-10-CM

## 2018-01-20 NOTE — Progress Notes (Signed)
    Patient in clinic for glucose testing and CBC.  Clovis Pu, RN

## 2018-01-21 LAB — CBC
Hematocrit: 33.9 % — ABNORMAL LOW (ref 34.0–46.6)
Hemoglobin: 11.2 g/dL (ref 11.1–15.9)
MCH: 31.8 pg (ref 26.6–33.0)
MCHC: 33 g/dL (ref 31.5–35.7)
MCV: 96 fL (ref 79–97)
PLATELETS: 172 10*3/uL (ref 150–450)
RBC: 3.52 x10E6/uL — ABNORMAL LOW (ref 3.77–5.28)
RDW: 12 % — AB (ref 12.3–15.4)
WBC: 12.2 10*3/uL — AB (ref 3.4–10.8)

## 2018-01-21 LAB — GLUCOSE TOLERANCE, 2 HOURS W/ 1HR
GLUCOSE, 1 HOUR: 101 mg/dL (ref 65–179)
GLUCOSE, 2 HOUR: 75 mg/dL (ref 65–152)
GLUCOSE, FASTING: 72 mg/dL (ref 65–91)

## 2018-01-29 ENCOUNTER — Ambulatory Visit (HOSPITAL_COMMUNITY)
Admission: RE | Admit: 2018-01-29 | Discharge: 2018-01-29 | Disposition: A | Discharge status: Home / Self Care | Payer: Medicaid Other | Source: Ambulatory Visit | Attending: Obstetrics and Gynecology | Admitting: Obstetrics and Gynecology

## 2018-01-29 DIAGNOSIS — O26843 Uterine size-date discrepancy, third trimester: Secondary | ICD-10-CM | POA: Diagnosis not present

## 2018-01-29 DIAGNOSIS — Z3A32 32 weeks gestation of pregnancy: Secondary | ICD-10-CM | POA: Insufficient documentation

## 2018-02-03 ENCOUNTER — Other Ambulatory Visit: Payer: Self-pay

## 2018-02-03 ENCOUNTER — Encounter: Payer: Self-pay | Admitting: Obstetrics & Gynecology

## 2018-02-03 ENCOUNTER — Ambulatory Visit (INDEPENDENT_AMBULATORY_CARE_PROVIDER_SITE_OTHER): Payer: Medicaid Other | Admitting: Obstetrics & Gynecology

## 2018-02-03 VITALS — BP 115/76 | HR 109 | Wt 133.0 lb

## 2018-02-03 DIAGNOSIS — F419 Anxiety disorder, unspecified: Secondary | ICD-10-CM

## 2018-02-03 DIAGNOSIS — Z23 Encounter for immunization: Secondary | ICD-10-CM | POA: Diagnosis not present

## 2018-02-03 DIAGNOSIS — N390 Urinary tract infection, site not specified: Secondary | ICD-10-CM

## 2018-02-03 DIAGNOSIS — O9989 Other specified diseases and conditions complicating pregnancy, childbirth and the puerperium: Secondary | ICD-10-CM

## 2018-02-03 DIAGNOSIS — Z283 Underimmunization status: Secondary | ICD-10-CM

## 2018-02-03 DIAGNOSIS — F329 Major depressive disorder, single episode, unspecified: Secondary | ICD-10-CM

## 2018-02-03 DIAGNOSIS — O99332 Smoking (tobacco) complicating pregnancy, second trimester: Secondary | ICD-10-CM

## 2018-02-03 DIAGNOSIS — Z3480 Encounter for supervision of other normal pregnancy, unspecified trimester: Secondary | ICD-10-CM

## 2018-02-03 DIAGNOSIS — O09899 Supervision of other high risk pregnancies, unspecified trimester: Secondary | ICD-10-CM

## 2018-02-03 DIAGNOSIS — F32A Depression, unspecified: Secondary | ICD-10-CM

## 2018-02-03 NOTE — Progress Notes (Signed)
   PRENATAL VISIT NOTE  Subjective:  Patty Mata is a 27 y.o. G2P1001 at [redacted]w[redacted]d being seen today for ongoing prenatal care.  She is currently monitored for the following issues for this high-risk pregnancy and has Supervision of other normal pregnancy, antepartum; Tobacco smoking affecting pregnancy in second trimester; Frequent UTI; Anxiety and depression; Rubella non-immune status, antepartum; and No leakage of amniotic fluid into vagina on their problem list.  Patient reports scratching senstaiton in the vagina . Denies leaking of fluid.   The following portions of the patient's history were reviewed and updated as appropriate: allergies, current medications, past family history, past medical history, past social history, past surgical history and problem list. Problem list updated.  Objective:   Fetal Status:  General:  Alert, oriented and cooperative. Patient is in no acute distress.  Skin: Skin is warm and dry. No rash noted.   Cardiovascular: Normal heart rate noted  Respiratory: Normal respiratory effort, no problems with respiration noted  Abdomen: Soft, gravid, appropriate for gestational age.        Pelvic: Cervical exam deferred        Extremities: Normal range of motion.     Mental Status: Normal mood and affect. Normal behavior. Normal judgment and thought content.   Assessment and Plan:  Pregnancy: G2P1001 at [redacted]w[redacted]d  1. Supervision of other normal pregnancy, antepartum Pt reports no car seat. Has many needs and wants to meet with SW   2. Tobacco smoking affecting pregnancy in second trimester "Quit 5 days ago"   3. Rubella non-immune status, antepartum needs vac PP  4. Frequent UTI  5. Anxiety and depression - declined  Preterm labor symptoms and general obstetric precautions including but not limited to vaginal bleeding, contractions, leaking of fluid and fetal movement were reviewed in detail with the patient. Please refer to After Visit Summary for other  counseling recommendations.  Return in about 2 weeks (around 02/17/2018).  No future appointments.  Willodean Rosenthal, MD

## 2018-02-03 NOTE — Patient Instructions (Signed)

## 2018-02-04 ENCOUNTER — Encounter: Payer: Self-pay | Admitting: General Practice

## 2018-02-05 ENCOUNTER — Telehealth: Payer: Self-pay | Admitting: Obstetrics and Gynecology

## 2018-02-05 NOTE — Telephone Encounter (Signed)
TC from patient reporting increased pelvic pressure and a "scratching feeling" from inside uterus. She has had complaints of both sx's before with no abnormal findings. She reports that she is just "worried that she might be dilated or not."  Advised there are not any available appointments for work-ins on today's schedule and to go to MAU for preterm labor evaluation.    Patient states she has to work today and will need to contact her job about going to the hospital. I advised patient that she will not need an appointment to be seen in MAU in order to be seen.  Patient verbalized an understanding of the plan of care and agrees.    Raelyn Mora, CNM  02/05/2018 10:25 AM

## 2018-02-06 ENCOUNTER — Encounter: Payer: Medicaid Other | Admitting: Obstetrics and Gynecology

## 2018-02-19 ENCOUNTER — Other Ambulatory Visit: Payer: Self-pay

## 2018-02-19 ENCOUNTER — Other Ambulatory Visit (HOSPITAL_COMMUNITY)
Admission: RE | Admit: 2018-02-19 | Discharge: 2018-02-19 | Disposition: A | Discharge status: Home / Self Care | Payer: Medicaid Other | Source: Ambulatory Visit | Attending: Obstetrics and Gynecology | Admitting: Obstetrics and Gynecology

## 2018-02-19 ENCOUNTER — Inpatient Hospital Stay (HOSPITAL_COMMUNITY)
Admission: AD | Admit: 2018-02-19 | Discharge: 2018-02-19 | Disposition: A | Discharge status: Home / Self Care | Payer: Medicaid Other | Source: Ambulatory Visit | Attending: Obstetrics & Gynecology | Admitting: Obstetrics & Gynecology

## 2018-02-19 ENCOUNTER — Ambulatory Visit (INDEPENDENT_AMBULATORY_CARE_PROVIDER_SITE_OTHER): Payer: Medicaid Other | Admitting: Obstetrics and Gynecology

## 2018-02-19 ENCOUNTER — Inpatient Hospital Stay (HOSPITAL_COMMUNITY): Payer: Medicaid Other

## 2018-02-19 ENCOUNTER — Encounter (HOSPITAL_COMMUNITY): Payer: Self-pay

## 2018-02-19 VITALS — BP 112/77 | HR 105 | Temp 97.6°F | Wt 133.8 lb

## 2018-02-19 DIAGNOSIS — F17210 Nicotine dependence, cigarettes, uncomplicated: Secondary | ICD-10-CM | POA: Insufficient documentation

## 2018-02-19 DIAGNOSIS — O26833 Pregnancy related renal disease, third trimester: Secondary | ICD-10-CM | POA: Insufficient documentation

## 2018-02-19 DIAGNOSIS — Z3480 Encounter for supervision of other normal pregnancy, unspecified trimester: Secondary | ICD-10-CM | POA: Diagnosis present

## 2018-02-19 DIAGNOSIS — R10A1 Flank pain, right side: Secondary | ICD-10-CM

## 2018-02-19 DIAGNOSIS — R945 Abnormal results of liver function studies: Secondary | ICD-10-CM

## 2018-02-19 DIAGNOSIS — R7989 Other specified abnormal findings of blood chemistry: Secondary | ICD-10-CM | POA: Insufficient documentation

## 2018-02-19 DIAGNOSIS — O99333 Smoking (tobacco) complicating pregnancy, third trimester: Secondary | ICD-10-CM | POA: Diagnosis not present

## 2018-02-19 DIAGNOSIS — Z0371 Encounter for suspected problem with amniotic cavity and membrane ruled out: Secondary | ICD-10-CM

## 2018-02-19 DIAGNOSIS — Z880 Allergy status to penicillin: Secondary | ICD-10-CM | POA: Insufficient documentation

## 2018-02-19 DIAGNOSIS — R300 Dysuria: Secondary | ICD-10-CM | POA: Diagnosis not present

## 2018-02-19 DIAGNOSIS — R748 Abnormal levels of other serum enzymes: Secondary | ICD-10-CM

## 2018-02-19 DIAGNOSIS — O26893 Other specified pregnancy related conditions, third trimester: Secondary | ICD-10-CM

## 2018-02-19 DIAGNOSIS — Z3A35 35 weeks gestation of pregnancy: Secondary | ICD-10-CM

## 2018-02-19 DIAGNOSIS — N200 Calculus of kidney: Secondary | ICD-10-CM

## 2018-02-19 DIAGNOSIS — R109 Unspecified abdominal pain: Secondary | ICD-10-CM | POA: Diagnosis not present

## 2018-02-19 DIAGNOSIS — Z23 Encounter for immunization: Secondary | ICD-10-CM | POA: Diagnosis not present

## 2018-02-19 DIAGNOSIS — Z3689 Encounter for other specified antenatal screening: Secondary | ICD-10-CM

## 2018-02-19 DIAGNOSIS — O99332 Smoking (tobacco) complicating pregnancy, second trimester: Secondary | ICD-10-CM

## 2018-02-19 DIAGNOSIS — Z3483 Encounter for supervision of other normal pregnancy, third trimester: Secondary | ICD-10-CM

## 2018-02-19 DIAGNOSIS — O26899 Other specified pregnancy related conditions, unspecified trimester: Secondary | ICD-10-CM

## 2018-02-19 DIAGNOSIS — M549 Dorsalgia, unspecified: Secondary | ICD-10-CM | POA: Diagnosis present

## 2018-02-19 LAB — URINALYSIS, ROUTINE W REFLEX MICROSCOPIC
Bilirubin Urine: NEGATIVE
GLUCOSE, UA: NEGATIVE mg/dL
Ketones, ur: 5 mg/dL — AB
NITRITE: NEGATIVE
Protein, ur: 30 mg/dL — AB
RBC / HPF: >50 RBC/hpf — ABNORMAL HIGH (ref 0–5)
Specific Gravity, Urine: 1.023 (ref 1.005–1.030)
pH: 6 (ref 5.0–8.0)

## 2018-02-19 LAB — COMPREHENSIVE METABOLIC PANEL
ALT: 65 U/L — ABNORMAL HIGH (ref 0–44)
ANION GAP: 8 (ref 5–15)
AST: 45 U/L — AB (ref 15–41)
Albumin: 2.8 g/dL — ABNORMAL LOW (ref 3.5–5.0)
Alkaline Phosphatase: 143 U/L — ABNORMAL HIGH (ref 38–126)
BUN: 11 mg/dL (ref 6–20)
CHLORIDE: 105 mmol/L (ref 98–111)
CO2: 21 mmol/L — ABNORMAL LOW (ref 22–32)
Calcium: 8.6 mg/dL — ABNORMAL LOW (ref 8.9–10.3)
Creatinine, Ser: 0.48 mg/dL (ref 0.44–1.00)
Glucose, Bld: 77 mg/dL (ref 70–99)
Potassium: 3.9 mmol/L (ref 3.5–5.1)
Sodium: 134 mmol/L — ABNORMAL LOW (ref 135–145)
Total Bilirubin: 0.5 mg/dL (ref 0.3–1.2)
Total Protein: 6.4 g/dL — ABNORMAL LOW (ref 6.5–8.1)

## 2018-02-19 LAB — CBC
HCT: 32.7 % — ABNORMAL LOW (ref 36.0–46.0)
Hemoglobin: 10.7 g/dL — ABNORMAL LOW (ref 12.0–15.0)
MCH: 31 pg (ref 26.0–34.0)
MCHC: 32.7 g/dL (ref 30.0–36.0)
MCV: 94.8 fL (ref 80.0–100.0)
NRBC: 0 % (ref 0.0–0.2)
PLATELETS: 155 10*3/uL (ref 150–400)
RBC: 3.45 MIL/uL — ABNORMAL LOW (ref 3.87–5.11)
RDW: 12.6 % (ref 11.5–15.5)
WBC: 9.4 10*3/uL (ref 4.0–10.5)

## 2018-02-19 LAB — POCT URINALYSIS DIPSTICK OB
GLUCOSE, UA: NEGATIVE
NITRITE UA: NEGATIVE
PH UA: 6.5 (ref 5.0–8.0)
Spec Grav, UA: 1.025 (ref 1.010–1.025)
Urobilinogen, UA: 1 E.U./dL

## 2018-02-19 LAB — LIPASE, BLOOD: LIPASE: 27 U/L (ref 11–51)

## 2018-02-19 MED ORDER — LACTATED RINGERS IV SOLN
INTRAVENOUS | Status: DC
  Administered 2018-02-19: 17:00:00 via INTRAVENOUS

## 2018-02-19 MED ORDER — HYDROMORPHONE HCL 1 MG/ML IJ SOLN
1.0000 mg | Freq: Once | INTRAMUSCULAR | Status: AC
  Administered 2018-02-19: 1 mg via INTRAVENOUS
  Filled 2018-02-19 (×2): qty 1

## 2018-02-19 MED ORDER — HYDROMORPHONE HCL 1 MG/ML IJ SOLN
1.0000 mg | Freq: Once | INTRAMUSCULAR | Status: AC
  Administered 2018-02-19: 1 mg via INTRAVENOUS
  Filled 2018-02-19: qty 1

## 2018-02-19 MED ORDER — HYDROCODONE-ACETAMINOPHEN 5-325 MG PO TABS: 1.0000 | ORAL_TABLET | ORAL | 0 refills | Status: DC | PRN

## 2018-02-19 MED ORDER — TAMSULOSIN HCL 0.4 MG PO CAPS: 0.4000 mg | ORAL_CAPSULE | Freq: Every day | ORAL | 0 refills | Status: DC

## 2018-02-19 MED ORDER — TAMSULOSIN HCL 0.4 MG PO CAPS
0.4000 mg | ORAL_CAPSULE | Freq: Every day | ORAL | Status: DC
  Filled 2018-02-19 (×2): qty 1

## 2018-02-19 MED ORDER — LACTATED RINGERS IV BOLUS
1000.0000 mL | Freq: Once | INTRAVENOUS | Status: AC
  Administered 2018-02-19: 1000 mL via INTRAVENOUS

## 2018-02-19 NOTE — Progress Notes (Signed)
Subjective: Patty LewandowskyKeely M Mata is a G2P1001 at 6739w5d who [facility]  She does have a history of any mental health concerns. She is not currently sexually active. She is currently using no method for birth control. Patient states father of baby as her support system.   BP 112/77   Pulse (!) 105   Temp 97.6 F (36.4 C)   Wt 133 lb 12.8 oz (60.7 kg)   LMP 06/13/2017 (Approximate)   BMI 25.28 kg/m   Birth Control History:  Condoms and OCP  MDM Patient counseled on all options for birth control today including LARC. Patient desires additional family planning couseling initiated for birth control.   Assessment:  27 y.o. female considering additional family planning counseling for birth control  Plan: CSW A. Felton ClintonFigueroa [place] [facility] Production designer, theatre/television/filmmanager for additional assistance however [place] [facility]. Pt reports she is feeling better since last visit and has essentials ready for newborn. Pt reports she does not need additional community support services   Patty SaxonFigueroa, Doctor [place], [place] 02/19/2018 1:33 PM

## 2018-02-19 NOTE — MAU Note (Signed)
Pain in suprapubic area and low back. Pain with urination. Pee is a very brown color.  Sent from office, ? Kidney stone.  Been getting sick, unable to keep stuff down. Saw a speckle of blood when wiped last night.

## 2018-02-19 NOTE — Progress Notes (Signed)
   PRENATAL VISIT NOTE  Subjective:  Patty Mata is a 27 y.o. G2P1001 at 30w5dbeing seen today for ongoing prenatal care.  She is currently monitored for the following issues for this high-risk pregnancy and has Supervision of other normal pregnancy, antepartum; Tobacco smoking affecting pregnancy in second trimester; Frequent UTI; Anxiety and depression; Rubella non-immune status, antepartum; and No leakage of amniotic fluid into vagina on their problem list.  Patient reports backache and creamy, white vaginal discharge.  Contractions: Irritability. Vag. Bleeding: None.  Movement: Present. Denies leaking of fluid.   The following portions of the patient's history were reviewed and updated as appropriate: allergies, current medications, past family history, past medical history, past social history, past surgical history and problem list. Problem list updated.  Objective:   Vitals:   02/19/18 1116  BP: 112/77  Pulse: (!) 105  Temp: 97.6 F (36.4 C)  Weight: 133 lb 12.8 oz (60.7 kg)    Fetal Status: Fetal Heart Rate (bpm): 154 Fundal Height: 28 cm Movement: Present  Presentation: Vertex  General:  Alert, oriented and cooperative. Patient is in no acute distress.  Skin: Skin is warm and dry. No rash noted.   Cardiovascular: Normal heart rate noted  Respiratory: Normal respiratory effort, no problems with respiration noted  Abdomen: Soft, gravid, appropriate for gestational age.  Pain/Pressure: Present     Pelvic: Cervical exam performed Dilation: Closed Effacement (%): Thick Station: Ballotable  Extremities: Normal range of motion.  Edema: None  Mental Status: Normal mood and affect. Normal behavior. Normal judgment and thought content.  (+) CVAT  Assessment and Plan:  Pregnancy: G2P1001 at 354w5d1. Dysuria during pregnancy, antepartum - Culture, OB Urine - POC Urinalysis Dipstick OB - To MAU for evaluation of possible kidney stone or pyelo // call to M. BhDrake LeachCNM to  notify of patient's coming for evaluation  2. Supervision of other normal pregnancy, antepartum - Culture, beta strep (group b only) - Cervicovaginal ancillary only - Met with CSW, AnLynnea Ferriero discuss multiple social issues and constraints  Preterm labor symptoms and general obstetric precautions including but not limited to vaginal bleeding, contractions, leaking of fluid and fetal movement were reviewed in detail with the patient. Please refer to After Visit Summary for other counseling recommendations.  Return in about 2 weeks (around 03/05/2018) for Return OB visit.  Future Appointments  Date Time Provider [place]12/01/2018  3:10 PM GoClent DemarkPA-C [facility]    [place]

## 2018-02-19 NOTE — Patient Instructions (Signed)

## 2018-02-19 NOTE — MAU Provider Note (Signed)
History     CSN: [id number]  Arrival date and time: 02/19/18 1226   First Provider Initiated Contact with Patient 02/19/18 1313      Chief Complaint  Patient presents with  . Abdominal Pain  . Back Pain  . Dysuria   G2P1001 @35 .5 wks sent from office for back pain. Reports 3 day hx of malodorous urine with dark color. Denies dysuria, hematuria, and fever. Having difficulty starting stream. Reports LBP, worse on right over last 2 days. Reports N/V over the last 2 days. None today. Good FM. No Vb, LOF, or ctx. Hx of nephrolithiasis and pyelonephritis.   OB History    Gravida  2   Para  1   Term  1   Preterm      AB      Living  1     SAB      TAB      Ectopic      Multiple      Live Births  1           Past Medical History:  Diagnosis Date  . Anxiety   . Kidney stones     Past Surgical History:  Procedure Laterality Date  . STENT PLACEMENT NON-VASCULAR ([facility] HX)     renal    Family History  Problem Relation Age of Onset  . Depression Mother   . Arthritis Mother   . Asthma Father   . Mental illness Father   . Cancer Maternal Grandfather     Social History   Tobacco Use  . Smoking status: Current Every Day Smoker    Packs/day: 0.25    Years: 5.00    Pack years: 1.25    Types: Cigarettes  . Smokeless tobacco: Never Used  Substance Use Topics  . Alcohol use: Not Currently    Comment: Socially; denies today  . 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 Penicillins Rash    Has patient had a PCN reaction causing immediate rash, facial/tongue/throat swelling, SOB or lightheadedness with hypotension: Yes Has patient had a PCN reaction causing severe rash involving mucus membranes or skin necrosis: No Has patient had a PCN reaction that required hospitalization: No Has patient had a PCN reaction occurring within the last 10 years: No If all of the above  answers are "NO", then may proceed with Cephalosporin use.     Medications Prior to Admission  Medication Sig Dispense Refill Last Dose  . Prenatal Vit-Fe Fumarate-FA (PRENATAL MULTIVITAMIN) TABS tablet Take 1 tablet by mouth daily at 12 noon.   Taking    Review of Systems  Constitutional: Negative for chills and fever.  Gastrointestinal: Positive for nausea and vomiting. Negative for abdominal pain, constipation and diarrhea.  Genitourinary: Positive for difficulty urinating. Negative for dysuria, flank pain, frequency, hematuria, urgency, vaginal bleeding and vaginal discharge.  Musculoskeletal: Positive for back pain.   Physical Exam   Blood pressure 111/75, pulse (!) 104, temperature 98.4 F (36.9 C), temperature source Oral, resp. rate 18, last menstrual period 06/13/2017, SpO2 100 %.  Physical Exam  Constitutional: She is oriented to person, place, and time. She appears well-developed and well-nourished. No distress.  HENT:  Head: Normocephalic and atraumatic.  Neck: Normal range of motion.  Respiratory: Effort normal. No respiratory distress.  GI: Soft. She exhibits no distension. There is no tenderness. There is no CVA tenderness.  Musculoskeletal:  Normal range of motion.       Lumbar back: She exhibits tenderness (right).  Neurological: She is alert and oriented to person, place, and time.  Skin: Skin is warm and dry.  Psychiatric: She has a normal mood and affect.  EFM: 135 bpm, mod variability, + accels, no decels Toco: irregular  Results for orders placed or performed during the hospital encounter of 02/19/18 (from the past 24 hour(s))  Urinalysis, Routine w reflex microscopic     Status: Abnormal   Collection Time: 02/19/18  1:26 PM  Result Value Ref Range   Color, Urine AMBER (A) YELLOW   APPearance HAZY (A) CLEAR   Specific Gravity, Urine 1.023 1.005 - 1.030   pH 6.0 5.0 - 8.0   Glucose, UA NEGATIVE NEGATIVE mg/dL   Hgb urine dipstick LARGE (A) NEGATIVE    Bilirubin Urine NEGATIVE NEGATIVE   Ketones, ur 5 (A) NEGATIVE mg/dL   Protein, ur 30 (A) NEGATIVE mg/dL   Nitrite NEGATIVE NEGATIVE   Leukocytes, UA TRACE (A) NEGATIVE   RBC / HPF >50 (H) 0 - 5 RBC/hpf   WBC, UA 11-20 0 - 5 WBC/hpf   Bacteria, UA RARE (A) NONE SEEN   Squamous Epithelial / LPF 6-10 0 - 5   Mucus PRESENT    Ca Oxalate Crys, UA PRESENT   CBC     Status: Abnormal   Collection Time: 02/19/18  1:38 PM  Result Value Ref Range   WBC 9.4 4.0 - 10.5 K/uL   RBC 3.45 (L) 3.87 - 5.11 MIL/uL   Hemoglobin 10.7 (L) 12.0 - 15.0 g/dL   HCT 81.1 (L) 91.4 - 78.2 %   MCV 94.8 80.0 - 100.0 fL   MCH 31.0 26.0 - 34.0 pg   MCHC 32.7 30.0 - 36.0 g/dL   RDW 95.6 21.3 - 08.6 %   Platelets 155 150 - 400 K/uL   nRBC 0.0 0.0 - 0.2 %  Comprehensive metabolic panel     Status: Abnormal   Collection Time: 02/19/18  1:38 PM  Result Value Ref Range   Sodium 134 (L) 135 - 145 mmol/L   Potassium 3.9 3.5 - 5.1 mmol/L   Chloride 105 98 - 111 mmol/L   CO2 21 (L) 22 - 32 mmol/L   Glucose, Bld 77 70 - 99 mg/dL   BUN 11 6 - 20 mg/dL   Creatinine, Ser 5.78 0.44 - 1.00 mg/dL   Calcium 8.6 (L) 8.9 - 10.3 mg/dL   Total Protein 6.4 (L) 6.5 - 8.1 g/dL   Albumin 2.8 (L) 3.5 - 5.0 g/dL   AST 45 (H) 15 - 41 U/L   ALT 65 (H) 0 - 44 U/L   Alkaline Phosphatase 143 (H) 38 - 126 U/L   Total Bilirubin 0.5 0.3 - 1.2 mg/dL   GFR calc non Af Amer >60 >60 mL/min   GFR calc Af Amer >60 >60 mL/min   Anion gap 8 5 - 15   US Renal  Result Date: 02/19/2018 CLINICAL DATA:  RIGHT flank pain, [redacted] weeks pregnant, history kidney stones EXAM: RENAL / URINARY TRACT ULTRASOUND COMPLETE COMPARISON:  11/26/2017 FINDINGS: Right Kidney: Renal measurements: 10.2 x 4.7 x 5.6 cm = volume: 140.0 mL. Normal cortical thickness and echogenicity. Mild hydronephrosis. No renal mass lesion. Shadowing echogenic focus at upper pole 7 mm diameter. Several additional nonshadowing echogenic foci are seen, nonspecific, could represent  nonshadowing calculi or artifacts. Left Kidney: Renal measurements: 10.2 x 4.5 x 4.7 cm = volume:  113.9 mL. Normal echogenicity. Minimal collecting system dilatation. No mass. Several nonshadowing echogenic foci are seen, 8 mm and 5 mm, nonspecific, could represent nonshadowing calculi or artifacts. Bladder: Bladder unremarkable.  Ureteral jets were not visualized. IMPRESSION: Mild RIGHT hydronephrosis and minimal prominence of the LEFT renal collecting system. No evidence of renal mass. Echogenic foci are seen in both kidneys, likely representing a 7 mm nonobstructing calculus at the upper pole of the RIGHT kidney with the additional echogenic foci bilaterally nonspecific, without shadowing, question nonshadowing calculi or artifacts. Electronically Signed   By: Ulyses SouthwardMark  Boles M.D.   On: 02/19/2018 16:25   MAU Course  Procedures LR Dilaudid  MDM Labs and renal US ordered and reviewed. Pain improved. No evidence of pyelo. UC pending from office. Elevated liver enzymes today, BP normal and pt denies overuse of Tylenol, will add hep panel and bile salts. Nephrolithiasis identified, non-obstructing, will plan for Flomax and outpt Urology f/u. Stable for discharge home.   Assessment and Plan  [redacted] weeks gestation Reactive NST Nephrolithiasis Elevated liver enzymes  Discharge home Follow up in OB office as scheduled Follow up with [facility] in 1 week Hydrate Rx Vicodin Rx Flomax  Allergies as of 02/19/2018      Reactions   Penicillins Rash   Has patient had a PCN reaction causing immediate rash, facial/tongue/throat swelling, SOB or lightheadedness with hypotension: Yes Has patient had a PCN reaction causing severe rash involving mucus membranes or skin necrosis: No Has patient had a PCN reaction that required hospitalization: No Has patient had a PCN reaction occurring within the last 10 years: No If all of the above answers are "NO", then may proceed with Cephalosporin use.       Medication List    TAKE these medications   HYDROcodone-acetaminophen 5-325 MG tablet Commonly known as:  NORCO/VICODIN Take 1 tablet by mouth every 4 (four) hours as needed for moderate pain.   prenatal multivitamin Tabs tablet Take 1 tablet by mouth daily at 12 noon.   tamsulosin 0.4 MG Caps capsule Commonly known as:  FLOMAX Take 1 capsule (0.4 mg total) by mouth daily after breakfast.      Donette LarryMelanie Verlisa Vara, CNM 02/19/2018, 1:22 PM

## 2018-02-19 NOTE — Discharge Instructions (Signed)

## 2018-02-20 ENCOUNTER — Inpatient Hospital Stay (HOSPITAL_COMMUNITY)
Admission: AD | Admit: 2018-02-20 | Discharge: 2018-02-20 | Disposition: A | Discharge status: Home / Self Care | Payer: Medicaid Other | Source: Ambulatory Visit | Attending: Obstetrics & Gynecology | Admitting: Obstetrics & Gynecology

## 2018-02-20 ENCOUNTER — Other Ambulatory Visit: Payer: Self-pay | Admitting: Obstetrics and Gynecology

## 2018-02-20 ENCOUNTER — Encounter (HOSPITAL_COMMUNITY): Payer: Self-pay

## 2018-02-20 DIAGNOSIS — R300 Dysuria: Principal | ICD-10-CM

## 2018-02-20 DIAGNOSIS — Z87442 Personal history of urinary calculi: Secondary | ICD-10-CM | POA: Insufficient documentation

## 2018-02-20 DIAGNOSIS — O26899 Other specified pregnancy related conditions, unspecified trimester: Secondary | ICD-10-CM

## 2018-02-20 DIAGNOSIS — F17210 Nicotine dependence, cigarettes, uncomplicated: Secondary | ICD-10-CM | POA: Diagnosis not present

## 2018-02-20 DIAGNOSIS — O26893 Other specified pregnancy related conditions, third trimester: Secondary | ICD-10-CM | POA: Diagnosis present

## 2018-02-20 DIAGNOSIS — N200 Calculus of kidney: Secondary | ICD-10-CM | POA: Diagnosis not present

## 2018-02-20 DIAGNOSIS — Z3A35 35 weeks gestation of pregnancy: Secondary | ICD-10-CM

## 2018-02-20 DIAGNOSIS — Z3480 Encounter for supervision of other normal pregnancy, unspecified trimester: Secondary | ICD-10-CM

## 2018-02-20 DIAGNOSIS — R7989 Other specified abnormal findings of blood chemistry: Secondary | ICD-10-CM

## 2018-02-20 DIAGNOSIS — O9989 Other specified diseases and conditions complicating pregnancy, childbirth and the puerperium: Secondary | ICD-10-CM

## 2018-02-20 DIAGNOSIS — Z0371 Encounter for suspected problem with amniotic cavity and membrane ruled out: Secondary | ICD-10-CM

## 2018-02-20 DIAGNOSIS — O99332 Smoking (tobacco) complicating pregnancy, second trimester: Secondary | ICD-10-CM

## 2018-02-20 DIAGNOSIS — R945 Abnormal results of liver function studies: Secondary | ICD-10-CM

## 2018-02-20 LAB — HEPATITIS PANEL, ACUTE
HCV Ab: <0.1 s/co ratio (ref 0.0–0.9)
HEP B S AG: NEGATIVE
Hep A IgM: NEGATIVE
Hep B C IgM: NEGATIVE

## 2018-02-20 MED ORDER — FLAVOXATE HCL 100 MG PO TABS
100.0000 mg | ORAL_TABLET | Freq: Once | ORAL | Status: DC
  Filled 2018-02-20: qty 1

## 2018-02-20 MED ORDER — FLAVOXATE HCL 100 MG PO TABS: 200.0000 mg | ORAL_TABLET | Freq: Three times a day (TID) | ORAL | 0 refills | Status: DC | PRN

## 2018-02-20 MED ORDER — TAMSULOSIN HCL 0.4 MG PO CAPS: 0.8000 mg | ORAL_CAPSULE | Freq: Every day | ORAL | 0 refills | Status: DC

## 2018-02-20 MED ORDER — HYDROMORPHONE HCL 2 MG PO TABS
4.0000 mg | ORAL_TABLET | Freq: Once | ORAL | Status: AC
  Administered 2018-02-20: 4 mg via ORAL
  Filled 2018-02-20: qty 2

## 2018-02-20 MED ORDER — PHENAZOPYRIDINE HCL 200 MG PO TABS: 200.0000 mg | ORAL_TABLET | Freq: Three times a day (TID) | ORAL | 0 refills | Status: DC | PRN

## 2018-02-20 MED ORDER — HYDROMORPHONE HCL 4 MG PO TABS: 4.0000 mg | ORAL_TABLET | ORAL | 0 refills | Status: AC | PRN

## 2018-02-20 NOTE — MAU Note (Signed)
Pt has had increasing back and urethral pain. Back pain is 9 and urethral pain is 10. Took Flomax this morning and pain medication at 9am. She said feels like a stone is trying to come out but has not seen a stone yet.

## 2018-02-20 NOTE — Progress Notes (Signed)
D/c'd Rx for [facility]ris[facility]. Changed Rx to Pyridium  Raelyn Moraolitta Aydia Maj, CNM

## 2018-02-20 NOTE — MAU Provider Note (Addendum)
History     CSN: [id number]  Arrival date and time: 02/20/18 1330   First Provider Initiated Contact with Patient 02/20/18 1445      Chief Complaint  Patient presents with  . Back Pain  . Pelvic Pain   HPI  Ms.  Patty Mata is a 27 y.o. year old 302P1001 female at 7837w6d weeks gestation who presents to MAU reporting increasing back and urethral pain. [facility] for evaluation of kidney stone. She was dx'd with 7 mm kidney stone on RT side, Rx'd Flomax and Vicodin. She states the "Vicodin hasn't touched her pain." She has only taken 1 dose of the Vicodin; at 0900 this morning. She is unable to sit still in a chair for any length of time. She call [facility] this morning and was told to come to MAU for re-evaluation. Patient very tearful stating that she "just wants this shit out of me!" An outpatient referral was made to [facility], but the patient has not heard from the office yet. She has a h/o kidney stones and surgery for kidney stones back in 2017.  Past Medical History:  Diagnosis Date  . Anxiety   . Kidney stones     Past Surgical History:  Procedure Laterality Date  . STENT PLACEMENT NON-VASCULAR ([facility] HX)     renal    Family History  Problem Relation Age of Onset  . Depression Mother   . Arthritis Mother   . Asthma Father   . Mental illness Father   . Cancer Maternal Grandfather     Social History   Tobacco Use  . Smoking status: Current Every Day Smoker    Packs/day: 0.25    Years: 5.00    Pack years: 1.25    Types: Cigarettes  . Smokeless tobacco: Never Used  Substance Use Topics  . Alcohol use: Not Currently    Comment: Socially; denies today  . 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 Penicillins Rash    Has patient had a PCN reaction causing immediate rash, facial/tongue/throat swelling, SOB or lightheadedness  with hypotension: Yes Has patient had a PCN reaction causing severe rash involving mucus membranes or skin necrosis: No Has patient had a PCN reaction that required hospitalization: No Has patient had a PCN reaction occurring within the last 10 years: No If all of the above answers are "NO", then may proceed with Cephalosporin use.     Medications Prior to Admission  Medication Sig Dispense Refill Last Dose  . HYDROcodone-acetaminophen (NORCO/VICODIN) 5-325 MG tablet Take 1 tablet by mouth every 4 (four) hours as needed for moderate pain. 20 tablet 0   . Prenatal Vit-Fe Fumarate-FA (PRENATAL MULTIVITAMIN) TABS tablet Take 1 tablet by mouth daily at 12 noon.   Taking  . tamsulosin (FLOMAX) 0.4 MG CAPS capsule Take 1 capsule (0.4 mg total) by mouth daily after breakfast. 30 capsule 0     Review of Systems  Constitutional: Negative.   HENT: Negative.   Eyes: Negative.   Respiratory: Negative.   Gastrointestinal: Negative.   Endocrine: Negative.   Genitourinary: Positive for dysuria ("I can feel the stone moving like it's trying to come out where I pee from; it hurts).  Musculoskeletal: Positive for back pain (RT side, now some on LT side).  Skin: Negative.   Allergic/Immunologic: Negative.   Neurological: Positive for  weakness.  Hematological: Negative.   Psychiatric/Behavioral: Negative.    Physical Exam   Blood pressure 122/73, pulse (!) 104, temperature 98.4 F (36.9 C), temperature source Oral, last menstrual period 06/13/2017.  Physical Exam  Nursing note and vitals reviewed. Constitutional: She is oriented to person, place, and time. She appears well-developed and well-nourished. She appears distressed.  HENT:  Head: Normocephalic and atraumatic.  Eyes: Pupils are equal, round, and reactive to light.  Neck: Normal range of motion.  Cardiovascular: Normal rate.  Respiratory: Effort normal.  GI: Soft. There is CVA tenderness (RT side).  Genitourinary:  Genitourinary  Comments: Not indicated  Musculoskeletal: Normal range of motion.  Neurological: She is alert and oriented to person, place, and time.  Skin: Skin is warm and dry.  Psychiatric: She has a normal mood and affect. Her behavior is normal. Judgment and thought content normal.    MAU Course  Procedures  MDM Dilaudid 4 mg po -- improving pain PO hydration NST - FHR: 135 bpm / moderate variability / accels present / decels absent / TOCO: none  *Consult with Dr. Macon Large @ 1500 - notified of patient's complaints, assessments - recommended tx plan: Rx Dilaudid 4 mg every 4 hrs, increase Flomax to 0.8 mg daily, Rx Urispas 200 mg TID or Pyridium 200 mg TID, have pt call [facility] to get scheduled for F/U  Assessment and Plan  Right nephrolithiasis - Plan: Discharge patient - Rx Dilaudid 4 mg every 4 hrs, increase Flomax to 0.8 mg daily, Rx Urispas 200 mg TID or Pyridium 200 mg TID, have pt call [facility] to get scheduled for F/U - TC to Pharmacy to notify of d/c Urispas Rx d/t need for prior authorization - Rx changed to Pyridium - Keep scheduled OB appt with [facility] on 12/12 or prn - Patient verbalized an understanding of the plan of care and agrees.   Raelyn Mora, MSN, CNM 02/20/2018, 2:45 PM

## 2018-02-20 NOTE — MAU Note (Signed)
Urine sent to lab 

## 2018-02-21 LAB — BILE ACIDS, TOTAL: Bile Acids Total: 10.4 umol/L — ABNORMAL HIGH (ref 0.0–10.0)

## 2018-02-23 LAB — CULTURE, BETA STREP (GROUP B ONLY): STREP GP B CULTURE: NEGATIVE

## 2018-02-23 LAB — CERVICOVAGINAL ANCILLARY ONLY
BACTERIAL VAGINITIS: NEGATIVE
Candida vaginitis: NEGATIVE
Chlamydia: NEGATIVE
NEISSERIA GONORRHEA: NEGATIVE
TRICH (WINDOWPATH): NEGATIVE

## 2018-02-23 LAB — URINE CULTURE, OB REFLEX

## 2018-02-23 LAB — CULTURE, OB URINE

## 2018-02-23 NOTE — Telephone Encounter (Signed)
Called patient to see how she is feeling. She is not feeling any better. She was unable to pickup medication that was prescribed. The pharmacy had to order the medication and it would not be ready until today around 11 or 12. Pt stated she was going to call the urologist today to schedule an appointment. Advised to send nurse a MyChart message regarding the appointment and if she was able to pickup medication.  Clovis PuMartin, Tamika L, RN

## 2018-02-23 NOTE — Telephone Encounter (Signed)
-----   Message from Raelyn Moraolitta Dawson, [place] sent at 02/20/2018  3:57 PM EST ----- Regarding: Call to reassess condition Please call her on Monday 11/25 to find out if she is feeling better. Also, make sure she calls [facility] to schedule an appointment to be seen there. She needs to understand that they will not call her to schedule an appointment.   Thank you! Rolitta

## 2018-02-24 ENCOUNTER — Telehealth: Payer: Self-pay

## 2018-02-24 NOTE — Telephone Encounter (Signed)
Called pt and asked her per Dr. Macon LargeAnyanwu, if she could come in for repeat CMET & Bile Acids Test,No answer, left VM & stated that she can just walk in either today or tomorrow.

## 2018-02-24 NOTE — Telephone Encounter (Signed)
-----   Message from Tereso NewcomerUgonna A Anyanwu, MD sent at 02/23/2018  4:36 PM EST ----- Have patient return for repeat CMET and bile acids tomorrow or 11/26 (send this STAT so we get results fast).

## 2018-03-02 ENCOUNTER — Telehealth: Payer: Self-pay | Admitting: *Deleted

## 2018-03-02 NOTE — Telephone Encounter (Signed)
Patient called stating she is in a lot of pain due to the kidney stones. She has an appointment with urologist 03/19/18 at 12:45 PM. She has been out of hydromorphone every 4 hours for 2 days now. The medication has helped with pain. She has been trying tylenol but does not help. Pt stated she can't get out of bed due to the pain. Please advise. Pt has an appointment on 03/05/18 for ROB.  Clovis PuMartin, Tamika L, RN

## 2018-03-03 ENCOUNTER — Inpatient Hospital Stay (HOSPITAL_COMMUNITY)
Admission: AD | Admit: 2018-03-03 | Discharge: 2018-03-07 | DRG: 796 | Disposition: A | Discharge status: Home / Self Care | Payer: Medicaid Other | Attending: Obstetrics and Gynecology | Admitting: Obstetrics and Gynecology

## 2018-03-03 ENCOUNTER — Encounter (HOSPITAL_COMMUNITY): Payer: Self-pay

## 2018-03-03 ENCOUNTER — Other Ambulatory Visit: Payer: Self-pay | Admitting: Obstetrics and Gynecology

## 2018-03-03 DIAGNOSIS — O1414 Severe pre-eclampsia complicating childbirth: Secondary | ICD-10-CM | POA: Diagnosis present

## 2018-03-03 DIAGNOSIS — N200 Calculus of kidney: Secondary | ICD-10-CM | POA: Diagnosis present

## 2018-03-03 DIAGNOSIS — Z0371 Encounter for suspected problem with amniotic cavity and membrane ruled out: Secondary | ICD-10-CM

## 2018-03-03 DIAGNOSIS — O26833 Pregnancy related renal disease, third trimester: Secondary | ICD-10-CM | POA: Diagnosis present

## 2018-03-03 DIAGNOSIS — O99334 Smoking (tobacco) complicating childbirth: Secondary | ICD-10-CM | POA: Diagnosis present

## 2018-03-03 DIAGNOSIS — Z880 Allergy status to penicillin: Secondary | ICD-10-CM | POA: Diagnosis not present

## 2018-03-03 DIAGNOSIS — O99332 Smoking (tobacco) complicating pregnancy, second trimester: Secondary | ICD-10-CM

## 2018-03-03 DIAGNOSIS — R7989 Other specified abnormal findings of blood chemistry: Secondary | ICD-10-CM

## 2018-03-03 DIAGNOSIS — Z302 Encounter for sterilization: Secondary | ICD-10-CM | POA: Diagnosis not present

## 2018-03-03 DIAGNOSIS — O2662 Liver and biliary tract disorders in childbirth: Secondary | ICD-10-CM | POA: Diagnosis present

## 2018-03-03 DIAGNOSIS — Z3480 Encounter for supervision of other normal pregnancy, unspecified trimester: Secondary | ICD-10-CM

## 2018-03-03 DIAGNOSIS — F17210 Nicotine dependence, cigarettes, uncomplicated: Secondary | ICD-10-CM | POA: Diagnosis present

## 2018-03-03 DIAGNOSIS — O1413 Severe pre-eclampsia, third trimester: Secondary | ICD-10-CM

## 2018-03-03 DIAGNOSIS — Z3A37 37 weeks gestation of pregnancy: Secondary | ICD-10-CM

## 2018-03-03 DIAGNOSIS — K831 Obstruction of bile duct: Secondary | ICD-10-CM | POA: Diagnosis present

## 2018-03-03 DIAGNOSIS — O99333 Smoking (tobacco) complicating pregnancy, third trimester: Secondary | ICD-10-CM

## 2018-03-03 DIAGNOSIS — R945 Abnormal results of liver function studies: Secondary | ICD-10-CM

## 2018-03-03 LAB — COMPREHENSIVE METABOLIC PANEL
ALK PHOS: 201 U/L — AB (ref 38–126)
ALT: 253 U/L — AB (ref 0–44)
AST: 156 U/L — AB (ref 15–41)
Albumin: 2.7 g/dL — ABNORMAL LOW (ref 3.5–5.0)
Anion gap: 8 (ref 5–15)
BILIRUBIN TOTAL: 0.5 mg/dL (ref 0.3–1.2)
BUN: 7 mg/dL (ref 6–20)
CHLORIDE: 107 mmol/L (ref 98–111)
CO2: 22 mmol/L (ref 22–32)
Calcium: 8.7 mg/dL — ABNORMAL LOW (ref 8.9–10.3)
Creatinine, Ser: 0.62 mg/dL (ref 0.44–1.00)
GLUCOSE: 77 mg/dL (ref 70–99)
POTASSIUM: 4.1 mmol/L (ref 3.5–5.1)
Sodium: 137 mmol/L (ref 135–145)
Total Protein: 6.6 g/dL (ref 6.5–8.1)

## 2018-03-03 LAB — URINALYSIS, ROUTINE W REFLEX MICROSCOPIC
BILIRUBIN URINE: NEGATIVE
Glucose, UA: NEGATIVE mg/dL
Ketones, ur: NEGATIVE mg/dL
LEUKOCYTES UA: NEGATIVE
NITRITE: NEGATIVE
Protein, ur: NEGATIVE mg/dL
RBC / HPF: >50 RBC/hpf — ABNORMAL HIGH (ref 0–5)
SPECIFIC GRAVITY, URINE: 1.008 (ref 1.005–1.030)
pH: 8 (ref 5.0–8.0)

## 2018-03-03 LAB — RAPID URINE DRUG SCREEN, HOSP PERFORMED
Amphetamines: NOT DETECTED
Barbiturates: NOT DETECTED
Benzodiazepines: POSITIVE — AB
Cocaine: NOT DETECTED
Opiates: POSITIVE — AB
Tetrahydrocannabinol: POSITIVE — AB

## 2018-03-03 LAB — CBC
HCT: 36.4 % (ref 36.0–46.0)
HEMOGLOBIN: 11.7 g/dL — AB (ref 12.0–15.0)
MCH: 30.5 pg (ref 26.0–34.0)
MCHC: 32.1 g/dL (ref 30.0–36.0)
MCV: 94.8 fL (ref 80.0–100.0)
Platelets: 179 10*3/uL (ref 150–400)
RBC: 3.84 MIL/uL — AB (ref 3.87–5.11)
RDW: 12.6 % (ref 11.5–15.5)
WBC: 9.9 10*3/uL (ref 4.0–10.5)
nRBC: 0 % (ref 0.0–0.2)

## 2018-03-03 LAB — CBC WITH DIFFERENTIAL/PLATELET
BASOS ABS: 0 10*3/uL (ref 0.0–0.1)
Basophils Relative: 0 %
EOS PCT: 0 %
Eosinophils Absolute: 0 10*3/uL (ref 0.0–0.5)
HEMATOCRIT: 34.8 % — AB (ref 36.0–46.0)
HEMOGLOBIN: 11.3 g/dL — AB (ref 12.0–15.0)
LYMPHS ABS: 1.7 10*3/uL (ref 0.7–4.0)
LYMPHS PCT: 19 %
MCH: 30.5 pg (ref 26.0–34.0)
MCHC: 32.5 g/dL (ref 30.0–36.0)
MCV: 93.8 fL (ref 80.0–100.0)
Monocytes Absolute: 0.5 10*3/uL (ref 0.1–1.0)
Monocytes Relative: 6 %
NEUTROS ABS: 6.9 10*3/uL (ref 1.7–7.7)
NRBC: 0 % (ref 0.0–0.2)
Neutrophils Relative %: 75 %
PLATELETS: 175 10*3/uL (ref 150–400)
RBC: 3.71 MIL/uL — AB (ref 3.87–5.11)
RDW: 12.7 % (ref 11.5–15.5)
WBC: 9.1 10*3/uL (ref 4.0–10.5)

## 2018-03-03 LAB — TYPE AND SCREEN
ABO/RH(D): O POS
Antibody Screen: NEGATIVE

## 2018-03-03 MED ORDER — OXYCODONE-ACETAMINOPHEN 5-325 MG PO TABS: 1.0000 | ORAL_TABLET | ORAL | Status: DC | PRN

## 2018-03-03 MED ORDER — LIDOCAINE HCL (PF) 1 % IJ SOLN
30.0000 mL | INTRAMUSCULAR | Status: DC | PRN
  Filled 2018-03-03: qty 30

## 2018-03-03 MED ORDER — SOD CITRATE-CITRIC ACID 500-334 MG/5ML PO SOLN: 30.0000 mL | ORAL | Status: DC | PRN

## 2018-03-03 MED ORDER — OXYTOCIN BOLUS FROM INFUSION
500.0000 mL | Freq: Once | INTRAVENOUS | Status: AC
  Administered 2018-03-04: 500 mL via INTRAVENOUS

## 2018-03-03 MED ORDER — HYDROMORPHONE HCL 2 MG PO TABS: 2.0000 mg | ORAL_TABLET | ORAL | 0 refills | Status: AC | PRN

## 2018-03-03 MED ORDER — OXYTOCIN 40 UNITS IN LACTATED RINGERS INFUSION - SIMPLE MED
2.5000 [IU]/h | INTRAVENOUS | Status: DC
  Filled 2018-03-03: qty 1000

## 2018-03-03 MED ORDER — MISOPROSTOL 50MCG HALF TABLET
50.0000 ug | ORAL_TABLET | ORAL | Status: DC
  Administered 2018-03-03 – 2018-03-04 (×2): 50 ug via BUCCAL
  Filled 2018-03-03 (×2): qty 1

## 2018-03-03 MED ORDER — HYDROMORPHONE HCL 1 MG/ML IJ SOLN
0.5000 mg | Freq: Once | INTRAMUSCULAR | Status: AC
  Administered 2018-03-03: 0.5 mg via INTRAMUSCULAR
  Filled 2018-03-03: qty 1

## 2018-03-03 MED ORDER — ACETAMINOPHEN 325 MG PO TABS: 650.0000 mg | ORAL_TABLET | ORAL | Status: DC | PRN

## 2018-03-03 MED ORDER — LACTATED RINGERS IV SOLN
INTRAVENOUS | Status: DC
  Administered 2018-03-03 – 2018-03-04 (×3): via INTRAVENOUS

## 2018-03-03 MED ORDER — TERBUTALINE SULFATE 1 MG/ML IJ SOLN: 0.2500 mg | Freq: Once | INTRAMUSCULAR | Status: DC | PRN

## 2018-03-03 MED ORDER — OXYCODONE-ACETAMINOPHEN 5-325 MG PO TABS: 2.0000 | ORAL_TABLET | ORAL | Status: DC | PRN

## 2018-03-03 MED ORDER — PROMETHAZINE HCL 25 MG/ML IJ SOLN
25.0000 mg | Freq: Four times a day (QID) | INTRAMUSCULAR | Status: DC | PRN
  Administered 2018-03-03: 25 mg via INTRAMUSCULAR
  Filled 2018-03-03: qty 1

## 2018-03-03 MED ORDER — OXYTOCIN 40 UNITS IN LACTATED RINGERS INFUSION - SIMPLE MED
1.0000 m[IU]/min | INTRAVENOUS | Status: DC
  Administered 2018-03-04: 2 m[IU]/min via INTRAVENOUS

## 2018-03-03 MED ORDER — FENTANYL CITRATE (PF) 100 MCG/2ML IJ SOLN
100.0000 ug | INTRAMUSCULAR | Status: DC | PRN
  Administered 2018-03-03 (×3): 100 ug via INTRAVENOUS
  Filled 2018-03-03 (×3): qty 2

## 2018-03-03 MED ORDER — LACTATED RINGERS IV SOLN
500.0000 mL | INTRAVENOUS | Status: DC | PRN
  Administered 2018-03-04: 500 mL via INTRAVENOUS

## 2018-03-03 MED ORDER — ONDANSETRON HCL 4 MG/2ML IJ SOLN: 4.0000 mg | Freq: Four times a day (QID) | INTRAMUSCULAR | Status: DC | PRN

## 2018-03-03 NOTE — MAU Provider Note (Addendum)
History     CSN: [id number]  Arrival date and time: 03/03/18 1534   First Provider Initiated Contact with Patient 03/03/18 1636      Chief Complaint  Patient presents with  . Flank Pain   Patty Mata is a 27 y.o. G2P1001 at [redacted]w[redacted]d with known kidney stones. She was seen here on 11/22 and given pain medication. She has since used all of the dilaudid that she was prescribed. She states that she does not have an appointment with urology until 12/19. She is here now with worsening pain, nausea and vomiting. She denies any obstetrical complaints: contractions, VB or LOF. She reports normal fetal movement. Patient states that she has been alternating tylenol and ibuprofen for pain.   Flank Pain     OB History    Gravida  2   Para  1   Term  1   Preterm      AB      Living  1     SAB      TAB      Ectopic      Multiple      Live Births  1           Past Medical History:  Diagnosis Date  . Anxiety   . Kidney stones     Past Surgical History:  Procedure Laterality Date  . STENT PLACEMENT NON-VASCULAR ([facility] HX)     renal    Family History  Problem Relation Age of Onset  . Depression Mother   . Arthritis Mother   . Asthma Father   . Mental illness Father   . Cancer Maternal Grandfather     Social History   Tobacco Use  . Smoking status: Current Every Day Smoker    Packs/day: 0.25    Years: 5.00    Pack years: 1.25    Types: Cigarettes  . Smokeless tobacco: Never Used  Substance Use Topics  . Alcohol use: Not Currently    Comment: Socially; denies today  . 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 Penicillins Rash    Has patient had a PCN reaction causing immediate rash, facial/tongue/throat swelling, SOB or lightheadedness with hypotension: Yes Has patient had a PCN reaction causing severe rash involving mucus membranes or skin necrosis: No Has patient  had a PCN reaction that required hospitalization: No Has patient had a PCN reaction occurring within the last 10 years: No If all of the above answers are "NO", then may proceed with Cephalosporin use.     Medications Prior to Admission  Medication Sig Dispense Refill Last Dose  . phenazopyridine (PYRIDIUM) 200 MG tablet Take 1 tablet (200 mg total) by mouth 3 (three) times daily as needed for pain. 10 tablet 0 03/03/2018 at Unknown time  . Prenatal Vit-Fe Fumarate-FA (PRENATAL MULTIVITAMIN) TABS tablet Take 1 tablet by mouth daily at 12 noon.   03/03/2018 at Unknown time  . tamsulosin (FLOMAX) 0.4 MG CAPS capsule Take 2 capsules (0.8 mg total) by mouth daily after breakfast. 30 capsule 0 03/03/2018 at Unknown time    Review of Systems  Genitourinary: Positive for flank pain.   Physical Exam   Blood pressure 132/81, pulse 77, temperature 97.7 F (36.5 C), temperature source Oral, resp. rate 18, weight 62.4 kg, last menstrual period 06/13/2017.  Physical Exam  Nursing note and vitals reviewed. Constitutional: She is oriented to  person, place, and time. She appears well-developed and well-nourished. No distress.  HENT:  Head: Normocephalic.  Cardiovascular: Normal rate.  Respiratory: Effort normal.  GI: Soft. There is no tenderness. There is no rebound.  Genitourinary:  Genitourinary Comments: Dilation: Closed Effacement (%): 70 Cervical Position: Posterior Station: -3 Presentation: Vertex Exam by:: Edd ArbourJamilla Walker, SNM   Neurological: She is alert and oriented to person, place, and time.  Skin: Skin is warm and dry.  Psychiatric: She has a normal mood and affect.     NST:  Baseline: 155 Variability: moderate Accels: 15x15 Decels: none Toco: irregular    Results for orders placed or performed during the hospital encounter of 03/03/18 (from the past 24 hour(s))  Urinalysis, Routine w reflex microscopic     Status: Abnormal   Collection Time: 03/03/18  4:12 PM  Result  Value Ref Range   Color, Urine YELLOW YELLOW   APPearance CLEAR CLEAR   Specific Gravity, Urine 1.008 1.005 - 1.030   pH 8.0 5.0 - 8.0   Glucose, UA NEGATIVE NEGATIVE mg/dL   Hgb urine dipstick LARGE (A) NEGATIVE   Bilirubin Urine NEGATIVE NEGATIVE   Ketones, ur NEGATIVE NEGATIVE mg/dL   Protein, ur NEGATIVE NEGATIVE mg/dL   Nitrite NEGATIVE NEGATIVE   Leukocytes, UA NEGATIVE NEGATIVE   RBC / HPF >50 (H) 0 - 5 RBC/hpf   WBC, UA 6-10 0 - 5 WBC/hpf   Bacteria, UA RARE (A) NONE SEEN   Squamous Epithelial / LPF 0-5 0 - 5   Mucus PRESENT     MAU Course  Procedures  MDM Consult with Dr. Adrian BlackwaterStinson: Plan to admit and IOL for cholestasis/elevated liver enzymes.   Attempted to call the pharmacy to cancel rx for dilaudid that had been sent in. We had originally thought the patient was going to be DC home. The pharmacy reported that someone had already picked up the rx. When the patient was confronted about this she opened up about her history of current drug that includes 1mg  of klonopin every couple of day and a "little bit of weed" on some days.   Assessment and Plan   1. Nephrolithiasis   2. Elevated liver function tests   3. No leakage of amniotic fluid into vagina   4. Supervision of other normal pregnancy, antepartum   5. Tobacco smoking affecting pregnancy in second trimester   6. [redacted] weeks gestation of pregnancy       Admit to labor and delivery   Patty Mata 03/03/2018, 4:43 PM

## 2018-03-03 NOTE — MAU Provider Note (Signed)
History     CSN: [id number]  Arrival date and time: 03/03/18 1534   First Provider Initiated Contact with Patient 03/03/18 1636      Chief Complaint  Patient presents with  . Flank Pain   Patty Mata is a G2P1001 at 6285w3d presenting with severe flank pain r/t to her kidney stones. She also reports intermittent nausea and emesis x1 today. She ran out of her hydromorphone and has been alternating Tylenol and ibuprofen.   Past Medical History:  Diagnosis Date  . Anxiety   . Kidney stones     Past Surgical History:  Procedure Laterality Date  . STENT PLACEMENT NON-VASCULAR ([facility] HX)     renal    Family History  Problem Relation Age of Onset  . Depression Mother   . Arthritis Mother   . Asthma Father   . Mental illness Father   . Cancer Maternal Grandfather     Social History   Tobacco Use  . Smoking status: Current Every Day Smoker    Packs/day: 0.25    Years: 5.00    Pack years: 1.25    Types: Cigarettes  . Smokeless tobacco: Never Used  Substance Use Topics  . Alcohol use: Not Currently    Comment: Socially; denies today  . 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 Penicillins Rash    Has patient had a PCN reaction causing immediate rash, facial/tongue/throat swelling, SOB or lightheadedness with hypotension: Yes Has patient had a PCN reaction causing severe rash involving mucus membranes or skin necrosis: No Has patient had a PCN reaction that required hospitalization: No Has patient had a PCN reaction occurring within the last 10 years: No If all of the above answers are "NO", then may proceed with Cephalosporin use.     Medications Prior to Admission  Medication Sig Dispense Refill Last Dose  . phenazopyridine (PYRIDIUM) 200 MG tablet Take 1 tablet (200 mg total) by mouth 3 (three) times daily as needed for pain. 10 tablet 0 03/03/2018 at Unknown time  . Prenatal  Vit-Fe Fumarate-FA (PRENATAL MULTIVITAMIN) TABS tablet Take 1 tablet by mouth daily at 12 noon.   03/03/2018 at Unknown time  . tamsulosin (FLOMAX) 0.4 MG CAPS capsule Take 2 capsules (0.8 mg total) by mouth daily after breakfast. 30 capsule 0 03/03/2018 at Unknown time    Review of Systems  Constitutional: Negative.   HENT: Negative.   Eyes: Negative.   Respiratory: Negative.   Cardiovascular: Negative.   Gastrointestinal: Positive for nausea. Negative for abdominal distention, abdominal pain, constipation and diarrhea.  Endocrine: Negative.   Genitourinary: Positive for flank pain, hematuria and vaginal bleeding (possible).  Skin: Negative.   Allergic/Immunologic: Negative.   Neurological: Negative.   Hematological: Negative.   Psychiatric/Behavioral: Positive for sleep disturbance (cannot sleep due to pain).   Physical Exam   Blood pressure 132/81, pulse 77, temperature 97.7 F (36.5 C), temperature source Oral, resp. rate 18, weight 62.4 kg, last menstrual period 06/13/2017.  Physical Exam  Nursing note and vitals reviewed. Constitutional: She is oriented to person, place, and time. She appears well-developed and well-nourished. She appears distressed (pain and possibility of being in pain until her due date had her in tears).  HENT:  Head: Normocephalic.  Eyes: Pupils are equal, round, and reactive to light. Conjunctivae and EOM are normal.  Neck: Normal range of motion.  Cardiovascular: Normal rate, regular  rhythm and normal heart sounds.  Respiratory: Effort normal and breath sounds normal.  GI: Soft. Bowel sounds are normal.  Genitourinary: Vagina normal and uterus normal.  Genitourinary Comments: Dilation: Closed Effacement (%): 70 Cervical Position: Posterior Station: -3 Presentation: Vertex Exam by:: Edd Arbour, SNM   Musculoskeletal: Normal range of motion.  Neurological: She is alert and oriented to person, place, and time.  Skin: Skin is warm and dry.   Psychiatric: She has a normal mood and affect. Her behavior is normal. Judgment and thought content normal.    MAU Course  Procedures  MDM   Assessment and Plan  Nephrolithiasis - Plan: Discharge patient  Elevated liver function tests  No leakage of amniotic fluid into vagina  Supervision of other normal pregnancy, antepartum  Tobacco smoking affecting pregnancy in second trimester  [redacted] weeks gestation of pregnancy - Plan: Discharge patient    Bernerd Limbo 03/03/2018, 5:18 PM

## 2018-03-03 NOTE — MAU Note (Signed)
Pt having more pain and vomiting. Doesn't have urology appt until 12/19. Seeing some bleeding when she wipes.

## 2018-03-03 NOTE — Anesthesia Pain Management Evaluation Note (Signed)
  [facility] Pain Management Visit Note  Patient: Patty Mata, 27 y.o., female  "Hello I am a [facility] of the anesthesia team at [facility]. We have an anesthesia team available at all times to provide care throughout the hospital, including epidural management and anesthesia for C-section. I don't know your plan for the delivery whether it a natural birth, water birth, IV sedation, nitrous supplementation, doula or epidural, but we want to meet your pain goals."   1.Was your pain managed to your expectations on prior hospitalizations?   Yes   2.What is your expectation for pain management during this hospitalization?     Epidural and IV pain meds  3.How can we help you reach that goal? Be available, pain currently due to kidney stones  Record the patient's initial score and the patient's pain goal.   Pain: 5  Pain Goal: 7 [facility] wants you to be able to say your pain was always managed very well.  [facility] SystemMERRITT,Patty Mata 03/03/2018

## 2018-03-03 NOTE — H&P (Signed)
LABOR AND DELIVERY ADMISSION HISTORY AND PHYSICAL NOTE  Patty Mata is a 27 y.o. female G2P1001 with IUP at 6673w3d by US on 5/1 and a medical history significant for nephrolithiasis who presents for flank pain and IOL d/t ICP. She has been recently seen for nephrolithiasis on 11/22 and prescribed dilaudid. Today she presents with similar pain but has used her dilaudid prescription and feels tylenol and advil are poorly controlling her pain. Upon examination she was found to be in early labor and was subsequently admitted to the LD service.  She reports positive fetal movement. She denies leakage of fluid or vaginal bleeding. She endorses hematuria, which she attributes to the nephrolithiasis and describes as something she has had since initial diagnosis.   Prenatal History/Complications: PNC at [facility] Pregnancy complications:  - Nephrolithiasis  - Tobacco abuse  Past Medical History: Past Medical History:  Diagnosis Date  . Anxiety   . Kidney stones     Past Surgical History: Past Surgical History:  Procedure Laterality Date  . STENT PLACEMENT NON-VASCULAR ([facility] HX)     renal    Obstetrical History: OB History    Gravida  2   Para  1   Term  1   Preterm      AB      Living  1     SAB      TAB      Ectopic      Multiple      Live Births  1           Social History: Social History   Socioeconomic History  . Marital status: Single    Spouse name: Not on file  . Number of children: Not on file  . Years of education: Not on file  . Highest education level: Not on file  Occupational History  . Not on file  Social Needs  . Financial resource strain: Not on file  . Food insecurity:    Worry: Not on file    Inability: Not on file  . Transportation needs:    Medical: Not on file    Non-medical: Not on file  Tobacco Use  . Smoking status: Current Every Day Smoker    Packs/day: 0.25    Years: 5.00    Pack years: 1.25    Types: Cigarettes  . Smokeless  tobacco: Never Used  Substance and Sexual Activity  . Alcohol use: Not Currently    Comment: Socially; denies today  . 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 Sexual activity: Yes    Birth control/protection: None  Lifestyle  . Physical activity:    Days per week: Not on file    Minutes per session: Not on file  . Stress: Not on file  Relationships  . Social connections:    Talks on phone: Not on file    Gets together: Not on file    Attends religious service: Not on file    Active member of club or organization: Not on file    Attends meetings of clubs or organizations: Not on file    Relationship status: Not on file  Other Topics Concern  . Not on file  Social History Narrative  . Not on file    Family History: Family History  Problem Relation Age of Onset  . Depression Mother   . Arthritis Mother   . Asthma Father   .  Mental illness Father   . Cancer Maternal Grandfather     Allergies: Allergies  Allergen Reactions  . Penicillins Rash    Has patient had a PCN reaction causing immediate rash, facial/tongue/throat swelling, SOB or lightheadedness with hypotension: Yes Has patient had a PCN reaction causing severe rash involving mucus membranes or skin necrosis: No Has patient had a PCN reaction that required hospitalization: No Has patient had a PCN reaction occurring within the last 10 years: No If all of the above answers are "NO", then may proceed with Cephalosporin use.     Medications Prior to Admission  Medication Sig Dispense Refill Last Dose  . phenazopyridine (PYRIDIUM) 200 MG tablet Take 1 tablet (200 mg total) by mouth 3 (three) times daily as needed for pain. 10 tablet 0 03/03/2018 at Unknown time  . Prenatal Vit-Fe Fumarate-FA (PRENATAL MULTIVITAMIN) TABS tablet Take 1 tablet by mouth daily at 12 noon.   03/03/2018 at Unknown time  . tamsulosin (FLOMAX) 0.4 MG CAPS  capsule Take 2 capsules (0.8 mg total) by mouth daily after breakfast. 30 capsule 0 03/03/2018 at Unknown time     Review of Systems  All systems reviewed and negative except as stated in HPI  Physical Exam Blood pressure 132/81, pulse 77, temperature 97.7 F (36.5 C), temperature source Oral, resp. rate 18, height 5\' 1"  (1.549 m), weight 62.1 kg, last menstrual period 06/13/2017. General appearance: alert, oriented, NAD Lungs: normal respiratory effort Heart: regular rate Abdomen: soft, non-tender; gravid, FH appropriate for GA Extremities: No calf swelling or tenderness Presentation: cephalic by US Fetal monitoring: tolerating well  Uterine activity: widely spaced subjective contractions  Dilation: Closed Effacement (%): 70 Station: -3 Exam by:: Edd Arbour, SNM  Prenatal labs: ABO, Rh: --/--/O POS (12/03 1939) Antibody: NEG (12/03 1939) Rubella: <0.90 (08/01 1004) RPR: Non Reactive (10/17 1124)  HBsAg: Negative (11/21 1721)  HIV: Non Reactive (10/17 1124)  GC/Chlamydia: Neg/Neg (11/25) GBS:   Neg (11/25) 2-hr GTT: 72/101/75 (10/22) Genetic screening: NIPS: Low risk, female ZOX:WRUEAV neg Anatomy US: Normal female fetal anatomy   Prenatal Transfer Tool  Maternal Diabetes: No Genetic Screening: Normal Maternal Ultrasounds/Referrals: Normal Fetal Ultrasounds or other Referrals:  None Maternal Substance Abuse:  No Significant Maternal Medications:  None Significant Maternal Lab Results: None  Results for orders placed or performed during the hospital encounter of 03/03/18 (from the past 24 hour(s))  Urinalysis, Routine w reflex microscopic   Collection Time: 03/03/18  4:12 PM  Result Value Ref Range   Color, Urine YELLOW YELLOW   APPearance CLEAR CLEAR   Specific Gravity, Urine 1.008 1.005 - 1.030   pH 8.0 5.0 - 8.0   Glucose, UA NEGATIVE NEGATIVE mg/dL   Hgb urine dipstick LARGE (A) NEGATIVE   Bilirubin Urine NEGATIVE NEGATIVE   Ketones, ur NEGATIVE NEGATIVE  mg/dL   Protein, ur NEGATIVE NEGATIVE mg/dL   Nitrite NEGATIVE NEGATIVE   Leukocytes, UA NEGATIVE NEGATIVE   RBC / HPF >50 (H) 0 - 5 RBC/hpf   WBC, UA 6-10 0 - 5 WBC/hpf   Bacteria, UA RARE (A) NONE SEEN   Squamous Epithelial / LPF 0-5 0 - 5   Mucus PRESENT   CBC with Differential/Platelet   Collection Time: 03/03/18  5:27 PM  Result Value Ref Range   WBC 9.1 4.0 - 10.5 K/uL   RBC 3.71 (L) 3.87 - 5.11 MIL/uL   Hemoglobin 11.3 (L) 12.0 - 15.0 g/dL   HCT 40.9 (L) 81.1 - 91.4 %   MCV  93.8 80.0 - 100.0 fL   MCH 30.5 26.0 - 34.0 pg   MCHC 32.5 30.0 - 36.0 g/dL   RDW 16.1 09.6 - 04.5 %   Platelets 175 150 - 400 K/uL   nRBC 0.0 0.0 - 0.2 %   Neutrophils Relative % 75 %   Neutro Abs 6.9 1.7 - 7.7 K/uL   Lymphocytes Relative 19 %   Lymphs Abs 1.7 0.7 - 4.0 K/uL   Monocytes Relative 6 %   Monocytes Absolute 0.5 0.1 - 1.0 K/uL   Eosinophils Relative 0 %   Eosinophils Absolute 0.0 0.0 - 0.5 K/uL   Basophils Relative 0 %   Basophils Absolute 0.0 0.0 - 0.1 K/uL  Comprehensive metabolic panel   Collection Time: 03/03/18  5:27 PM  Result Value Ref Range   Sodium 137 135 - 145 mmol/L   Potassium 4.1 3.5 - 5.1 mmol/L   Chloride 107 98 - 111 mmol/L   CO2 22 22 - 32 mmol/L   Glucose, Bld 77 70 - 99 mg/dL   BUN 7 6 - 20 mg/dL   Creatinine, Ser 4.09 0.44 - 1.00 mg/dL   Calcium 8.7 (L) 8.9 - 10.3 mg/dL   Total Protein 6.6 6.5 - 8.1 g/dL   Albumin 2.7 (L) 3.5 - 5.0 g/dL   AST 811 (H) 15 - 41 U/L   ALT 253 (H) 0 - 44 U/L   Alkaline Phosphatase 201 (H) 38 - 126 U/L   Total Bilirubin 0.5 0.3 - 1.2 mg/dL   GFR calc non Af Amer >60 >60 mL/min   GFR calc Af Amer >60 >60 mL/min   Anion gap 8 5 - 15  CBC   Collection Time: 03/03/18  7:39 PM  Result Value Ref Range   WBC 9.9 4.0 - 10.5 K/uL   RBC 3.84 (L) 3.87 - 5.11 MIL/uL   Hemoglobin 11.7 (L) 12.0 - 15.0 g/dL   HCT 91.4 78.2 - 95.6 %   MCV 94.8 80.0 - 100.0 fL   MCH 30.5 26.0 - 34.0 pg   MCHC 32.1 30.0 - 36.0 g/dL   RDW 21.3 08.6 -  57.8 %   Platelets 179 150 - 400 K/uL   nRBC 0.0 0.0 - 0.2 %  Type and screen [facility]    Collection Time: 03/03/18  7:39 PM  Result Value Ref Range   ABO/RH(D) O POS    Antibody Screen NEG    Sample Expiration      03/06/2018 Performed at [facility], [address], [place], [place] [address]   Urine rapid drug screen (hosp performed)   Collection Time: 03/03/18  7:56 PM  Result Value Ref Range   Opiates POSITIVE (A) NONE DETECTED   Cocaine NONE DETECTED NONE DETECTED   Benzodiazepines POSITIVE (A) NONE DETECTED   Amphetamines NONE DETECTED NONE DETECTED   Tetrahydrocannabinol POSITIVE (A) NONE DETECTED   Barbiturates NONE DETECTED NONE DETECTED    Patient Active Problem List   Diagnosis Date Noted  . Cholestasis 03/03/2018  . Elevated liver function tests 02/19/2018  . No leakage of amniotic fluid into vagina 01/07/2018  . Rubella non-immune status, antepartum 11/04/2017  . Supervision of other normal pregnancy, antepartum 10/30/2017  . Tobacco smoking affecting pregnancy in second trimester 10/30/2017  . Frequent UTI 10/30/2017  . Anxiety and depression 10/30/2017    Assessment: Patty Mata is a 27 y.o. G2P1001 at [redacted]w[redacted]d here for IOL d/t ICP and elevated LFTs. She is currently receiving Cytotec q4.  Pain is mild and currently tollerable. She desires an epidural when labor intensifies.    #Labor: Intermittent subjective contractions. Continuing to monitor  #Pain: Mild, tolerable -- desires epidural with delivery   #FWB: Fetal movement seen by [place]  #ID:  GBS negative  #MOF: Bottle  #MOC: BTL, consent signed on 11/5 #Circ:  Female   Genene Churn, MS3   03/03/2018, 9:12 PM   I spoke with and examined patient and agree with resident/PA-S/MS/SNM's note and plan of care.  Patty Mata is a 27 y.o. G2P1001 at [redacted]w[redacted]d by 6wk u/s, admitted for IOL d/t ICP and elevated LFTs. Pregnancy has been complicated by nephrolithiasis and has been rx'd dilaudid,  flomax, and uripas, has appt w/ urology 12/19. She came in tonight w/ worsening pain and significantly elevated LFTs 156/253. She had bile acids of 10.4 on 11/21, at that time LFTs were 45/65. Recently ran out of dilaudid.  Some itching, but on chest, minimal on palms/soles.  O: VSS, has had 2 mildly elevated bp's (131/90, 104/92), very labile- last bp 100/56     Comfortable at this time, +fm, no uc's    UDS + opiates, benzo, THC A: [redacted]w[redacted]d pregnant w/ ICP and elevated LFTs, nephrolithiasis W/ 2 elevated bp's and elevated LFTs will also check P:C ratio. CMP normal, Cr 0.62 P: cytotec q4hr, foley bulb when able  Cheral Marker, CNM, WHNP-BC 03/03/2018 11:07 PM

## 2018-03-04 ENCOUNTER — Other Ambulatory Visit: Payer: Self-pay

## 2018-03-04 ENCOUNTER — Inpatient Hospital Stay (HOSPITAL_COMMUNITY): Payer: Medicaid Other | Admitting: Anesthesiology

## 2018-03-04 ENCOUNTER — Encounter (HOSPITAL_COMMUNITY): Payer: Self-pay | Admitting: *Deleted

## 2018-03-04 DIAGNOSIS — Z3A37 37 weeks gestation of pregnancy: Secondary | ICD-10-CM

## 2018-03-04 LAB — CBC
HCT: 33 % — ABNORMAL LOW (ref 36.0–46.0)
HCT: 34.6 % — ABNORMAL LOW (ref 36.0–46.0)
HEMOGLOBIN: 10.5 g/dL — AB (ref 12.0–15.0)
Hemoglobin: 11 g/dL — ABNORMAL LOW (ref 12.0–15.0)
MCH: 30.4 pg (ref 26.0–34.0)
MCH: 30.2 pg (ref 26.0–34.0)
MCHC: 31.8 g/dL (ref 30.0–36.0)
MCHC: 31.8 g/dL (ref 30.0–36.0)
MCV: 95.7 fL (ref 80.0–100.0)
MCV: 95.1 fL (ref 80.0–100.0)
PLATELETS: 153 10*3/uL (ref 150–400)
Platelets: 144 10*3/uL — ABNORMAL LOW (ref 150–400)
RBC: 3.45 MIL/uL — ABNORMAL LOW (ref 3.87–5.11)
RBC: 3.64 MIL/uL — ABNORMAL LOW (ref 3.87–5.11)
RDW: 12.8 % (ref 11.5–15.5)
RDW: 12.6 % (ref 11.5–15.5)
WBC: 9.1 10*3/uL (ref 4.0–10.5)
WBC: 11.2 10*3/uL — ABNORMAL HIGH (ref 4.0–10.5)
nRBC: 0 % (ref 0.0–0.2)
nRBC: 0 % (ref 0.0–0.2)

## 2018-03-04 LAB — FIBRINOGEN: FIBRINOGEN: 465 mg/dL (ref 210–475)

## 2018-03-04 LAB — COMPREHENSIVE METABOLIC PANEL
ALBUMIN: 2.4 g/dL — AB (ref 3.5–5.0)
ALT: 240 U/L — ABNORMAL HIGH (ref 0–44)
ALT: 227 U/L — AB (ref 0–44)
AST: 133 U/L — ABNORMAL HIGH (ref 15–41)
AST: 121 U/L — ABNORMAL HIGH (ref 15–41)
Albumin: 2.4 g/dL — ABNORMAL LOW (ref 3.5–5.0)
Alkaline Phosphatase: 172 U/L — ABNORMAL HIGH (ref 38–126)
Alkaline Phosphatase: 186 U/L — ABNORMAL HIGH (ref 38–126)
Anion gap: 6 (ref 5–15)
Anion gap: 6 (ref 5–15)
BUN: 6 mg/dL (ref 6–20)
BUN: 5 mg/dL — ABNORMAL LOW (ref 6–20)
CO2: 24 mmol/L (ref 22–32)
CO2: 23 mmol/L (ref 22–32)
Calcium: 8.6 mg/dL — ABNORMAL LOW (ref 8.9–10.3)
Calcium: 8.3 mg/dL — ABNORMAL LOW (ref 8.9–10.3)
Chloride: 105 mmol/L (ref 98–111)
Chloride: 105 mmol/L (ref 98–111)
Creatinine, Ser: 0.59 mg/dL (ref 0.44–1.00)
Creatinine, Ser: 0.54 mg/dL (ref 0.44–1.00)
GFR calc Af Amer: >60 mL/min (ref >60)
GFR calc Af Amer: >60 mL/min (ref >60)
GFR calc non Af Amer: >60 mL/min (ref >60)
GFR calc non Af Amer: >60 mL/min (ref >60)
GLUCOSE: 77 mg/dL (ref 70–99)
Glucose, Bld: 88 mg/dL (ref 70–99)
Potassium: 4.2 mmol/L (ref 3.5–5.1)
Potassium: 3.9 mmol/L (ref 3.5–5.1)
Sodium: 135 mmol/L (ref 135–145)
Sodium: 134 mmol/L — ABNORMAL LOW (ref 135–145)
TOTAL PROTEIN: 5.9 g/dL — AB (ref 6.5–8.1)
Total Bilirubin: 0.6 mg/dL (ref 0.3–1.2)
Total Bilirubin: 0.3 mg/dL (ref 0.3–1.2)
Total Protein: 5.8 g/dL — ABNORMAL LOW (ref 6.5–8.1)

## 2018-03-04 LAB — PROTEIN / CREATININE RATIO, URINE
Creatinine, Urine: 127 mg/dL
Protein Creatinine Ratio: 0.2 mg/mg{Cre} — ABNORMAL HIGH (ref 0.00–0.15)
Total Protein, Urine: 25 mg/dL

## 2018-03-04 LAB — APTT: aPTT: 22 seconds — ABNORMAL LOW (ref 24–36)

## 2018-03-04 LAB — PROTIME-INR
INR: 0.83
Prothrombin Time: 11.3 seconds — ABNORMAL LOW (ref 11.4–15.2)

## 2018-03-04 LAB — BILE ACIDS, TOTAL: BILE ACIDS TOTAL: 51.1 umol/L — AB (ref 0.0–10.0)

## 2018-03-04 LAB — SAVE SMEAR(SSMR), FOR PROVIDER SLIDE REVIEW

## 2018-03-04 LAB — LACTATE DEHYDROGENASE
LDH: 160 U/L (ref 98–192)
LDH: 178 U/L (ref 98–192)

## 2018-03-04 LAB — RPR: RPR Ser Ql: NONREACTIVE

## 2018-03-04 MED ORDER — MAGNESIUM SULFATE BOLUS VIA INFUSION
4.0000 g | Freq: Once | INTRAVENOUS | Status: AC
  Administered 2018-03-04: 4 g via INTRAVENOUS
  Filled 2018-03-04: qty 500

## 2018-03-04 MED ORDER — LABETALOL HCL 5 MG/ML IV SOLN: 20.0000 mg | INTRAVENOUS | Status: DC | PRN

## 2018-03-04 MED ORDER — MISOPROSTOL 200 MCG PO TABS
800.0000 ug | ORAL_TABLET | Freq: Once | ORAL | Status: AC
  Administered 2018-03-04: 800 ug via RECTAL

## 2018-03-04 MED ORDER — OXYCODONE HCL 5 MG PO TABS
5.0000 mg | ORAL_TABLET | ORAL | Status: DC | PRN
  Administered 2018-03-04 (×2): 5 mg via ORAL
  Filled 2018-03-04 (×2): qty 1

## 2018-03-04 MED ORDER — MISOPROSTOL 200 MCG PO TABS
ORAL_TABLET | ORAL | Status: AC
  Filled 2018-03-04: qty 4

## 2018-03-04 MED ORDER — LABETALOL HCL 5 MG/ML IV SOLN: 80.0000 mg | INTRAVENOUS | Status: DC | PRN

## 2018-03-04 MED ORDER — PRENATAL MULTIVITAMIN CH: 1.0000 | ORAL_TABLET | Freq: Every day | ORAL | Status: DC

## 2018-03-04 MED ORDER — MISOPROSTOL 25 MCG QUARTER TABLET
25.0000 ug | ORAL_TABLET | ORAL | Status: DC
  Administered 2018-03-04: 25 ug via VAGINAL
  Filled 2018-03-04: qty 1

## 2018-03-04 MED ORDER — OXYTOCIN 40 UNITS IN LACTATED RINGERS INFUSION - SIMPLE MED: 2.5000 [IU]/h | INTRAVENOUS | Status: DC | PRN

## 2018-03-04 MED ORDER — CEFAZOLIN SODIUM-DEXTROSE 2-4 GM/100ML-% IV SOLN: 2.0000 g | Freq: Once | INTRAVENOUS | Status: DC

## 2018-03-04 MED ORDER — LACTATED RINGERS IV SOLN
500.0000 mL | Freq: Once | INTRAVENOUS | Status: AC
  Administered 2018-03-04: 500 mL via INTRAVENOUS

## 2018-03-04 MED ORDER — LIDOCAINE HCL (PF) 1 % IJ SOLN
INTRAMUSCULAR | Status: DC | PRN
  Administered 2018-03-04: 8 mL via EPIDURAL

## 2018-03-04 MED ORDER — DIBUCAINE 1 % RE OINT: 1.0000 "application " | TOPICAL_OINTMENT | RECTAL | Status: DC | PRN

## 2018-03-04 MED ORDER — HYDROMORPHONE HCL 1 MG/ML IJ SOLN
0.5000 mg | INTRAMUSCULAR | Status: DC | PRN
  Administered 2018-03-04 (×2): 0.5 mg via INTRAVENOUS
  Filled 2018-03-04 (×2): qty 1

## 2018-03-04 MED ORDER — MAGNESIUM SULFATE 40 G IN LACTATED RINGERS - SIMPLE
2.0000 g/h | INTRAVENOUS | Status: DC
  Administered 2018-03-04: 2 g/h via INTRAVENOUS
  Filled 2018-03-04: qty 500

## 2018-03-04 MED ORDER — DIPHENHYDRAMINE HCL 25 MG PO CAPS: 25.0000 mg | ORAL_CAPSULE | Freq: Four times a day (QID) | ORAL | Status: DC | PRN

## 2018-03-04 MED ORDER — HYDRALAZINE HCL 20 MG/ML IJ SOLN: 10.0000 mg | INTRAMUSCULAR | Status: DC | PRN

## 2018-03-04 MED ORDER — METHYLERGONOVINE MALEATE 0.2 MG/ML IJ SOLN: 0.2000 mg | Freq: Once | INTRAMUSCULAR | Status: DC

## 2018-03-04 MED ORDER — LABETALOL HCL 5 MG/ML IV SOLN: 40.0000 mg | INTRAVENOUS | Status: DC | PRN

## 2018-03-04 MED ORDER — METHYLERGONOVINE MALEATE 0.2 MG/ML IJ SOLN
INTRAMUSCULAR | Status: AC
  Administered 2018-03-04: 0.2 mg
  Filled 2018-03-04: qty 1

## 2018-03-04 MED ORDER — DIPHENHYDRAMINE HCL 50 MG/ML IJ SOLN: 12.5000 mg | INTRAMUSCULAR | Status: DC | PRN

## 2018-03-04 MED ORDER — FENTANYL 2.5 MCG/ML BUPIVACAINE 1/10 % EPIDURAL INFUSION (WH - ANES)
14.0000 mL/h | INTRAMUSCULAR | Status: DC | PRN
  Administered 2018-03-04: 14 mL/h via EPIDURAL
  Filled 2018-03-04: qty 100

## 2018-03-04 MED ORDER — SIMETHICONE 80 MG PO CHEW: 80.0000 mg | CHEWABLE_TABLET | ORAL | Status: DC | PRN

## 2018-03-04 MED ORDER — EPHEDRINE 5 MG/ML INJ
10.0000 mg | INTRAVENOUS | Status: DC | PRN
  Filled 2018-03-04: qty 2

## 2018-03-04 MED ORDER — IBUPROFEN 600 MG PO TABS
600.0000 mg | ORAL_TABLET | Freq: Four times a day (QID) | ORAL | Status: DC
  Administered 2018-03-05 – 2018-03-07 (×11): 600 mg via ORAL
  Filled 2018-03-04 (×11): qty 1

## 2018-03-04 MED ORDER — PRENATAL MULTIVITAMIN CH
1.0000 | ORAL_TABLET | Freq: Every day | ORAL | Status: DC
  Administered 2018-03-05 – 2018-03-07 (×3): 1 via ORAL
  Filled 2018-03-04 (×3): qty 1

## 2018-03-04 MED ORDER — SENNOSIDES-DOCUSATE SODIUM 8.6-50 MG PO TABS
2.0000 | ORAL_TABLET | ORAL | Status: DC
  Administered 2018-03-05 – 2018-03-07 (×3): 2 via ORAL
  Filled 2018-03-04 (×3): qty 2

## 2018-03-04 MED ORDER — MAGNESIUM SULFATE 40 G IN LACTATED RINGERS - SIMPLE
2.0000 g/h | INTRAVENOUS | Status: AC
  Administered 2018-03-05: 2 g/h via INTRAVENOUS
  Filled 2018-03-04 (×2): qty 500

## 2018-03-04 MED ORDER — ZOLPIDEM TARTRATE 5 MG PO TABS
5.0000 mg | ORAL_TABLET | Freq: Every evening | ORAL | Status: DC | PRN
  Administered 2018-03-06: 5 mg via ORAL
  Filled 2018-03-04: qty 1

## 2018-03-04 MED ORDER — WITCH HAZEL-GLYCERIN EX PADS: 1.0000 "application " | MEDICATED_PAD | CUTANEOUS | Status: DC | PRN

## 2018-03-04 MED ORDER — TAMSULOSIN HCL 0.4 MG PO CAPS
0.8000 mg | ORAL_CAPSULE | Freq: Every day | ORAL | Status: DC
  Administered 2018-03-05 – 2018-03-07 (×3): 0.8 mg via ORAL
  Filled 2018-03-04 (×6): qty 2

## 2018-03-04 MED ORDER — OXYCODONE HCL 5 MG PO TABS
10.0000 mg | ORAL_TABLET | ORAL | Status: DC | PRN
  Administered 2018-03-04 – 2018-03-07 (×7): 10 mg via ORAL
  Filled 2018-03-04 (×8): qty 2

## 2018-03-04 MED ORDER — PHENYLEPHRINE 40 MCG/ML (10ML) SYRINGE FOR IV PUSH (FOR BLOOD PRESSURE SUPPORT)
80.0000 ug | PREFILLED_SYRINGE | INTRAVENOUS | Status: DC | PRN
  Filled 2018-03-04: qty 5

## 2018-03-04 MED ORDER — PHENAZOPYRIDINE HCL 200 MG PO TABS
200.0000 mg | ORAL_TABLET | Freq: Three times a day (TID) | ORAL | Status: DC | PRN
  Filled 2018-03-04: qty 1

## 2018-03-04 MED ORDER — PHENYLEPHRINE 40 MCG/ML (10ML) SYRINGE FOR IV PUSH (FOR BLOOD PRESSURE SUPPORT)
80.0000 ug | PREFILLED_SYRINGE | INTRAVENOUS | Status: DC | PRN
  Filled 2018-03-04: qty 5
  Filled 2018-03-04: qty 10

## 2018-03-04 MED ORDER — ONDANSETRON HCL 4 MG/2ML IJ SOLN
4.0000 mg | INTRAMUSCULAR | Status: DC | PRN
  Administered 2018-03-06: 4 mg via INTRAVENOUS

## 2018-03-04 MED ORDER — COCONUT OIL OIL
1.0000 "application " | TOPICAL_OIL | Status: DC | PRN
  Administered 2018-03-05: 1 via TOPICAL
  Filled 2018-03-04: qty 120

## 2018-03-04 MED ORDER — ACETAMINOPHEN 325 MG PO TABS
650.0000 mg | ORAL_TABLET | ORAL | Status: DC | PRN
  Administered 2018-03-05 – 2018-03-06 (×2): 650 mg via ORAL
  Filled 2018-03-04 (×3): qty 2

## 2018-03-04 MED ORDER — ONDANSETRON HCL 4 MG PO TABS: 4.0000 mg | ORAL_TABLET | ORAL | Status: DC | PRN

## 2018-03-04 MED ORDER — BENZOCAINE-MENTHOL 20-0.5 % EX AERO
1.0000 "application " | INHALATION_SPRAY | CUTANEOUS | Status: DC | PRN
  Administered 2018-03-05: 1 via TOPICAL
  Filled 2018-03-04: qty 56

## 2018-03-04 NOTE — Progress Notes (Signed)
Patient ID: Patty Mata, female   DOB: 12/14/1990, 27 y.o.   MRN: [id number] Patty Mata is a 27 y.o. G2P1001 at 8057w4d admitted for induction of labor due to ICP, nephrolithiasis.  Subjective: Doing well, has been getting dilaudid iv prn for flank/kidney stone pain, starting to feel uc's  Objective: BP (!) 110/97   Pulse (!) 52   Temp 98.6 F (37 C) (Oral)   Resp 18   Ht 5\' 1"  (1.549 m)   Wt 62.1 kg   LMP 06/13/2017 (Approximate)   BMI 25.89 kg/m  No intake/output data recorded.  FHT:  FHR: 145 bpm, variability: moderate,  accelerations:  Present,  decelerations:  Present occ variable UC:   irregular  SVE:   Dilation: 2 Effacement (%): 70 Station: -2 Exam by:: kbooker, cnm  Cervical foley bulb inserted and inflated w/ 60ml LR w/o difficulty   Labs: Lab Results  Component Value Date   WBC 9.9 03/03/2018   HGB 11.7 (L) 03/03/2018   HCT 36.4 03/03/2018   MCV 94.8 03/03/2018   PLT 179 03/03/2018    Assessment / Plan: IOL d/t ICP, nephrolithiasis. S/P cytotec x 2 buccal, cervical foley bulb now in, will continue cytotec q4hr vaginally  Labor: cervical ripening phase Fetal Wellbeing:  Category II Pain Control:  IV pain meds Pre-eclampsia: n/a I/D:  n/a Anticipated MOD:  NSVD  Cheral MarkerKimberly R Undrea Shipes CNM, WHNP-BC 03/04/2018, 5:57 AM

## 2018-03-04 NOTE — Progress Notes (Signed)
Patient ID: Patty LewandowskyKeely M Mata, female   DOB: 06/04/1990, 27 y.o.   MRN: [id number] Patty Mata is a 27 y.o. G2P1001 at 7757w4d admitted for induction of labor due to ICP, kidney stones.  Subjective: Doing well  Objective: BP 110/68   Pulse 63   Temp 98.1 F (36.7 C)   Resp 16   Ht 5\' 1"  (1.549 m)   Wt 62.1 kg   LMP 06/13/2017 (Approximate)   BMI 25.89 kg/m  No intake/output data recorded.  FHT:  FHR: 150 bpm, variability: moderate,  accelerations:  Present,  decelerations:  Absent UC:   q 6-547mins  SVE:   Dilation: 2 Effacement (%): 70 Station: -3 Exam by:: Neal DyB. Bumgarner, RN  Labs: Lab Results  Component Value Date   WBC 9.9 03/03/2018   HGB 11.7 (L) 03/03/2018   HCT 36.4 03/03/2018   MCV 94.8 03/03/2018   PLT 179 03/03/2018    Assessment / Plan: IOL d/t ICP, s/p cytotec x 1, cx now 2cm, will repeat cytotec x 1  Labor: Progressing normally Fetal Wellbeing:  Category I Pain Control:  IV pain meds Pre-eclampsia: n/a I/D:  n/a Anticipated MOD:  NSVD  Cheral MarkerKimberly R Jamille Fisher CNM, WHNP-BC 03/04/2018, 0130

## 2018-03-04 NOTE — Progress Notes (Signed)
OB/GYN Faculty Practice: Labor Progress Note  Subjective: Doing well, comfortable with epidural. Partner, Patty Mata, in room. Dr. Ashok PallWouk in room on arrival discussing LFTs. Patient is nervous about results. Still complaining of itching - mostly stomach and back, not much on hands/soles.   Objective: BP 113/74   Pulse 75   Temp 98.6 F (37 C) (Oral)   Resp (P) 20   Ht 5\' 1"  (1.549 m)   Wt 62.1 kg   LMP 06/13/2017 (Approximate)   BMI 25.89 kg/m  Gen: well-appearing, lying on left side  Dilation: 2 Effacement (%): 70 Cervical Position: Posterior Station: -2 Presentation: Vertex Exam by:: B. Bumgarner, RN  Assessment and Plan: 27 y.o. G2P1001 807w4d here for IOL for presumed cholestasis of pregnancy and elevated LFTs.  Labor: Induction started overnight with buccal cytotec. FB placed at 0545 and now out. Last dose of vaginal cytotec at 0615.  -- plan to start pitocin around 1015, consider AROM -- pain control: epidural in place -- PPH Risk: low  Fetal Well-Being: EFW 5-6lbs by [place]. Cephalic by prior checks.  -- Category I - continuous fetal monitoring  -- GBS negative  Pruritus  Bile Acids (10.4) 11/21  Elevated LFTs: Most likely cholestasis of pregnancy though atypical distribution of symptoms. No evidence of preE, HELLP - BP has been normal, denies abdominal pain and headaches, platelets wnl. History of IV drug abuse so certainly at risk for hepatitis C. Will repeat acute hepatitis panel - though less likely given recently normal and no N/V, jaundice.  -- f/u repeat bile acids -- continue to trend LFTs, CBC -- repeat acute hepatitis panel  Patty Canty S. Earlene PlaterWallace, DO OB/GYN Fellow, Faculty Practice  9:49 AM

## 2018-03-04 NOTE — Anesthesia Preprocedure Evaluation (Addendum)
Anesthesia Evaluation  Patient identified by MRN, date of birth, ID band Patient awake    Reviewed: Allergy & Precautions, H&P , NPO status , Patient's Chart, lab work & pertinent test results, reviewed documented beta blocker date and time   History of Anesthesia Complications (+) history of anesthetic complications  Airway Mallampati: I  TM Distance: >3 FB Neck ROM: full    Dental no notable dental hx.    Pulmonary neg pulmonary ROS, Current Smoker,    Pulmonary exam normal breath sounds clear to auscultation       Cardiovascular negative cardio ROS Normal cardiovascular exam Rhythm:regular Rate:Normal     Neuro/Psych negative neurological ROS  negative psych ROS   GI/Hepatic negative GI ROS, (+)     substance abuse  ,   Endo/Other  negative endocrine ROS  Renal/GU negative Renal ROS  negative genitourinary   Musculoskeletal   Abdominal   Peds  Hematology negative hematology ROS (+)   Anesthesia Other Findings   Reproductive/Obstetrics (+) Pregnancy                            Anesthesia Physical Anesthesia Plan  ASA: II  Anesthesia Plan: Epidural   Post-op Pain Management:    Induction:   PONV Risk Score and Plan:   Airway Management Planned:   Additional Equipment:   Intra-op Plan:   Post-operative Plan:   Informed Consent: I have reviewed the patients History and Physical, chart, labs and discussed the procedure including the risks, benefits and alternatives for the proposed anesthesia with the patient or authorized representative who has indicated his/her understanding and acceptance.     Plan Discussed with:   Anesthesia Plan Comments:         Anesthesia Quick Evaluation

## 2018-03-04 NOTE — Anesthesia Procedure Notes (Signed)
Epidural Patient location during procedure: OB Start time: 03/04/2018 8:25 AM  Staffing Anesthesiologist: Bethena Midgetddono, Deshana Rominger, MD  Preanesthetic Checklist Completed: patient identified, site marked, surgical consent, pre-op evaluation, timeout performed, IV checked, risks and benefits discussed and monitors and equipment checked  Epidural Patient position: sitting Prep: site prepped and draped and DuraPrep Patient monitoring: continuous pulse ox and blood pressure Approach: midline Location: L3-L4 Injection technique: LOR air  Needle:  Needle type: Tuohy  Needle gauge: 17 G Needle length: 9 cm and 9 Needle insertion depth: 5 cm cm Catheter type: closed end flexible Catheter size: 19 Gauge Catheter at skin depth: 10 cm Test dose: negative  Assessment Events: blood not aspirated, injection not painful, no injection resistance, negative IV test and no paresthesia

## 2018-03-04 NOTE — Progress Notes (Signed)
Interval progress note. Called to room because FHR w/occasional late decelerations. Trying positioning, will give bolus. Excellent variability. SVE now 5-6/70/-2, no obvious bag of water. Great response to fetal scalp stim. Will plan to start pitocin when able and place IUPC/FSE.   [name], DO OB/GYN Fellow

## 2018-03-05 ENCOUNTER — Encounter: Payer: Medicaid Other | Admitting: Obstetrics and Gynecology

## 2018-03-05 LAB — COMPREHENSIVE METABOLIC PANEL
ALBUMIN: 2.1 g/dL — AB (ref 3.5–5.0)
ALT: 171 U/L — AB (ref 0–44)
AST: 78 U/L — AB (ref 15–41)
Alkaline Phosphatase: 164 U/L — ABNORMAL HIGH (ref 38–126)
Anion gap: 6 (ref 5–15)
BUN: <5 mg/dL — ABNORMAL LOW (ref 6–20)
CO2: 25 mmol/L (ref 22–32)
CREATININE: 0.53 mg/dL (ref 0.44–1.00)
Calcium: 7 mg/dL — ABNORMAL LOW (ref 8.9–10.3)
Chloride: 103 mmol/L (ref 98–111)
GFR calc Af Amer: >60 mL/min (ref >60)
GFR calc non Af Amer: >60 mL/min (ref >60)
Glucose, Bld: 127 mg/dL — ABNORMAL HIGH (ref 70–99)
Potassium: 3.8 mmol/L (ref 3.5–5.1)
Sodium: 134 mmol/L — ABNORMAL LOW (ref 135–145)
Total Bilirubin: 0.2 mg/dL — ABNORMAL LOW (ref 0.3–1.2)
Total Protein: 5.3 g/dL — ABNORMAL LOW (ref 6.5–8.1)

## 2018-03-05 LAB — CBC
HCT: 29 % — ABNORMAL LOW (ref 36.0–46.0)
Hemoglobin: 9.6 g/dL — ABNORMAL LOW (ref 12.0–15.0)
MCH: 30.6 pg (ref 26.0–34.0)
MCHC: 33.1 g/dL (ref 30.0–36.0)
MCV: 92.4 fL (ref 80.0–100.0)
NRBC: 0 % (ref 0.0–0.2)
Platelets: 121 10*3/uL — ABNORMAL LOW (ref 150–400)
RBC: 3.14 MIL/uL — ABNORMAL LOW (ref 3.87–5.11)
RDW: 12.8 % (ref 11.5–15.5)
WBC: 11.4 10*3/uL — AB (ref 4.0–10.5)

## 2018-03-05 LAB — HEPATITIS PANEL, ACUTE
Hep A IgM: NEGATIVE
Hep B C IgM: NEGATIVE
Hepatitis B Surface Ag: NEGATIVE

## 2018-03-05 LAB — MRSA PCR SCREENING: MRSA by PCR: NEGATIVE

## 2018-03-05 MED ORDER — HYDROMORPHONE HCL 1 MG/ML IJ SOLN
1.0000 mg | INTRAMUSCULAR | Status: DC | PRN
  Administered 2018-03-05 – 2018-03-07 (×7): 1 mg via INTRAVENOUS
  Filled 2018-03-05 (×8): qty 1

## 2018-03-05 NOTE — Progress Notes (Signed)
CSW acknowledges consult.  CSW attempted to meet with MOB, however MOB had several room guest. MOB requested that CSW returns at a later time. CSW will attempt to visit with MOB tomorrow morning.    Blaine HamperAngel Boyd-Gilyard, MSW, LCSW Clinical Social Work [phone]

## 2018-03-05 NOTE — Progress Notes (Signed)
CSW made [place] CPS report for positive UDS for MOB.  CPS will follow-up with CSW prior to infant's discharge.   CSW will complete a clinical assessment with MOB on 03/06/18  Haruto Demaria Boyd-Gilyard, MSW, LCSW Clinical Social Work [phone]   

## 2018-03-05 NOTE — Progress Notes (Signed)
Faculty Attending Note  Post Partum Day 1  Subjective: Patient is feeling "like crap". States she is having significant back pain from her kidney stones, also states the Magnesium is making her feel terrible. She is ambulating and denies light-headedness or dizziness. She is passing flatus. She is tolerating a regular diet without nausea/vomiting. Bleeding is moderate. She is breast feeding. Baby is in nursery and doing well.  Objective: Blood pressure 120/73, pulse (!) 58, temperature 97.7 F (36.5 C), temperature source Oral, resp. rate 18, height 5\' 1"  (1.549 m), weight 62.1 kg, last menstrual period 06/13/2017, SpO2 100 %, unknown if currently breastfeeding. Temp:  [97.7 F (36.5 C)-99.1 F (37.3 C)] 97.7 F (36.5 C) (12/05 0801) Pulse Rate:  [50-127] 58 (12/05 0801) Resp:  [16-20] 18 (12/05 0801) BP: (108-156)/(60-99) 120/73 (12/05 0801) SpO2:  [98 %-100 %] 100 % (12/05 0801)  Physical Exam:  General: alert, oriented, cooperative, appears fatigued Chest: CTAB, normal respiratory effort Heart: RRR  Abdomen: +BS, soft, appropriately tender to palpation  Uterine Fundus: firm, 2 fingers below the umbilicus Lochia: moderate, rubra DVT Evaluation: no evidence of DVT Extremities: no edema, no calf tenderness   Current Facility-Administered Medications:  .  acetaminophen (TYLENOL) tablet 650 mg, 650 mg, Oral, Q4H PRN, Peiffer, Sarah A, MD .  benzocaine-Menthol (DERMOPLAST) 20-0.5 % topical spray 1 application, 1 application, Topical, PRN, Peiffer, Sarah A, MD .  ceFAZolin (ANCEF) IVPB 2g/100 mL premix, 2 g, Intravenous, Once, Peiffer, Sarah A, MD .  coconut oil, 1 application, Topical, PRN, Peiffer, Shawn RouteSarah A, MD .  witch hazel-glycerin (TUCKS) pad 1 application, 1 application, Topical, PRN **AND** dibucaine (NUPERCAINAL) 1 % rectal ointment 1 application, 1 application, Rectal, PRN, Peiffer, Sarah A, MD .  diphenhydrAMINE (BENADRYL) capsule 25 mg, 25 mg, Oral, Q6H PRN, Peiffer, Sarah  A, MD .  labetalol (NORMODYNE,TRANDATE) injection 20 mg, 20 mg, Intravenous, PRN **AND** labetalol (NORMODYNE,TRANDATE) injection 40 mg, 40 mg, Intravenous, PRN **AND** labetalol (NORMODYNE,TRANDATE) injection 80 mg, 80 mg, Intravenous, PRN **AND** hydrALAZINE (APRESOLINE) injection 10 mg, 10 mg, Intravenous, PRN **AND** Measure blood pressure, , , Once, Peiffer, Sarah A, MD .  HYDROmorphone (DILAUDID) injection 1 mg, 1 mg, Intravenous, Q3H PRN, Adam PhenixArnold, James G, MD, 1 mg at 03/05/18 0815 .  ibuprofen (ADVIL,MOTRIN) tablet 600 mg, 600 mg, Oral, Q6H, Peiffer, Shawn RouteSarah A, MD, 600 mg at 03/05/18 0540 .  magnesium sulfate 40 grams in LR 500 mL OB infusion, 2 g/hr, Intravenous, Continuous, Peiffer, Shawn RouteSarah A, MD, Last Rate: 25 mL/hr at 03/05/18 0600, 2 g/hr at 03/05/18 0600 .  methylergonovine (METHERGINE) injection 0.2 mg, 0.2 mg, Intramuscular, Once, Peiffer, Sarah A, MD .  ondansetron (ZOFRAN) tablet 4 mg, 4 mg, Oral, Q4H PRN **OR** ondansetron (ZOFRAN) injection 4 mg, 4 mg, Intravenous, Q4H PRN, Peiffer, Sarah A, MD .  oxyCODONE (Oxy IR/ROXICODONE) immediate release tablet 10 mg, 10 mg, Oral, Q4H PRN, Conan Bowensavis, Izaha Shughart M, MD, 10 mg at 03/05/18 0143 .  oxyCODONE (Oxy IR/ROXICODONE) immediate release tablet 5 mg, 5 mg, Oral, Q4H PRN, Conan Bowensavis, Keidy Thurgood M, MD, 5 mg at 03/04/18 1652 .  oxytocin (PITOCIN) IV infusion 40 units in LR 1000 mL - Premix, 2.5 Units/hr, Intravenous, Continuous PRN, Peiffer, Sarah A, MD .  phenazopyridine (PYRIDIUM) tablet 200 mg, 200 mg, Oral, TID PRN, Peiffer, Shawn RouteSarah A, MD .  prenatal multivitamin tablet 1 tablet, 1 tablet, Oral, Q1200, Peiffer, Shawn RouteSarah A, MD .  senna-docusate (Senokot-S) tablet 2 tablet, 2 tablet, Oral, Q24H, Peiffer, Shawn RouteSarah A, MD, 2 tablet at 03/05/18  0000 .  simethicone (MYLICON) chewable tablet 80 mg, 80 mg, Oral, PRN, Peiffer, Sarah A, MD .  tamsulosin (FLOMAX) capsule 0.8 mg, 0.8 mg, Oral, QPC breakfast, Peiffer, Sarah A, MD .  zolpidem (AMBIEN) tablet 5 mg, 5 mg, Oral, QHS  PRN, Peiffer, Shawn Route, MD  Facility-Administered Medications Ordered in Other Encounters:  .  lidocaine (PF) (XYLOCAINE) 1 % injection, , , Anesthesia Intra-op, Bethena Midget, MD, 8 mL at 03/04/18 0825 Recent Labs    03/04/18 1305 03/05/18 0538  HGB 11.0* 9.6*  HCT 34.6* 29.0*    Assessment/Plan:  Patient is 27 y.o. [id number] PPD#1 s/p SVD at [redacted]w[redacted]d, complicated by preeclampsia with severe features, PPH, s/p IOL for cholestasis and kidney stones. She is doing okay, pain not well controlled. MgSO4 to come off this afternoon.  MgSO4 until 3 pm Prn pain meds for kidney stones Continue routine post partum care Regular diet For PPTL tomorrow, NPO at midnight    Baldemar Lenis, M.D. Attending Center for [employer] (Faculty Practice)  03/05/2018, 8:37 AM

## 2018-03-05 NOTE — Lactation Note (Signed)
This note was copied from a baby's chart. Lactation Consultation Note  Patient Name: Patty Mata ONGEX'BToday's Date: 03/05/2018 Reason for consult: Initial assessment;Early term 37-38.6wks;Other (Comment)(mother is on magnesium)  P2 mother whose infant is now 2621 hours old.  This is an ETI at 37+4 weeks.  Mother remains on magnesium.  This will be discontinued at 1500 today.  Mother breast fed her 27 year old for 1 month.  Mother was very sleepy and had baby at the breast when I arrived.  She stated that she had been breast feeding for 45 minutes.  Baby did not appear to be latched well and she was not content at the breast.  I offered to assist and mother accepted.  Mother's breasts are soft with minimal breast tissue.  Her nipples are everted and no breakdown noted.  Assisted baby to latch in the cross cradle hold on the left breast.  Baby was restless and was not able to suck due to irritability at the breast.  Attempted to burp her and latch again with the same results.  Mother was somewhat frustrated, tired and had some difficulty focusing on feeding.  I suggested the football hold on the right breast and she was willing to try.  Again, baby remained restless and would not even latch.  Demonstrated and taught mother hand expression.  She was able to express 3 mls on a spoon and I finger fed this baby to baby.  She became a little bit more calm but was still showing feeding cues.    Initiated the DEBP to help increase milk supply for supplementation.  Reviewed pump parts, assembly, disassembly and cleaning.  Mother remained drowsy but interested in trying to learn.  She stated that she wanted to "breast feed only."  While she pumped I educated her on breast feeding basics, milk coming to volume, feeding cues, ETI and supplementing and hand expression review.  She had some difficulty focusing and I had to repeat some concepts for reinforcement.    Mother was originally scheduled for a BTL this morning  and this had to be canceled since she remains on magnesium.  It has been rescheduled for 1000 tomorrow morning.  Assisted mother to obtain food and drink and she will be placing her breakfast order after pumping.  Mom made aware of O/P services, breastfeeding support groups, community resources, and our phone # for post-discharge questions.   Mother is alone in her room now but expecting her boyfriend to arrive about 1000.  RN updated.  Mother will call for latch assistance as needed and will continue to post pump after feeding every 3 hours or sooner if baby shows feeding cues.     Maternal Data Formula Feeding for Exclusion: No Has patient been taught Hand Expression?: Yes Does the patient have breastfeeding experience prior to this delivery?: Yes  Feeding Feeding Type: Breast Fed  LATCH Score Latch: Repeated attempts needed to sustain latch, nipple held in mouth throughout feeding, stimulation needed to elicit sucking reflex.  Audible Swallowing: None  Type of Nipple: Everted at rest and after stimulation  Comfort (Breast/Nipple): Soft / non-tender  Hold (Positioning): Assistance needed to correctly position infant at breast and maintain latch.  LATCH Score: 6  Interventions Interventions: Breast feeding basics reviewed;Assisted with latch;Skin to skin;Breast massage;Hand express;Breast compression;Adjust position;DEBP;Expressed milk;Position options;Support pillows  Lactation Tools Discussed/Used WIC Program[place] [facility] [place] [place] Pump Review: Setup, frequency, and cleaning;Milk Storage Initiated by:: Yidel Teuscher Date initiated:: 03/05/18  Consult Status Consult Status: Follow-up Date: 03/06/18 Follow-up type: In-patient    Liddie Chichester R Margareta Laureano 03/05/2018, 9:14 AM

## 2018-03-05 NOTE — Anesthesia Postprocedure Evaluation (Signed)
Anesthesia Post Note  Patient: Patty LewandowskyKeely M Cullifer  Procedure(s) Performed: AN AD HOC LABOR EPIDURAL     Patient location during evaluation: [facility] Anesthesia Type: Epidural Level of consciousness: awake Pain management: pain level controlled Vital Signs Assessment: post-procedure vital signs reviewed and stable Respiratory status: spontaneous breathing Cardiovascular status: stable Postop Assessment: patient able to bend at knees, epidural receding, no backache and no headache Anesthetic complications: no    Last Vitals:  Vitals:   03/05/18 0542 03/05/18 0600  BP: 118/76   Pulse: 60   Resp: 16 18  Temp: 36.6 C   SpO2: 100%     Last Pain:  Vitals:   03/05/18 0600  TempSrc:   PainSc: 5    Pain Goal: Patients Stated Pain Goal: 4 (03/05/18 0600)               Edison PaceWILKERSON,Brent Noto

## 2018-03-06 ENCOUNTER — Encounter (HOSPITAL_COMMUNITY): Payer: Self-pay | Admitting: Obstetrics & Gynecology

## 2018-03-06 ENCOUNTER — Inpatient Hospital Stay (HOSPITAL_COMMUNITY): Payer: Medicaid Other

## 2018-03-06 ENCOUNTER — Encounter (HOSPITAL_COMMUNITY)
Admission: AD | Disposition: A | Discharge status: Home / Self Care | Payer: Self-pay | Source: Home / Self Care | Attending: Obstetrics and Gynecology

## 2018-03-06 DIAGNOSIS — Z302 Encounter for sterilization: Secondary | ICD-10-CM

## 2018-03-06 HISTORY — PX: TUBAL LIGATION: SHX77

## 2018-03-06 SURGERY — LIGATION, FALLOPIAN TUBE, POSTPARTUM
Anesthesia: Epidural | Site: Abdomen | Laterality: Bilateral | Wound class: Clean Contaminated

## 2018-03-06 MED ORDER — MIDAZOLAM HCL 5 MG/5ML IJ SOLN
INTRAMUSCULAR | Status: DC | PRN
  Administered 2018-03-06 (×4): 1 mg via INTRAVENOUS

## 2018-03-06 MED ORDER — FENTANYL CITRATE (PF) 100 MCG/2ML IJ SOLN: INTRAMUSCULAR | Status: DC | PRN

## 2018-03-06 MED ORDER — HYDROMORPHONE HCL 1 MG/ML IJ SOLN
0.2500 mg | INTRAMUSCULAR | Status: DC | PRN
  Administered 2018-03-06: 0.25 mg via INTRAVENOUS
  Administered 2018-03-06: 0.5 mg via INTRAVENOUS
  Administered 2018-03-06: 0.25 mg via INTRAVENOUS

## 2018-03-06 MED ORDER — HYDROCODONE-ACETAMINOPHEN 7.5-325 MG PO TABS: 1.0000 | ORAL_TABLET | Freq: Once | ORAL | Status: DC | PRN

## 2018-03-06 MED ORDER — BUPIVACAINE HCL (PF) 0.5 % IJ SOLN
INTRAMUSCULAR | Status: AC
  Filled 2018-03-06: qty 30

## 2018-03-06 MED ORDER — HYDROMORPHONE HCL 2 MG/ML IJ SOLN
2.0000 mg | Freq: Once | INTRAMUSCULAR | Status: AC
  Administered 2018-03-06: 2 mg via INTRAVENOUS
  Filled 2018-03-06: qty 1

## 2018-03-06 MED ORDER — LIDOCAINE-EPINEPHRINE (PF) 2 %-1:200000 IJ SOLN
INTRAMUSCULAR | Status: AC
  Filled 2018-03-06: qty 20

## 2018-03-06 MED ORDER — LACTATED RINGERS IV SOLN
INTRAVENOUS | Status: DC | PRN
  Administered 2018-03-06: 14:00:00 via INTRAVENOUS

## 2018-03-06 MED ORDER — MIDAZOLAM HCL 2 MG/2ML IJ SOLN
INTRAMUSCULAR | Status: AC
  Filled 2018-03-06: qty 2

## 2018-03-06 MED ORDER — METOCLOPRAMIDE HCL 5 MG/ML IJ SOLN: 10.0000 mg | Freq: Once | INTRAMUSCULAR | Status: DC | PRN

## 2018-03-06 MED ORDER — FENTANYL CITRATE (PF) 100 MCG/2ML IJ SOLN
INTRAMUSCULAR | Status: DC | PRN
  Administered 2018-03-06: 100 ug via EPIDURAL

## 2018-03-06 MED ORDER — BUPIVACAINE HCL 0.5 % IJ SOLN
INTRAMUSCULAR | Status: DC | PRN
  Administered 2018-03-06: 17 mL

## 2018-03-06 MED ORDER — LIDOCAINE HCL (CARDIAC) PF 100 MG/5ML IV SOSY
PREFILLED_SYRINGE | INTRAVENOUS | Status: AC
  Filled 2018-03-06: qty 5

## 2018-03-06 MED ORDER — LIDOCAINE-EPINEPHRINE (PF) 2 %-1:200000 IJ SOLN
INTRAMUSCULAR | Status: DC | PRN
  Administered 2018-03-06: 10 mL via EPIDURAL
  Administered 2018-03-06 (×2): 5 mL via EPIDURAL

## 2018-03-06 MED ORDER — ONDANSETRON HCL 4 MG/2ML IJ SOLN
INTRAMUSCULAR | Status: AC
  Filled 2018-03-06: qty 2

## 2018-03-06 MED ORDER — FENTANYL CITRATE (PF) 100 MCG/2ML IJ SOLN
INTRAMUSCULAR | Status: AC
  Filled 2018-03-06: qty 2

## 2018-03-06 MED ORDER — HYDROMORPHONE HCL 1 MG/ML IJ SOLN
INTRAMUSCULAR | Status: AC
  Filled 2018-03-06: qty 1

## 2018-03-06 MED ORDER — MEPERIDINE HCL 25 MG/ML IJ SOLN: 6.2500 mg | INTRAMUSCULAR | Status: DC | PRN

## 2018-03-06 SURGICAL SUPPLY — 23 items
APL SKNCLS STERI-STRIP NONHPOA (GAUZE/BANDAGES/DRESSINGS) ×1
BENZOIN TINCTURE PRP APPL 2/3 (GAUZE/BANDAGES/DRESSINGS) ×3 IMPLANT
CLOSURE WOUND 1/2 X4 (GAUZE/BANDAGES/DRESSINGS) ×1
CLOTH BEACON ORANGE TIMEOUT ST (SAFETY) ×3 IMPLANT
DRSG OPSITE POSTOP 3X4 (GAUZE/BANDAGES/DRESSINGS) ×3 IMPLANT
DURAPREP 26ML APPLICATOR (WOUND CARE) ×3 IMPLANT
GLOVE BIOGEL PI IND STRL 7.0 (GLOVE) ×3 IMPLANT
GLOVE BIOGEL PI INDICATOR 7.0 (GLOVE) ×6
GLOVE ECLIPSE 7.0 STRL STRAW (GLOVE) ×3 IMPLANT
GOWN STRL REUS W/TWL LRG LVL3 (GOWN DISPOSABLE) ×6 IMPLANT
GOWN STRL REUS W/TWL XL LVL3 (GOWN DISPOSABLE) ×3 IMPLANT
NEEDLE HYPO 22GX1.5 SAFETY (NEEDLE) ×3 IMPLANT
NS IRRIG 1000ML POUR BTL (IV SOLUTION) ×3 IMPLANT
PACK ABDOMINAL MINOR (CUSTOM PROCEDURE TRAY) ×3 IMPLANT
PROTECTOR NERVE ULNAR (MISCELLANEOUS) ×3 IMPLANT
SPONGE LAP 4X18 RFD (DISPOSABLE) ×2 IMPLANT
STRIP CLOSURE SKIN 1/2X4 (GAUZE/BANDAGES/DRESSINGS) ×2 IMPLANT
SUT VIC AB 0 CT1 27 (SUTURE) ×3
SUT VIC AB 0 CT1 27XBRD ANBCTR (SUTURE) ×1 IMPLANT
SUT VIC AB 4-0 PS2 27 (SUTURE) ×3 IMPLANT
SYR CONTROL 10ML LL (SYRINGE) ×3 IMPLANT
TOWEL OR 17X24 6PK STRL BLUE (TOWEL DISPOSABLE) ×6 IMPLANT
TRAY FOLEY W/BAG SLVR 14FR (SET/KITS/TRAYS/PACK) ×2 IMPLANT

## 2018-03-06 NOTE — Transfer of Care (Signed)
Immediate Anesthesia Transfer of Care Note  Patient: Nestor LewandowskyKeely M Embry  Procedure(s) Performed: POST PARTUM TUBAL LIGATION (Bilateral Abdomen)  Patient Location: PACU  Anesthesia Type:Epidural  Level of Consciousness: awake  Airway & Oxygen Therapy: Patient Spontanous Breathing  Post-op Assessment: Report given to RN and Post -op Vital signs reviewed and stable  Post vital signs: stable  Last Vitals:  Vitals Value Taken Time  BP 108/67 03/06/2018  3:25 PM  Temp    Pulse 71 03/06/2018  3:27 PM  Resp 15 03/06/2018  3:27 PM  SpO2 97 % 03/06/2018  3:27 PM  Vitals shown include unvalidated device data.  Last Pain:  Vitals:   03/06/18 1157  TempSrc: Oral  PainSc:       Patients Stated Pain Goal: 4 (03/06/18 0324)  Complications: No apparent anesthesia complications

## 2018-03-06 NOTE — Progress Notes (Signed)
CSW received a call from CPS worker, S. Bookman.  CPS reported there are no barriers to infant's discharge to [facility].  CPS will continue to offer the family resource and supports after discharge   Blaine HamperAngel Boyd-Gilyard, MSW, LCSW Clinical Social Work [phone]

## 2018-03-06 NOTE — Lactation Note (Signed)
This note was copied from a baby's chart. Lactation Consultation Note  Patient Name: Patty Mata ZOXWR'UToday's Date: 03/06/2018   Attempted to see three times earlier today.  Mom sleeping.  Now mom gone to surgery.  Grandmother in room with infant.  Will try to follow up with mom this pm.  Maternal Data    Feeding Feeding Type: Bottle Fed - Formula Nipple Type: Slow - flow  LATCH Score                   Interventions    Lactation Tools Discussed/Used     Consult Status      Desmond Tufano Michaelle CopasS Morado 03/06/2018, 3:07 PM

## 2018-03-06 NOTE — Anesthesia Preprocedure Evaluation (Signed)
Anesthesia Evaluation  Patient identified by MRN, date of birth, ID band Patient awake    Reviewed: Allergy & Precautions, H&P , NPO status , Patient's Chart, lab work & pertinent test results, reviewed documented beta blocker date and time   History of Anesthesia Complications (+) history of anesthetic complications  Airway Mallampati: I  TM Distance: >3 FB Neck ROM: full    Dental no notable dental hx. (+) Teeth Intact   Pulmonary neg pulmonary ROS, Current Smoker,    Pulmonary exam normal breath sounds clear to auscultation       Cardiovascular negative cardio ROS Normal cardiovascular exam Rhythm:regular Rate:Normal     Neuro/Psych PSYCHIATRIC DISORDERS Anxiety Depression negative neurological ROS     GI/Hepatic negative GI ROS, (+)     substance abuse  ,   Endo/Other  negative endocrine ROS  Renal/GU Renal disease  negative genitourinary   Musculoskeletal   Abdominal   Peds  Hematology negative hematology ROS (+)   Anesthesia Other Findings   Reproductive/Obstetrics Desires sterilization                             Anesthesia Physical  Anesthesia Plan  ASA: II  Anesthesia Plan: Epidural   Post-op Pain Management:    Induction:   PONV Risk Score and Plan: 3 and Midazolam, Dexamethasone, Ondansetron and Treatment may vary due to age or medical condition  Airway Management Planned: Natural Airway, Simple Face Mask and Nasal Cannula  Additional Equipment:   Intra-op Plan:   Post-operative Plan:   Informed Consent: I have reviewed the patients History and Physical, chart, labs and discussed the procedure including the risks, benefits and alternatives for the proposed anesthesia with the patient or authorized representative who has indicated his/her understanding and acceptance.   Dental advisory given  Plan Discussed with: CRNA and Surgeon  Anesthesia Plan Comments:          Anesthesia Quick Evaluation

## 2018-03-06 NOTE — Anesthesia Postprocedure Evaluation (Signed)
Anesthesia Post Note  Patient: Patty Mata  Procedure(s) Performed: POST PARTUM TUBAL LIGATION (Bilateral Abdomen)     Patient location during evaluation: PACU Anesthesia Type: Epidural Level of consciousness: awake and alert and oriented Pain management: pain level controlled Vital Signs Assessment: post-procedure vital signs reviewed and stable Respiratory status: spontaneous breathing, nonlabored ventilation and respiratory function stable Cardiovascular status: blood pressure returned to baseline and stable Postop Assessment: no headache, no backache, epidural receding, patient able to bend at knees and no apparent nausea or vomiting Anesthetic complications: no    Last Vitals:  Vitals:   03/06/18 1600 03/06/18 1615  BP: 123/69 131/70  Pulse: (!) 56 (!) 59  Resp: 16 13  Temp:    SpO2: 98% 98%    Last Pain:  Vitals:   03/06/18 1615  TempSrc:   PainSc: Asleep   Pain Goal: Patients Stated Pain Goal: 4 (03/06/18 0324)  LLE Motor Response: Purposeful movement (03/06/18 1615) LLE Sensation: Tingling (03/06/18 1615) RLE Motor Response: Purposeful movement (03/06/18 1615) RLE Sensation: Tingling (03/06/18 1615)      Shalicia Craghead A.

## 2018-03-06 NOTE — Clinical Social Work Maternal (Addendum)
CLINICAL SOCIAL WORK MATERNAL/CHILD NOTE  Patient Details  Name: Patty Mata MRN: [id number] Date of Birth: 1990-06-02  Date:  03/06/2018  Clinical Social Worker Initiating Note:  Laurey Arrow Date/Time: Initiated:  03/06/18/1422     Child's Name:  Patty Mata   Biological Parents:  Mother, Father   Need for Interpreter:  None   Reason for Referral:  Behavioral Health Concerns, Current Substance Use/Substance Use During Pregnancy    Address:  [address] [place] [address]    Phone number:  [phone] (home)     Additional phone number:   Household Members/Support Persons (HM/SP):   Household Member/Support Person 1, Household Member/Support Person 2   HM/SP Name Relationship DOB or Age  HM/SP -1 Roselyn Reef Trout FOB 04/04/1975  HM/SP -2 McKenzie Fender daughter 06/28/10  HM/SP -3        HM/SP -4        HM/SP -5        HM/SP -6        HM/SP -7        HM/SP -8          Natural Supports (not living in the home):  Immediate Family, Parent, Extended Family   Professional Supports: Case Manager/Social Worker(CPS worker S. Environmental health practitioner)   Employment: Full-time   Type of Work: MOB works at [employer]   Education:  [place] arranged:    Museum/gallery curator Resources:  [employer]   Other Resources:  [employer], Physicist, medical    Cultural/Religious Considerations Which May Impact Care:    Strengths:  Ability to meet basic needs , Home prepared for child , Engineer, materials, Understanding of illness   Psychotropic Medications:         Pediatrician:    [place] area  Pediatrician List:   [facility]  [place]      Pediatrician Fax Number:    Risk Factors/Current Problems:  Substance Use , Mental Health Concerns , DHHS Involvement    Cognitive State:  Able to Concentrate , Alert , Linear Thinking , Insightful    Mood/Affect:  Tearful  , Interested , Relaxed , Comfortable , Calm    CSW Assessment: CSW met with MOB in room 308. When CSW arrived, MOB was resting in bed, infant was asleep in bassinet, and FOB was watching TV.  With MOB's permission, CSW asked FOB to leave the room in order to meet with MOB in private. MOB was polite, easy to engage, and receptive to meeting with CSW.   CSW asked about MOB's MH hx and MOB acknowledged a hx of anxiety.  MOB reported being dx at an early age and regulating her symptoms with medications.  Per MOB, MOB does not have an active Rx but has an old Rx for Xanax that MOB's takes PRN.  CSW provided education regarding the baby blues period vs. perinatal mood disorders, discussed treatment and gave resources for mental health follow up if concerns arise.  CSW recommends self-evaluation during the postpartum time period using the New Mom Checklist from Postpartum Progress and encouraged MOB to contact a medical professional if symptoms are noted at any time. CSW assessed for safety and MOB denied SI, HI, and DV.  CSW offered MOB resources for outpatient counseling and MOB reported being an established patient at [facility].  MOB communicated feeling comfortable seeking help if help  is warranted.   CSW asked about  MOB's SA hx.  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 CSW congratulated MOB on her sobriety and encouraged MOB to continue. MOB also reported the use of marijuana throughout her pregnancy "In order to reduce my nausea." CSW explained to MOB that MOB had a positive screen for Benzo and MOB reported that MOB has an old Rx for Xanax. CSW made MOB aware of the hospital's drug screen policy and MOB was understanding.  MOB denied having a CPS hx and declined community resources.   MOB reported having all essential items for infant and feeling prepared to parent.   As CSW was leaving the room CPS worker S. Joan Flores was entering.   CPS will update CSW with infant's  disposition plan.   CSW Plan/Description:  Psychosocial Support and Ongoing Assessment of Needs, Sudden Infant Death Syndrome (SIDS) Education, Perinatal Mood and Anxiety Disorder (PMADs) Education, Other Patient/Family Education, Other Information/Referral to [place], Child Protective Service Report , CSW Will Continue to Monitor Umbilical Cord Tissue Drug Screen Results and Make Report if Warranted   Laurey Arrow, MSW, LCSW Clinical Social Work [phone]   Dimple Nanas, LCSW 03/06/2018, 4:04 PM

## 2018-03-06 NOTE — Progress Notes (Signed)
Faculty Attending Note  Post Partum Day 2  Subjective: Patient is feeling "like crap". She reports not well controlled pain on PO pain meds. She is ambulating and denies light-headedness or dizziness. She is passing flatus. She is tolerating a regular diet without nausea/vomiting. Bleeding is moderate. She is breast feeding. Baby is in nursery and doing well.  Objective: Blood pressure (!) 109/57, pulse 73, temperature 98.3 F (36.8 C), temperature source Oral, resp. rate 18, height 5\' 1"  (1.549 m), weight 62.1 kg, last menstrual period 06/13/2017, SpO2 97 %, unknown if currently breastfeeding. Temp:  [97.7 F (36.5 C)-98.3 F (36.8 C)] 98.3 F (36.8 C) (12/06 0830) Pulse Rate:  [66-85] 73 (12/06 0830) Resp:  [16-18] 18 (12/06 0830) BP: (109-131)/(57-99) 109/57 (12/06 0830) SpO2:  [97 %-100 %] 97 % (12/06 0830)  Physical Exam:  General: alert, oriented, cooperative Chest: CTAB, normal respiratory effort Heart: RRR  Abdomen: +BS, soft, appropriately tender to palpation  Uterine Fundus: firm, 2 fingers below the umbilicus Lochia: moderate, rubra DVT Evaluation: no evidence of DVT Extremities: no edema, no calf tenderness   Current Facility-Administered Medications:  .  acetaminophen (TYLENOL) tablet 650 mg, 650 mg, Oral, Q4H PRN, Peiffer, Shawn RouteSarah A, MD, 650 mg at 03/06/18 0949 .  benzocaine-Menthol (DERMOPLAST) 20-0.5 % topical spray 1 application, 1 application, Topical, PRN, Peiffer, Shawn RouteSarah A, MD, 1 application at 03/05/18 1215 .  ceFAZolin (ANCEF) IVPB 2g/100 mL premix, 2 g, Intravenous, Once, Peiffer, Shawn RouteSarah A, MD .  coconut oil, 1 application, Topical, PRN, Peiffer, Shawn RouteSarah A, MD, 1 application at 03/05/18 1517 .  witch hazel-glycerin (TUCKS) pad 1 application, 1 application, Topical, PRN **AND** dibucaine (NUPERCAINAL) 1 % rectal ointment 1 application, 1 application, Rectal, PRN, Peiffer, Sarah A, MD .  diphenhydrAMINE (BENADRYL) capsule 25 mg, 25 mg, Oral, Q6H PRN, Peiffer, Sarah  A, MD .  labetalol (NORMODYNE,TRANDATE) injection 20 mg, 20 mg, Intravenous, PRN **AND** labetalol (NORMODYNE,TRANDATE) injection 40 mg, 40 mg, Intravenous, PRN **AND** labetalol (NORMODYNE,TRANDATE) injection 80 mg, 80 mg, Intravenous, PRN **AND** hydrALAZINE (APRESOLINE) injection 10 mg, 10 mg, Intravenous, PRN **AND** Measure blood pressure, , , Once, Peiffer, Sarah A, MD .  HYDROmorphone (DILAUDID) injection 1 mg, 1 mg, Intravenous, Q3H PRN, Adam PhenixArnold, James G, MD, 1 mg at 03/06/18 0324 .  ibuprofen (ADVIL,MOTRIN) tablet 600 mg, 600 mg, Oral, Q6H, Peiffer, Shawn RouteSarah A, MD, 600 mg at 03/06/18 [id number] .  methylergonovine (METHERGINE) injection 0.2 mg, 0.2 mg, Intramuscular, Once, Peiffer, Sarah A, MD .  ondansetron (ZOFRAN) tablet 4 mg, 4 mg, Oral, Q4H PRN **OR** ondansetron (ZOFRAN) injection 4 mg, 4 mg, Intravenous, Q4H PRN, Peiffer, Sarah A, MD .  oxyCODONE (Oxy IR/ROXICODONE) immediate release tablet 10 mg, 10 mg, Oral, Q4H PRN, Conan Bowensavis, Iban Utz M, MD, 10 mg at 03/06/18 0948 .  oxyCODONE (Oxy IR/ROXICODONE) immediate release tablet 5 mg, 5 mg, Oral, Q4H PRN, Conan Bowensavis, Taitum Alms M, MD, 5 mg at 03/04/18 1652 .  oxytocin (PITOCIN) IV infusion 40 units in LR 1000 mL - Premix, 2.5 Units/hr, Intravenous, Continuous PRN, Peiffer, Sarah A, MD .  phenazopyridine (PYRIDIUM) tablet 200 mg, 200 mg, Oral, TID PRN, Peiffer, Shawn RouteSarah A, MD .  prenatal multivitamin tablet 1 tablet, 1 tablet, Oral, Q1200, Peiffer, Shawn RouteSarah A, MD, 1 tablet at 03/05/18 1215 .  senna-docusate (Senokot-S) tablet 2 tablet, 2 tablet, Oral, Q24H, Peiffer, Shawn RouteSarah A, MD, 2 tablet at 03/05/18 2318 .  simethicone (MYLICON) chewable tablet 80 mg, 80 mg, Oral, PRN, Peiffer, Sarah A, MD .  tamsulosin (FLOMAX) capsule 0.8 mg,  0.8 mg, Oral, QPC breakfast, Peiffer, Sarah A, MD, 0.8 mg at 03/06/18 0951 .  zolpidem (AMBIEN) tablet 5 mg, 5 mg, Oral, QHS PRN, Peiffer, Shawn Route, MD, 5 mg at 03/06/18 1610  Facility-Administered Medications Ordered in Other Encounters:  .   lidocaine (PF) (XYLOCAINE) 1 % injection, , , Anesthesia Intra-op, Bethena Midget, MD, 8 mL at 03/04/18 0825 Recent Labs    03/04/18 1305 03/05/18 0538  HGB 11.0* 9.6*  HCT 34.6* 29.0*    Assessment/Plan:  Patient is 27 y.o. [id number] PPD#1 s/p SVD at [redacted]w[redacted]d, complicated by preeclampsia with severe features, PPH, s/p IOL for cholestasis and kidney stones. She is doing okay, feels poorly due to pain from kidney stones, otherwise recovering appropriately. She is for BTL today, she again affirms desire to have bilateral tubal ligation done. She understands that this is a permanent procedure and she will not be able to have children after it is done. Reviewed risks of bilateral tubal ligation including infection, hemorrhage, damage to surrounding tissue and organs, risk of regret. Reviewed that bilateral tubal ligation is not 100% effective and she should take a pregnancy test if she believes for any reason she may be pregnant. Reviewed slightly increased risk of ectopic pregnancy and need to seek care if she becomes pregnant. She understands this is an elective procedure and again affirms her desire. Consent signed, also consents to blood transfusion if necessary.   NPO until OR today Continue routine post partum care Pain meds prn For PPTL for birth control Plan for discharge later today    K. Therese Sarah, M.D. Attending Center for [facility] (Faculty Practice)  03/06/2018, 10:07 AM

## 2018-03-06 NOTE — Op Note (Signed)
   Patty LewandowskyKeely M Mata 03/03/2018 - 03/06/2018  PREOPERATIVE DIAGNOSES: Multiparity, undesired fertility  POSTOPERATIVE DIAGNOSES: Multiparity, undesired fertility  PROCEDURE:  Postpartum Bilateral Tubal Sterilization via Modified Pomeroy fashion   SURGEON: Baldemar LenisK. Meryl Davis, MD  ANESTHESIA:  Epidural and local analgesia using 25 ml of 0.25% Marcaine  COMPLICATIONS:  None immediate.  ESTIMATED BLOOD LOSS: 15 ml.  FLUIDS: 600 ml LR.  URINE OUTPUT:  200 ml of clear urine.  INDICATIONS:  27 y.o. [id number] with undesired fertility,status post vaginal delivery, desires permanent sterilization.  Other reversible forms of contraception were discussed with patient; she declines all other modalities. Risks of procedure discussed with patient including but not limited to: risk of regret, permanence of method, bleeding, infection, injury to surrounding organs and need for additional procedures.  Failure risk of 1 -2 % with increased risk of ectopic gestation if pregnancy occurs was also discussed with patient.      FINDINGS:  Normal uterus, tubes, and ovaries.  PROCEDURE DETAILS: The patient was taken to the operating room where her epidural anesthesia was dosed up to surgical level and found to be inadequate, patient made comfortable with injection of 0.25% marcaine.  She was then placed in the dorsal supine position and prepped and draped in sterile fashion.  After an adequate timeout was performed, attention was turned to the patient's abdomen where a 4 cm transverse skin incision was made inferior to the umbilicus. The incision was taken down to the layer of fascia using the scalpel, and fascia was incised, and extended bilaterally using Mayo scissors. The peritoneum was entered sharply and the uterus and tubes were visualized.     Attention was then turned to the patient's uterus, and the left fallopian tube was clamped using a babcock and followed out to its fimbriated end. A 3 cm portion of the  mid-portion of the fallopian tube was grasped with babcock graspers and elevated. The elevated loop of tube was tied off with 2-0 plain gut. A second tie was placed under the first tie and a 2 cm loop of tube was excised by bluntly creating a hole in the mesosalpinx under the tied off portion above the suture and excising each end of tube with the metzenbaum scissors via a modified pomeroy fashion.  The remaining pedicles of the tube were inspected and found to be hemostatic. The right fallopian tube was visualized and followed out to the fimbriated end.  A similar process was carried out for the right fallopian tube and hemostasis noted at the pedicles. Good hemostasis was noted overall. The instruments were then removed from the patient's abdomen and the fascial incision was repaired with 0 Vicryl, the subcutaneous tissue was closed with 2-0 plain and the skin was closed with a 4-0 Monocryl subcuticular stitch. Marcaine 0.025% was injected around the incision. The patient tolerated the procedure well.  Instrument, sponge, and needle counts were correct times two.  The patient was then taken to the recovery room awake and in stable condition.   Baldemar LenisK. Meryl Davis, M.D. Attending Center for [facility](Faculty Practice)

## 2018-03-07 LAB — COMPREHENSIVE METABOLIC PANEL
ALT: 109 U/L — ABNORMAL HIGH (ref 0–44)
AST: 36 U/L (ref 15–41)
Albumin: 2.4 g/dL — ABNORMAL LOW (ref 3.5–5.0)
Alkaline Phosphatase: 155 U/L — ABNORMAL HIGH (ref 38–126)
Anion gap: 8 (ref 5–15)
BUN: 6 mg/dL (ref 6–20)
CALCIUM: 8.9 mg/dL (ref 8.9–10.3)
CO2: 26 mmol/L (ref 22–32)
Chloride: 102 mmol/L (ref 98–111)
Creatinine, Ser: 0.57 mg/dL (ref 0.44–1.00)
GFR calc Af Amer: >60 mL/min (ref >60)
GFR calc non Af Amer: >60 mL/min (ref >60)
Glucose, Bld: 88 mg/dL (ref 70–99)
Potassium: 4.3 mmol/L (ref 3.5–5.1)
Sodium: 136 mmol/L (ref 135–145)
Total Bilirubin: 0.4 mg/dL (ref 0.3–1.2)
Total Protein: 5.5 g/dL — ABNORMAL LOW (ref 6.5–8.1)

## 2018-03-07 LAB — CBC
HCT: 29.6 % — ABNORMAL LOW (ref 36.0–46.0)
HEMOGLOBIN: 9.6 g/dL — AB (ref 12.0–15.0)
MCH: 30.6 pg (ref 26.0–34.0)
MCHC: 32.4 g/dL (ref 30.0–36.0)
MCV: 94.3 fL (ref 80.0–100.0)
Platelets: 164 10*3/uL (ref 150–400)
RBC: 3.14 MIL/uL — ABNORMAL LOW (ref 3.87–5.11)
RDW: 13 % (ref 11.5–15.5)
WBC: 7.3 10*3/uL (ref 4.0–10.5)
nRBC: 0 % (ref 0.0–0.2)

## 2018-03-07 MED ORDER — AMLODIPINE BESYLATE 5 MG PO TABS: 5.0000 mg | ORAL_TABLET | Freq: Every day | ORAL | 0 refills | Status: DC

## 2018-03-07 MED ORDER — HYDROCODONE-ACETAMINOPHEN 5-325 MG PO TABS: 1.0000 | ORAL_TABLET | Freq: Four times a day (QID) | ORAL | 0 refills | Status: DC | PRN

## 2018-03-07 MED ORDER — IBUPROFEN 600 MG PO TABS: 600.0000 mg | ORAL_TABLET | Freq: Four times a day (QID) | ORAL | 0 refills | Status: DC

## 2018-03-07 NOTE — Progress Notes (Signed)
Discharge instructions and prescriptions given to pt. Discussed post-vaginal delivery and tubal care, signs and symptoms to report to the MD, upcoming appointments, and meds. Pt verbalizes understanding and has no questions or concerns at this time. Pt refused MMR vaccine. Pt discharged from hospital in stable condition.

## 2018-03-07 NOTE — Discharge Instructions (Signed)
Kidney Stones  You will need to follow up with [facility] re: the stones. Kidney stones (urolithiasis) are rock-like masses that form inside of the kidneys. Kidneys are organs that make pee (urine). A kidney stone can cause very bad pain and can block the flow of pee. The stone usually leaves your body (passes) through your pee. You may need to have a doctor take out the stone. Follow these instructions at home: Eating and drinking  Drink enough fluid to keep your pee clear or pale yellow. This will help you pass the stone.  If told by your doctor, change the foods you eat (your diet). This may include: ? Limiting how much salt (sodium) you eat. ? Eating more fruits and vegetables. ? Limiting how much meat, poultry, fish, and eggs you eat.  Follow instructions from your doctor about eating or drinking restrictions. General instructions  Collect pee samples as told by your doctor. You may need to collect a pee sample: ? 24 hours after a stone comes out. ? 8-12 weeks after a stone comes out, and every 6-12 months after that.  Strain your pee every time you pee (urinate), for as long as told. Use the strainer that your doctor recommends.  Do not throw out the stone. Keep it so that it can be tested by your doctor.  Take over-the-counter and prescription medicines only as told by your doctor.  Keep all follow-up visits as told by your doctor. This is important. You may need follow-up tests. Preventing kidney stones To prevent another kidney stone:  Drink enough fluid to keep your pee clear or pale yellow. This is the best way to prevent kidney stones.  Eat healthy foods.  Avoid certain foods as told by your doctor. You may be told to eat less protein.  Stay at a healthy weight.  Contact a doctor if:  You have pain that gets worse or does not get better with medicine. Get help right away if:  You have a fever or chills.  You get very bad pain.  You get new pain in  your belly (abdomen).  You pass out (faint).  You cannot pee. This information is not intended to replace advice given to you by your health care provider. Make sure you discuss any questions you have with your health care provider. Document Released: 09/04/2007 Document Revised: 12/05/2015 Document Reviewed: 12/05/2015 Elsevier Interactive Patient Education  2017 [employer]

## 2018-03-07 NOTE — Addendum Note (Signed)
Addendum  created 03/07/18 0815 by Elgie CongoMalinova, Trev Boley H, CRNA   Sign clinical note

## 2018-03-07 NOTE — Lactation Note (Addendum)
This note was copied from a baby's chart. Lactation Consultation Note  Patient Name: Patty Mata [id number] Date: 03/07/2018 Reason for consult: Follow-up assessment;Early term 2-38.6wks  Mom seems to be primarily formula feeding. Mom said she feels like her milk is coming to volume; she has no breast complaints. Mom says that she'll use a hand pump, but not an electric pump. Mom was shown how to assemble & use hand pump (on single- & double-mode) that was included in pump kit. She also knows how to use the [employer].  The hand-out from the CDC, "How to Keep Your Breast Pump Kit Clean," was provided to Mom. I emphasized boiling pump parts for 10 min/day to sterilize.   I asked Mom not to smoke THC while breastfeeding.   Dr. Glo Herring was made aware that pyridium is contraindicated in breastfeeding moms, which he removed from her discharge medication list. Delsa Sale, RN to ensure it is crossed off her med list on her AVS.  Mom is taking tamsulosin .8m qd (L3) & amlodipine 5 mg (L3).  RMatthias HughsHSentara Obici Amb[facility] Surgery LLC12/09/2017, 10:00 AM

## 2018-03-07 NOTE — Anesthesia Postprocedure Evaluation (Signed)
Anesthesia Post Note  Patient: Patty Mata  Procedure(s) Performed: POST PARTUM TUBAL LIGATION (Bilateral Abdomen)     Patient location during evaluation: [facility] Anesthesia Type: Epidural Level of consciousness: awake and alert Pain management: pain level controlled Vital Signs Assessment: post-procedure vital signs reviewed and stable Respiratory status: spontaneous breathing, nonlabored ventilation and respiratory function stable Cardiovascular status: stable Postop Assessment: no headache, no backache, epidural receding, able to ambulate, adequate PO intake, no apparent nausea or vomiting and patient able to bend at knees Anesthetic complications: no    Last Vitals:  Vitals:   03/06/18 2330 03/07/18 0536  BP: 129/84 120/80  Pulse: 90 74  Resp:  18  Temp: 36.8 C 37 C  SpO2: 98% 97%    Last Pain:  Vitals:   03/07/18 0700  TempSrc:   PainSc: Asleep   Pain Goal: Patients Stated Pain Goal: 4 (03/07/18 0039)               Laban EmperorMalinova,Yoshio Seliga Hristova
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
## 2018-03-07 NOTE — Discharge Summary (Signed)
Postpartum Discharge Summary     Patient Name: Patty Mata DOB: Jul 26, 1990 MRN: [id number]  Date of admission: 03/03/2018 Delivering Provider: Tamera Stands   Date of discharge: 03/07/2018  Admitting diagnosis: 37 WKS, KIDNEY STONES Patty Mata is a 27 y.o. G2P1001 at [redacted]w[redacted]d by 6wk u/s, admitted for IOL d/t ICP and elevated LFTs. Pregnancy has been complicated by nephrolithiasis and has been rx'd dilaudid, flomax, and uripas, has appt w/ urology 12/19. She came in tonight w/ worsening pain and significantly elevated LFTs 156/253. She had bile acids of 10.4 on 11/21, at that time LFTs were 45/65. Recently ran out of dilaudid.  Some itching, but on chest, minimal on palms/soles.  O: VSS, has had 2 mildly elevated bp's (131/90, 104/92), very labile- last bp 100/56     Comfortable at this time, +fm, no uc's    UDS + opiates, benzo, THC A: [redacted]w[redacted]d pregnant w/ ICP and elevated LFTs, nephrolithiasis W/ 2 elevated bp's and elevated LFTs will also check P:C ratio. CMP normal, Cr 0.62 P: cytotec q4hr, foley bulb when able Intrauterine pregnancy: [redacted]w[redacted]d     Secondary diagnosis:  Active Problems:   Cholestasis  Additional problems: postpartum sterilization     Discharge diagnosis: Term Pregnancy Delivered and preeclampsia severe, ICP, kidneystones.                                                                                                Post partum procedures:postpartum tubal ligation  Augmentation: Pitocin, Cytotec and Foley Balloon  Complications: None  Hospital course:  Induction of Labor With Vaginal Delivery   27 y.o. yo [id number] at [redacted]w[redacted]d was admitted to the hospital 03/03/2018 for induction of labor.  Indication for induction: Cholestasis of pregnancy and kidneystones, symptomatic, .  Patient had an complicated labor course as follows: she was placed on Mag sulfate due to BP elevation in labor, combined with elevated LFT. Membrane Rupture Time/Date: 11:00 AM ,03/04/2018    Intrapartum Procedures: Episiotomy: None [1]                                         Lacerations:  None [1]  Patient had delivery of a Viable infant.  Information for the patient's newborn:  Patty, Mata [[id number]]  Delivery Method: Vag-Spont   03/04/2018  Details of delivery can be found in separate delivery note.   Patient is a 27 y.o. now G2P2 s/p NSVD at [redacted]w[redacted]d, who was admitted for IOL due to ICP, nephrolithiasis. SROM 0h 22m prior to delivery with clear fluid. Thin meconium observed at the delivery of the body. Head delivered LOA. Body cord present. Shoulder and body delivered in usual fashion. Infant with spontaneous cry, placed on mother's abdomen, dried and bulb suctioned. Cord clamped x 2 after 1-minute delay, and cut by family member. Cord blood drawn. Placenta delivered spontaneously with gentle cord traction. Fundus firm with massage and Pitocin. Perineum inspected and found to have no laceration.  Bleeding continued at slow trickle following delivery.  Cervix walked out with ring forceps and found intact. Bleeding determined to be from above, most likely related to poor tone of lower uterine segment. Bimanual massage with removal of several moderate sized clots. Methergine and cytotec with improved hemostasis.   Delivery Note At 11:38 AM a viable female was delivered via Vaginal, Spontaneous (Presentation: vertex; LOA  ).  APGAR: 9, 9; weight  3033g Placenta status: intact, 3 vessel cord.   Anesthesia: Epidural  Episiotomy: None Lacerations: None Est. Blood Loss (mL): 421 + additional soaked pad not weighed, estimate 600cc prior to transfer to antepartum.  Mom to postpartum.  Baby to Couplet care / Skin to Skin.  [name] 03/04/2018, 12:21 PM  Attestation: I have seen this patient and agree with the resident's documentation. I was present and assisted with the delivery and management of the postpartum bleeding. Patient also with elevated blood pressures  immediately postpartum. Observed on L&D for about 2 additional hours with persistent elevated blood pressures moderate range 150s systolic and headache. Given known elevated LFTs without clear source, will start magnesium for seizure prophylaxis in setting of possible developing postpartum preeclampsia/HELLP. Labs rechecked and stable, LFTs actually slightly improved. Plan for BTL in morning given consent signed 02/03/18. Will go to OB high risk given magnesium.   [name], DO OB/GYN Fellow    Patient had a routine postpartum course. Patient is discharged home 03/07/18.  Magnesium Sulfate recieved: Yes BMZ received: No  Physical exam  Vitals:   03/06/18 2033 03/06/18 2330 03/07/18 0536 03/07/18 0815  BP: 128/81 129/84 120/80 (!) 142/87  Pulse: 87 90 74 72  Resp: 18  18 18   Temp: 98.4 F (36.9 C) 98.3 F (36.8 C) 98.6 F (37 C) 98.2 F (36.8 C)  TempSrc: Oral Oral Oral Oral  SpO2: 98% 98% 97% 97%  Weight:      Height:       General: alert and cooperative Lochia: appropriate Uterine Fundus: firm Incision: Healing well with no significant drainage, some rlq pain attributed to partial salpingectomy DVT Evaluation: No evidence of DVT seen on physical exam. Labs: Lab Results  Component Value Date   WBC 7.3 03/07/2018   HGB 9.6 (L) 03/07/2018   HCT 29.6 (L) 03/07/2018   MCV 94.3 03/07/2018   PLT 164 03/07/2018   CMP Latest Ref Rng & Units 03/07/2018  Glucose 70 - 99 mg/dL 88  BUN 6 - 20 mg/dL 6  Creatinine 1.610.44 - 0.961.00 mg/dL 0.450.57  Sodium 409135 - 811145 mmol/L 136  Potassium 3.5 - 5.1 mmol/L 4.3  Chloride 98 - 111 mmol/L 102  CO2 22 - 32 mmol/L 26  Calcium 8.9 - 10.3 mg/dL 8.9  Total Protein 6.5 - 8.1 g/dL 9.1(Y5.5(L)  Total Bilirubin 0.3 - 1.2 mg/dL 0.4  Alkaline Phos 38 - 126 U/L 155(H)  AST 15 - 41 U/L 36  ALT 0 - 44 U/L 109(H)    Discharge instruction: per After Visit Summary and "Baby and Me Booklet".  After visit meds:  Allergies as of 03/07/2018       Reactions   Penicillins Rash   Has patient had a PCN reaction causing immediate rash, facial/tongue/throat swelling, SOB or lightheadedness with hypotension: Yes Has patient had a PCN reaction causing severe rash involving mucus membranes or skin necrosis: No Has patient had a PCN reaction that required hospitalization: No Has patient had a PCN reaction occurring within the last 10 years: No If all of the above answers are "NO", then may  proceed with Cephalosporin use.      Medication List    TAKE these medications   amLODipine 5 MG tablet Commonly known as:  NORVASC Take 1 tablet (5 mg total) by mouth daily.   HYDROcodone-acetaminophen 5-325 MG tablet Commonly known as:  NORCO/VICODIN Take 1-2 tablets by mouth every 6 (six) hours as needed for moderate pain. May take with ibuprofen   ibuprofen 600 MG tablet Commonly known as:  ADVIL,MOTRIN Take 1 tablet (600 mg total) by mouth every 6 (six) hours.   phenazopyridine 200 MG tablet Commonly known as:  PYRIDIUM Take 1 tablet (200 mg total) by mouth 3 (three) times daily as needed for pain.   prenatal multivitamin Tabs tablet Take 1 tablet by mouth daily at 12 noon.   tamsulosin 0.4 MG Caps capsule Commonly known as:  FLOMAX Take 2 capsules (0.8 mg total) by mouth daily after breakfast.     ASK your doctor about these medications   HYDROmorphone 2 MG tablet Commonly known as:  DILAUDID Take 1 tablet (2 mg total) by mouth every 4 (four) hours as needed for up to 1 day for severe pain. Ask about: Should I take this medication?       Diet: routine diet  Activity: Advance as tolerated. Pelvic rest for 6 weeks.   Outpatient follow up:2 weeks Follow up Appt: Future Appointments  Date Time Provider Department Center  03/11/2018  3:10 PM Loletta Specter, PA-C [facility] None  03/18/2018  9:00 AM [facility] None  04/02/2018  2:30 PM Raelyn Mora, CNM [facility] None  04/09/2018  1:30 PM [facility]   Follow up Visit: Follow-up Information    [facility] Follow up.   Specialty:  Obstetrics and Gynecology Contact information: [address] [place] [address] [phone]       [facility] Follow up.   Contact information: [address] [place] [address] [place] [address] [phone]           Please schedule this patient for Postpartum visit in: 1-2 wk with the following provider: [facility] For C/S patients schedule nurse incision check in weeks 2 weeks: no High risk pregnancy complicated by: ICP, kidneystones, preeclampsia Delivery mode:  SVD Anticipated Birth Control:  BTL done PP PP Procedures needed:   Schedule Integrated BH visit: no      Newborn Data: Live born female  Birth Weight: 6 lb 11 oz (3033 g) APGAR: 9, 9  Newborn Delivery   Birth date/time:  03/04/2018 11:38:00 Delivery type:  Vaginal, Spontaneous     Baby Feeding: Bottle and Breast Disposition:home with mother   03/07/2018 Tilda Burrow, MD

## 2018-03-11 ENCOUNTER — Ambulatory Visit (INDEPENDENT_AMBULATORY_CARE_PROVIDER_SITE_OTHER): Payer: Medicaid Other | Admitting: Physician Assistant

## 2018-03-11 ENCOUNTER — Encounter (HOSPITAL_COMMUNITY): Payer: Self-pay

## 2018-03-18 ENCOUNTER — Institutional Professional Consult (permissible substitution): Payer: Medicaid Other

## 2018-03-18 ENCOUNTER — Ambulatory Visit: Payer: Medicaid Other

## 2018-04-02 ENCOUNTER — Ambulatory Visit: Payer: Medicaid Other | Admitting: Obstetrics and Gynecology

## 2018-04-03 ENCOUNTER — Ambulatory Visit: Payer: Medicaid Other | Admitting: Nurse Practitioner

## 2018-04-09 ENCOUNTER — Institutional Professional Consult (permissible substitution): Payer: Medicaid Other

## 2018-04-14 ENCOUNTER — Telehealth: Payer: Self-pay | Admitting: *Deleted

## 2018-04-14 NOTE — Telephone Encounter (Signed)
-----   Message from Marti Sleigh, [place] sent at 04/13/2018  4:51 PM EST ----- Regarding: CPS Call Janice Coffin on cell [phone].

## 2018-04-14 NOTE — Telephone Encounter (Signed)
Return call to Janice CoffinShenika Bookman, [facility] [employer]. She wanted information regarding patient use of Xanax during pregnancy.  Release of information was faxed to clinic. Informed Percell MillerShenika that patient did not mentioned to nurse of midwife during office visits of any use of Xanax. Per Percell MillerShenika, the case was brought to her during patient's delivery. Patient had positive drug screen. Patient also has missed her postpartum visit.   Clovis PuMartin, Brieanna Nau L, RN

## 2018-05-13 ENCOUNTER — Emergency Department (HOSPITAL_COMMUNITY)
Admission: EM | Admit: 2018-05-13 | Discharge: 2018-05-13 | Discharge status: Against Medical Advice | Payer: Medicaid Other | Attending: Emergency Medicine | Admitting: Emergency Medicine

## 2018-05-13 ENCOUNTER — Emergency Department (HOSPITAL_COMMUNITY): Payer: Medicaid Other

## 2018-05-13 ENCOUNTER — Encounter (HOSPITAL_COMMUNITY): Payer: Self-pay | Admitting: *Deleted

## 2018-05-13 DIAGNOSIS — R1031 Right lower quadrant pain: Secondary | ICD-10-CM | POA: Diagnosis not present

## 2018-05-13 DIAGNOSIS — M542 Cervicalgia: Secondary | ICD-10-CM | POA: Insufficient documentation

## 2018-05-13 DIAGNOSIS — M545 Low back pain, unspecified: Secondary | ICD-10-CM

## 2018-05-13 DIAGNOSIS — Z79899 Other long term (current) drug therapy: Secondary | ICD-10-CM | POA: Insufficient documentation

## 2018-05-13 DIAGNOSIS — F17210 Nicotine dependence, cigarettes, uncomplicated: Secondary | ICD-10-CM | POA: Insufficient documentation

## 2018-05-13 LAB — CBC WITH DIFFERENTIAL/PLATELET
Abs Immature Granulocytes: 0.02 10*3/uL (ref 0.00–0.07)
Basophils Absolute: 0.1 10*3/uL (ref 0.0–0.1)
Basophils Relative: 1 %
Eosinophils Absolute: 1.4 10*3/uL — ABNORMAL HIGH (ref 0.0–0.5)
Eosinophils Relative: 15 %
HCT: 41.8 % (ref 36.0–46.0)
Hemoglobin: 12.9 g/dL (ref 12.0–15.0)
Immature Granulocytes: 0 %
Lymphocytes Relative: 35 %
Lymphs Abs: 3.3 10*3/uL (ref 0.7–4.0)
MCH: 28.2 pg (ref 26.0–34.0)
MCHC: 30.9 g/dL (ref 30.0–36.0)
MCV: 91.3 fL (ref 80.0–100.0)
Monocytes Absolute: 0.5 10*3/uL (ref 0.1–1.0)
Monocytes Relative: 5 %
Neutro Abs: 4 10*3/uL (ref 1.7–7.7)
Neutrophils Relative %: 44 %
Platelets: 225 10*3/uL (ref 150–400)
RBC: 4.58 MIL/uL (ref 3.87–5.11)
RDW: 14.2 % (ref 11.5–15.5)
WBC: 9.3 10*3/uL (ref 4.0–10.5)
nRBC: 0 % (ref 0.0–0.2)

## 2018-05-13 LAB — COMPREHENSIVE METABOLIC PANEL
ALT: 23 U/L (ref 0–44)
AST: 17 U/L (ref 15–41)
Albumin: 3.5 g/dL (ref 3.5–5.0)
Alkaline Phosphatase: 51 U/L (ref 38–126)
Anion gap: 7 (ref 5–15)
BUN: 11 mg/dL (ref 6–20)
CO2: 24 mmol/L (ref 22–32)
Calcium: 8.8 mg/dL — ABNORMAL LOW (ref 8.9–10.3)
Chloride: 108 mmol/L (ref 98–111)
Creatinine, Ser: 0.65 mg/dL (ref 0.44–1.00)
GFR calc Af Amer: >60 mL/min (ref >60)
GFR calc non Af Amer: >60 mL/min (ref >60)
Glucose, Bld: 96 mg/dL (ref 70–99)
Potassium: 4.1 mmol/L (ref 3.5–5.1)
Sodium: 139 mmol/L (ref 135–145)
Total Bilirubin: 0.3 mg/dL (ref 0.3–1.2)
Total Protein: 5.6 g/dL — ABNORMAL LOW (ref 6.5–8.1)

## 2018-05-13 LAB — I-STAT BETA HCG BLOOD, ED (MC, WL, AP ONLY): I-stat hCG, quantitative: <5 m[IU]/mL (ref <5)

## 2018-05-13 MED ORDER — IOHEXOL 300 MG/ML  SOLN: 100.0000 mL | Freq: Once | INTRAMUSCULAR | Status: DC | PRN

## 2018-05-13 MED ORDER — ACETAMINOPHEN 325 MG PO TABS
650.0000 mg | ORAL_TABLET | Freq: Once | ORAL | Status: AC
  Administered 2018-05-13: 650 mg via ORAL
  Filled 2018-05-13: qty 2
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
## 2018-05-13 NOTE — ED Notes (Signed)
This RN called Xray to have pt transported to room when finished.
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
## 2018-05-30 ENCOUNTER — Encounter: Payer: Self-pay | Admitting: Emergency Medicine

## 2018-05-30 ENCOUNTER — Other Ambulatory Visit: Payer: Self-pay

## 2018-05-30 ENCOUNTER — Emergency Department
Admission: EM | Admit: 2018-05-30 | Discharge: 2018-05-30 | Disposition: A | Discharge status: Home / Self Care | Payer: Medicaid Other | Attending: Emergency Medicine | Admitting: Emergency Medicine

## 2018-05-30 ENCOUNTER — Emergency Department: Payer: Medicaid Other

## 2018-05-30 DIAGNOSIS — F17210 Nicotine dependence, cigarettes, uncomplicated: Secondary | ICD-10-CM | POA: Diagnosis not present

## 2018-05-30 DIAGNOSIS — F329 Major depressive disorder, single episode, unspecified: Secondary | ICD-10-CM | POA: Insufficient documentation

## 2018-05-30 DIAGNOSIS — F419 Anxiety disorder, unspecified: Secondary | ICD-10-CM | POA: Diagnosis not present

## 2018-05-30 DIAGNOSIS — R1030 Lower abdominal pain, unspecified: Secondary | ICD-10-CM | POA: Insufficient documentation

## 2018-05-30 DIAGNOSIS — R109 Unspecified abdominal pain: Secondary | ICD-10-CM

## 2018-05-30 LAB — CBC WITH DIFFERENTIAL/PLATELET
Abs Immature Granulocytes: 0.02 10*3/uL (ref 0.00–0.07)
BASOS PCT: 1 %
Basophils Absolute: 0.1 10*3/uL (ref 0.0–0.1)
Eosinophils Absolute: 0.3 10*3/uL (ref 0.0–0.5)
Eosinophils Relative: 3 %
HCT: 42.1 % (ref 36.0–46.0)
Hemoglobin: 13.3 g/dL (ref 12.0–15.0)
Immature Granulocytes: 0 %
Lymphocytes Relative: 22 %
Lymphs Abs: 2.3 10*3/uL (ref 0.7–4.0)
MCH: 28.7 pg (ref 26.0–34.0)
MCHC: 31.6 g/dL (ref 30.0–36.0)
MCV: 90.9 fL (ref 80.0–100.0)
MONO ABS: 0.6 10*3/uL (ref 0.1–1.0)
Monocytes Relative: 6 %
NEUTROS ABS: 7 10*3/uL (ref 1.7–7.7)
Neutrophils Relative %: 68 %
Platelets: 198 10*3/uL (ref 150–400)
RBC: 4.63 MIL/uL (ref 3.87–5.11)
RDW: 14.7 % (ref 11.5–15.5)
WBC: 10.4 10*3/uL (ref 4.0–10.5)
nRBC: 0 % (ref 0.0–0.2)

## 2018-05-30 LAB — COMPREHENSIVE METABOLIC PANEL
ALT: 29 U/L (ref 0–44)
AST: 21 U/L (ref 15–41)
Albumin: 4.4 g/dL (ref 3.5–5.0)
Alkaline Phosphatase: 68 U/L (ref 38–126)
Anion gap: 6 (ref 5–15)
BUN: 19 mg/dL (ref 6–20)
CHLORIDE: 108 mmol/L (ref 98–111)
CO2: 24 mmol/L (ref 22–32)
Calcium: 8.8 mg/dL — ABNORMAL LOW (ref 8.9–10.3)
Creatinine, Ser: 0.71 mg/dL (ref 0.44–1.00)
GFR calc Af Amer: >60 mL/min (ref >60)
GFR calc non Af Amer: >60 mL/min (ref >60)
Glucose, Bld: 90 mg/dL (ref 70–99)
Potassium: 4 mmol/L (ref 3.5–5.1)
Sodium: 138 mmol/L (ref 135–145)
Total Bilirubin: 0.4 mg/dL (ref 0.3–1.2)
Total Protein: 7.2 g/dL (ref 6.5–8.1)

## 2018-05-30 LAB — URINALYSIS, COMPLETE (UACMP) WITH MICROSCOPIC
Bilirubin Urine: NEGATIVE
Glucose, UA: NEGATIVE mg/dL
Hgb urine dipstick: NEGATIVE
Ketones, ur: 5 mg/dL — AB
Leukocytes,Ua: NEGATIVE
Nitrite: NEGATIVE
PROTEIN: NEGATIVE mg/dL
Specific Gravity, Urine: 1.025 (ref 1.005–1.030)
pH: 5 (ref 5.0–8.0)

## 2018-05-30 LAB — POCT PREGNANCY, URINE: Preg Test, Ur: NEGATIVE

## 2018-05-30 MED ORDER — DIAZEPAM 5 MG PO TABS: 5.0000 mg | ORAL_TABLET | Freq: Three times a day (TID) | ORAL | 0 refills | Status: DC | PRN

## 2018-05-30 MED ORDER — OXYCODONE-ACETAMINOPHEN 5-325 MG PO TABS
2.0000 | ORAL_TABLET | Freq: Once | ORAL | Status: AC
  Administered 2018-05-30: 2 via ORAL
  Filled 2018-05-30: qty 2

## 2018-05-30 MED ORDER — IBUPROFEN 800 MG PO TABS: 800.0000 mg | ORAL_TABLET | Freq: Three times a day (TID) | ORAL | 0 refills | Status: DC | PRN

## 2018-05-30 NOTE — ED Provider Notes (Signed)
[facility] Emergency Department Provider Note       Time seen: ----------------------------------------- 5:17 PM on 05/30/2018 -----------------------------------------   I have reviewed the triage vital signs and the nursing notes.  HISTORY   Chief Complaint Flank Pain    HPI Patty Mata is a 28 y.o. female with a history of anxiety, kidney stones who presents to the ED for flank pain.  Patient feels like she is passing a kidney stone.  Patient reports a history of renal colic, states pain started about 4 days ago.  She denies fevers, chills or other complaints.  Past Medical History:  Diagnosis Date  . Anxiety   . Kidney stones     Patient Active Problem List   Diagnosis Date Noted  . Cholestasis 03/03/2018  . Elevated liver function tests 02/19/2018  . No leakage of amniotic fluid into vagina 01/07/2018  . Rubella non-immune status, antepartum 11/04/2017  . Frequent UTI 10/30/2017  . Anxiety and depression 10/30/2017    Past Surgical History:  Procedure Laterality Date  . STENT PLACEMENT NON-VASCULAR ([facility] HX)     renal  . TUBAL LIGATION Bilateral 03/06/2018   Procedure: POST PARTUM TUBAL LIGATION;  Surgeon: Lesly Dukes, MD;  Location: [facility] BIRTHING SUITES;  Service: Gynecology;  Laterality: Bilateral;    Allergies Penicillins  Social History Social History   Tobacco Use  . Smoking status: Current Every Day Smoker    Packs/day: 0.25    Years: 5.00    Pack years: 1.25    Types: Cigarettes  . Smokeless tobacco: Never Used  Substance Use Topics  . Alcohol use: Not Currently    Comment: Socially; denies today  . 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 Cardiovascular: Negative for chest pain. Respiratory: Negative for shortness of breath. Gastrointestinal: Positive for flank  pain Musculoskeletal: Negative for back pain.  Skin: Negative for rash. Neurological: Negative for headaches, focal weakness or numbness.  All systems negative/normal/unremarkable except as stated in the HPI  ____________________________________________   PHYSICAL EXAM:  VITAL SIGNS: ED Triage Vitals  Enc Vitals Group     BP 05/30/18 1428 (!) 154/93     Pulse Rate 05/30/18 1428 95     Resp 05/30/18 1428 18     Temp 05/30/18 1428 97.9 F (36.6 C)     Temp Source 05/30/18 1428 Oral     SpO2 05/30/18 1428 99 %     Weight 05/30/18 1430 120 lb (54.4 kg)     Height 05/30/18 1430 5\' 2"  (1.575 m)     Head Circumference --      Peak Flow --      Pain Score 05/30/18 1438 9     Pain Loc --      Pain Edu? --      Excl. in GC? --    Constitutional: Alert and oriented. Well appearing and in no distress. Eyes: Conjunctivae are normal. Normal extraocular movements. ENT      Head: Normocephalic and atraumatic.      Nose: No congestion/rhinnorhea.      Mouth/Throat: Mucous membranes are moist.      Neck: No stridor. Cardiovascular: Normal rate, regular rhythm. No murmurs, rubs, or gallops. Respiratory: Normal respiratory effort without tachypnea nor retractions. Breath sounds are clear and equal bilaterally. No wheezes/rales/rhonchi. Gastrointestinal: Right flank tenderness, no rebound or guarding.  Normal bowel sounds. Musculoskeletal: Nontender with normal range of motion in extremities. No lower extremity tenderness nor edema. Neurologic:  Normal speech and language. No gross focal neurologic deficits are appreciated.  Skin:  Skin is warm, dry and intact. No rash noted. Psychiatric: Mood and affect are normal. Speech and behavior are normal.  ____________________________________________  ED COURSE:  As part of my medical decision making, I reviewed the following data within the electronic MEDICAL RECORD NUMBER History obtained from family if available, nursing notes, old chart and ekg,  as well as notes from prior ED visits. Patient presented for flank pain and possible renal colic, we will assess with labs and imaging as indicated at this time.   Procedures ____________________________________________   LABS (pertinent positives/negatives)  Labs Reviewed  URINALYSIS, COMPLETE (UACMP) WITH MICROSCOPIC - Abnormal; Notable for the following components:      Result Value   Color, Urine YELLOW (*)    APPearance HAZY (*)    Ketones, ur 5 (*)    Bacteria, UA RARE (*)    All other components within normal limits  COMPREHENSIVE METABOLIC PANEL - Abnormal; Notable for the following components:   Calcium 8.8 (*)    All other components within normal limits  CBC WITH DIFFERENTIAL/PLATELET  POCT PREGNANCY, URINE  POC URINE PREG, ED    RADIOLOGY Images were viewed by me  Abdomen 2 view IMPRESSION: Negative. ____________________________________________   DIFFERENTIAL DIAGNOSIS   Muscle strain, renal colic, UTI, pyelonephritis, chronic pain  FINAL ASSESSMENT AND PLAN  Flank pain   Plan: The patient had presented for right flank pain. Patient's labs are reassuring. Patient's imaging did not reveal any acute process.  She does have tenderness near previous incision site from tubal ligation.  I will treat with NSAIDs and muscle relaxants.  She is cleared for outpatient follow-up.   Ulice Dash, MD    Note: This note was generated in part or whole with voice recognition software. Voice recognition is usually quite accurate but there are transcription errors that can and very often do occur. I apologize for any typographical errors that were not detected and corrected.     Emily Filbert, MD 05/30/18 [phone]

## 2018-05-30 NOTE — ED Triage Notes (Signed)
Pt to ed with c/o "I think I am passing kidney stones"  Pt with rock like object in baggie.  Pt reports hx of kidney stones,  States pain started about 4 days ago.

## 2018-12-03 ENCOUNTER — Other Ambulatory Visit: Payer: Self-pay

## 2018-12-03 ENCOUNTER — Emergency Department (HOSPITAL_COMMUNITY)
Admission: EM | Admit: 2018-12-03 | Discharge: 2018-12-03 | Disposition: A | Discharge status: Home / Self Care | Payer: Medicaid Other | Attending: Emergency Medicine | Admitting: Emergency Medicine

## 2018-12-03 DIAGNOSIS — R109 Unspecified abdominal pain: Secondary | ICD-10-CM | POA: Insufficient documentation

## 2018-12-03 DIAGNOSIS — Z5321 Procedure and treatment not carried out due to patient leaving prior to being seen by health care provider: Secondary | ICD-10-CM | POA: Insufficient documentation

## 2018-12-03 LAB — URINALYSIS, ROUTINE W REFLEX MICROSCOPIC
Bilirubin Urine: NEGATIVE
Glucose, UA: NEGATIVE mg/dL
Hgb urine dipstick: NEGATIVE
Ketones, ur: NEGATIVE mg/dL
Nitrite: NEGATIVE
Protein, ur: NEGATIVE mg/dL
Specific Gravity, Urine: 1.019 (ref 1.005–1.030)
pH: 5 (ref 5.0–8.0)

## 2018-12-03 LAB — BASIC METABOLIC PANEL
Anion gap: 12 (ref 5–15)
BUN: 16 mg/dL (ref 6–20)
CO2: 19 mmol/L — ABNORMAL LOW (ref 22–32)
Calcium: 9 mg/dL (ref 8.9–10.3)
Chloride: 108 mmol/L (ref 98–111)
Creatinine, Ser: 0.7 mg/dL (ref 0.44–1.00)
GFR calc Af Amer: >60 mL/min (ref >60)
GFR calc non Af Amer: >60 mL/min (ref >60)
Glucose, Bld: 107 mg/dL — ABNORMAL HIGH (ref 70–99)
Potassium: 3.3 mmol/L — ABNORMAL LOW (ref 3.5–5.1)
Sodium: 139 mmol/L (ref 135–145)

## 2018-12-03 LAB — CBC
HCT: 40.8 % (ref 36.0–46.0)
Hemoglobin: 13.5 g/dL (ref 12.0–15.0)
MCH: 32 pg (ref 26.0–34.0)
MCHC: 33.1 g/dL (ref 30.0–36.0)
MCV: 96.7 fL (ref 80.0–100.0)
Platelets: 176 10*3/uL (ref 150–400)
RBC: 4.22 MIL/uL (ref 3.87–5.11)
RDW: 12.9 % (ref 11.5–15.5)
WBC: 7.7 10*3/uL (ref 4.0–10.5)
nRBC: 0 % (ref 0.0–0.2)

## 2018-12-03 LAB — I-STAT BETA HCG BLOOD, ED (MC, WL, AP ONLY): I-stat hCG, quantitative: <5 m[IU]/mL (ref <5)

## 2018-12-03 NOTE — ED Triage Notes (Signed)
Pt presents with Right flank pain x4 days. Hx of kidney stones.

## 2018-12-03 NOTE — ED Triage Notes (Addendum)
Pt sleeping when I went in to collect labs, immediately started crying. Asking to put in a IV for pain medication instead of a straight stick.

## 2018-12-04 ENCOUNTER — Encounter (HOSPITAL_COMMUNITY): Payer: Self-pay | Admitting: *Deleted

## 2018-12-04 ENCOUNTER — Telehealth (HOSPITAL_COMMUNITY): Payer: Self-pay

## 2018-12-04 ENCOUNTER — Emergency Department (HOSPITAL_COMMUNITY)
Admission: EM | Admit: 2018-12-04 | Discharge: 2018-12-05 | Disposition: A | Discharge status: Home / Self Care | Payer: Medicaid Other | Attending: Emergency Medicine | Admitting: Emergency Medicine

## 2018-12-04 ENCOUNTER — Other Ambulatory Visit: Payer: Self-pay

## 2018-12-04 DIAGNOSIS — Z79899 Other long term (current) drug therapy: Secondary | ICD-10-CM | POA: Diagnosis not present

## 2018-12-04 DIAGNOSIS — N10 Acute tubulo-interstitial nephritis: Secondary | ICD-10-CM | POA: Diagnosis not present

## 2018-12-04 DIAGNOSIS — R1031 Right lower quadrant pain: Secondary | ICD-10-CM | POA: Diagnosis present

## 2018-12-04 DIAGNOSIS — N12 Tubulo-interstitial nephritis, not specified as acute or chronic: Secondary | ICD-10-CM

## 2018-12-04 DIAGNOSIS — F17210 Nicotine dependence, cigarettes, uncomplicated: Secondary | ICD-10-CM | POA: Diagnosis not present

## 2018-12-04 LAB — COMPREHENSIVE METABOLIC PANEL
ALT: 16 U/L (ref 0–44)
AST: 21 U/L (ref 15–41)
Albumin: 4.2 g/dL (ref 3.5–5.0)
Alkaline Phosphatase: 61 U/L (ref 38–126)
Anion gap: 10 (ref 5–15)
BUN: 11 mg/dL (ref 6–20)
CO2: 23 mmol/L (ref 22–32)
Calcium: 9.2 mg/dL (ref 8.9–10.3)
Chloride: 106 mmol/L (ref 98–111)
Creatinine, Ser: 0.75 mg/dL (ref 0.44–1.00)
GFR calc Af Amer: >60 mL/min (ref >60)
GFR calc non Af Amer: >60 mL/min (ref >60)
Glucose, Bld: 77 mg/dL (ref 70–99)
Potassium: 4 mmol/L (ref 3.5–5.1)
Sodium: 139 mmol/L (ref 135–145)
Total Bilirubin: 0.6 mg/dL (ref 0.3–1.2)
Total Protein: 7.5 g/dL (ref 6.5–8.1)

## 2018-12-04 LAB — CBC
HCT: 43.5 % (ref 36.0–46.0)
Hemoglobin: 14 g/dL (ref 12.0–15.0)
MCH: 32 pg (ref 26.0–34.0)
MCHC: 32.2 g/dL (ref 30.0–36.0)
MCV: 99.5 fL (ref 80.0–100.0)
Platelets: 190 10*3/uL (ref 150–400)
RBC: 4.37 MIL/uL (ref 3.87–5.11)
RDW: 13 % (ref 11.5–15.5)
WBC: 7.9 10*3/uL (ref 4.0–10.5)
nRBC: 0 % (ref 0.0–0.2)

## 2018-12-04 LAB — URINALYSIS, ROUTINE W REFLEX MICROSCOPIC
Bilirubin Urine: NEGATIVE
Glucose, UA: NEGATIVE mg/dL
Hgb urine dipstick: NEGATIVE
Ketones, ur: 5 mg/dL — AB
Nitrite: POSITIVE — AB
Protein, ur: NEGATIVE mg/dL
Specific Gravity, Urine: 1.021 (ref 1.005–1.030)
pH: 6 (ref 5.0–8.0)

## 2018-12-04 LAB — I-STAT BETA HCG BLOOD, ED (MC, WL, AP ONLY): I-stat hCG, quantitative: <5 m[IU]/mL (ref <5)

## 2018-12-04 LAB — LIPASE, BLOOD: Lipase: 24 U/L (ref 11–51)

## 2018-12-04 MED ORDER — OXYCODONE-ACETAMINOPHEN 5-325 MG PO TABS
1.0000 | ORAL_TABLET | ORAL | Status: AC | PRN
  Administered 2018-12-04 – 2018-12-05 (×2): 1 via ORAL
  Filled 2018-12-04 (×2): qty 1

## 2018-12-04 MED ORDER — SODIUM CHLORIDE 0.9% FLUSH
3.0000 mL | Freq: Once | INTRAVENOUS | Status: AC
  Administered 2018-12-05: 3 mL via INTRAVENOUS

## 2018-12-04 NOTE — ED Triage Notes (Signed)
Pt states R flank pain and burning with urination 4 days ago.  Pt was here last night but the wait was too long.  Hx of surgery to remove stones.  Pt appears to be in great pain.  Given 50 mcg of fentanyl en-route.

## 2018-12-05 LAB — URINE CULTURE: Culture: 90000 — AB

## 2018-12-05 MED ORDER — SODIUM CHLORIDE 0.9 % IV SOLN
1.0000 g | Freq: Once | INTRAVENOUS | Status: AC
  Administered 2018-12-05: 03:00:00 1 g via INTRAVENOUS
  Filled 2018-12-05: qty 10

## 2018-12-05 MED ORDER — CEPHALEXIN 500 MG PO CAPS: 500.0000 mg | ORAL_CAPSULE | Freq: Four times a day (QID) | ORAL | 0 refills | Status: DC

## 2018-12-05 MED ORDER — OXYCODONE-ACETAMINOPHEN 5-325 MG PO TABS
1.0000 | ORAL_TABLET | Freq: Once | ORAL | Status: DC
  Filled 2018-12-05: qty 1

## 2018-12-05 NOTE — Discharge Instructions (Addendum)
1. Medications: Keflex, usual home medications 2. Treatment: rest, drink plenty of fluids, take medications as prescribed 3. Follow Up: Please followup with your urology in 5-7 days for further evaluation of your recurrent urinary tract infections; return to the ER for fevers, persistent vomiting, worsening abdominal/flank pain or other concerning symptoms.

## 2018-12-05 NOTE — ED Notes (Signed)
Lab to collect urine culture from urine analysis sample. RN sending requisition to lab.

## 2018-12-05 NOTE — ED Notes (Signed)
Discharge instructions discussed with pt. Pt verbazlied understanding with no questions at this time.   Discussed PO antibiotics with pt. Pt verbalized understanding of need to take full dose. Pt asks if PA to will give prescription for narcotic pain medication. RN informed pt only antibiotics have been ordered. Pt stated "I guess I just have to get them off the street."   No other questions or concerns at this time.

## 2018-12-05 NOTE — ED Provider Notes (Signed)
[facility] EMERGENCY DEPARTMENT Provider Note   CSN: [id number] Arrival date & time: 12/04/18  1854     History   Chief Complaint Chief Complaint  Patient presents with  . Flank Pain  . Urinary Tract Infection    HPI Patty Mata is a 28 y.o. female with a hx of anxiety, nephrolithiasis, recurrent UTI presents to the Emergency Department complaining of gradual, persistent, progressively worsening suprapubic abdominal pain and associated urinary frequency and urgency onset 4 days ago.  Patient reports in the last 2 days the pain is moved into her right flank.  She reports the pain is constant and throbbing.  She denies dysuria or hematuria.  Patient reports a history of recurrent urinary tract infections without a known cause.  She reports her last kidney stone was approximately 9 months ago while she was pregnant.  Patient reports the pain is severe.  No specific aggravating or alleviating factors.  She denies fever, chills, headache, neck pain, chest pain, shortness of breath, vomiting, diarrhea, weakness, dizziness, syncope, vaginal discharge, vaginal bleeding.     The history is provided by the patient and medical records. No language interpreter was used.    Past Medical History:  Diagnosis Date  . Anxiety   . Kidney stones     Patient Active Problem List   Diagnosis Date Noted  . Cholestasis 03/03/2018  . Elevated liver function tests 02/19/2018  . No leakage of amniotic fluid into vagina 01/07/2018  . Rubella non-immune status, antepartum 11/04/2017  . Frequent UTI 10/30/2017  . Anxiety and depression 10/30/2017    Past Surgical History:  Procedure Laterality Date  . STENT PLACEMENT NON-VASCULAR ([facility] HX)     renal  . TUBAL LIGATION Bilateral 03/06/2018   Procedure: POST PARTUM TUBAL LIGATION;  Surgeon: Lesly Dukes, MD;  Location: [facility] BIRTHING SUITES;  Service: Gynecology;  Laterality: Bilateral;     OB History    Gravida  2   Para  2   Term  2   Preterm      AB      Living  2     SAB      TAB      Ectopic      Multiple  0   Live Births  2            Home Medications    Prior to Admission medications   Medication Sig Start Date End Date Taking? Authorizing Provider  amLODipine (NORVASC) 5 MG tablet Take 1 tablet (5 mg total) by mouth daily. Patient not taking: Reported on 12/05/2018 03/07/18   Tilda Burrow, MD  cephALEXin (KEFLEX) 500 MG capsule Take 1 capsule (500 mg total) by mouth 4 (four) times daily. 12/05/18   Kasra Melvin, Dahlia Client, PA-C  diazepam (VALIUM) 5 MG tablet Take 1 tablet (5 mg total) by mouth every 8 (eight) hours as needed for muscle spasms. Patient not taking: Reported on 12/05/2018 05/30/18   Emily Filbert, MD  HYDROcodone-acetaminophen (NORCO/VICODIN) 5-325 MG tablet Take 1-2 tablets by mouth every 6 (six) hours as needed for moderate pain. May take with ibuprofen Patient not taking: Reported on 12/05/2018 03/07/18   Tilda Burrow, MD  ibuprofen (ADVIL,MOTRIN) 600 MG tablet Take 1 tablet (600 mg total) by mouth every 6 (six) hours. Patient not taking: Reported on 12/05/2018 03/07/18   Tilda Burrow, MD  ibuprofen (ADVIL,MOTRIN) 800 MG tablet Take 1 tablet (800 mg total) by mouth every 8 (eight) hours as  needed. Patient not taking: Reported on 12/05/2018 05/30/18   Earleen Newport, MD  tamsulosin [facility]) 0.4 MG CAPS capsule Take 2 capsules (0.8 mg total) by mouth daily after breakfast. Patient not taking: Reported on 12/05/2018 02/20/18   Laury Deep, CNM    Family History Family History  Problem Relation Age of Onset  . Depression Mother   . Arthritis Mother   . Asthma Father   . Mental illness Father   . Cancer Maternal Grandfather     Social History Social History   Tobacco Use  . Smoking status: Current Every Day Smoker    Packs/day: 0.25    Years: 5.00    Pack years: 1.25    Types: Cigarettes  . Smokeless tobacco: Never Used  Substance Use Topics  .  Alcohol use: Not Currently    Comment: Socially; denies today  . 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  HENT: Negative for mouth sores.   Eyes: Negative for visual disturbance.  Respiratory: Negative for cough, chest tightness, shortness of breath and wheezing.   Cardiovascular: Negative for chest pain.  Gastrointestinal: Positive for abdominal pain. Negative for constipation, diarrhea, nausea and vomiting.  Endocrine: Negative for polydipsia, polyphagia and polyuria.  Genitourinary: Positive for flank pain and frequency. Negative for dysuria, hematuria, urgency, vaginal bleeding, vaginal discharge and vaginal pain.  Musculoskeletal: Negative for back pain and neck stiffness.  Skin: Negative for rash.  Allergic/Immunologic: Negative for immunocompromised state.  Neurological: Negative for syncope, light-headedness and headaches.  Hematological: Does not bruise/bleed easily.  Psychiatric/Behavioral: Negative for sleep disturbance. The patient is not nervous/anxious.      Physical Exam Updated Vital Signs BP (!) 132/101   Pulse 89   Temp 98.6 F (37 C) (Oral)   Resp 20   Ht 5\' 2"  (1.575 m)   Wt 54.4 kg   LMP 11/13/2018   SpO2 99%   BMI 21.95 kg/m   Physical Exam Vitals signs and nursing note reviewed.  Constitutional:      General: She is not in acute distress.    Appearance: She is not diaphoretic.  HENT:     Head: Normocephalic.  Eyes:     General: No scleral icterus.    Conjunctiva/sclera: Conjunctivae normal.  Neck:     Musculoskeletal: Normal range of motion.  Cardiovascular:     Rate and Rhythm: Normal rate and regular rhythm.     Pulses: Normal pulses.          Radial pulses are 2+ on the right side and  2+ on the left side.  Pulmonary:     Effort: No tachypnea, accessory muscle usage, prolonged expiration, respiratory distress or retractions.     Breath sounds: No stridor.     Comments: Equal chest rise. No increased work of breathing. Abdominal:     General: There is no distension.     Palpations: Abdomen is soft.     Tenderness: There is abdominal tenderness in the suprapubic area. There is right CVA tenderness. There is no left CVA tenderness, guarding or rebound.  Musculoskeletal:     Comments: Moves all extremities equally and without difficulty.  Skin:    General: Skin is warm and dry.     Capillary Refill: Capillary  refill takes less than 2 seconds.  Neurological:     Mental Status: She is alert.     GCS: GCS eye subscore is 4. GCS verbal subscore is 5. GCS motor subscore is 6.     Comments: Speech is clear and goal oriented.  Psychiatric:        Mood and Affect: Mood normal.      ED Treatments / Results  Labs (all labs ordered are listed, but only abnormal results are displayed) Labs Reviewed  URINALYSIS, ROUTINE W REFLEX MICROSCOPIC - Abnormal; Notable for the following components:      Result Value   APPearance HAZY (*)    Ketones, ur 5 (*)    Nitrite POSITIVE (*)    Leukocytes,Ua SMALL (*)    Bacteria, UA MANY (*)    All other components within normal limits  URINE CULTURE  LIPASE, BLOOD  COMPREHENSIVE METABOLIC PANEL  CBC  I-STAT BETA HCG BLOOD, ED ([facility], [facility], [facility] ONLY)     Procedures Procedures (including critical care time)  Medications Ordered in ED Medications  oxyCODONE-acetaminophen (PERCOCET/ROXICET) 5-325 MG per tablet 1 tablet (1 tablet Oral Not Given 12/05/18 0315)  sodium chloride flush (NS) 0.9 % injection 3 mL (3 mLs Intravenous Given 12/05/18 0236)  oxyCODONE-acetaminophen (PERCOCET/ROXICET) 5-325 MG per tablet 1 tablet (1 tablet Oral Given 12/05/18 0312)  cefTRIAXone (ROCEPHIN) 1 g in sodium chloride 0.9 % 100 mL IVPB (0 g Intravenous Stopped  12/05/18 0413)     Initial Impression / Assessment and Plan / ED Course  I have reviewed the triage vital signs and the nursing notes.  Pertinent labs & imaging results that were available during my care of the patient were reviewed by me and considered in my medical decision making (see chart for details).        Patient presents with urinary symptoms and flank pain.  She has no leukocytosis, is without fever tachycardia or hypotension.  No evidence of sepsis.  Pregnancy test negative.  Urinalysis does show nitrites, leukocytes and has a hazy appearance.  There is no blood in her urine.  Suspect pyelonephritis.  Less likely to be nephrolithiasis/renal colic.  Patient is well-appearing on exam.  She has never been evaluated by urology.  Will give urology follow-up.  4:38 AM Patient reports she is feeling much better.  She has received her antibiotics.  She is tolerated p.o. without emesis.  She wishes for discharge home.  Less likely to be infected stone however we have discussed this possibility.  Strict return precautions given.  She will need close urology follow-up.  Patient states understanding and is in agreement with the plan.  The patient was discussed with Dr. Rhunette CroftNanavati who agrees with the treatment plan.   Final Clinical Impressions(s) / ED Diagnoses   Final diagnoses:  Pyelonephritis    ED Discharge Orders         Ordered    cephALEXin (KEFLEX) 500 MG capsule  4 times daily     12/05/18 0419           Nani Ingram, Dahlia ClientHannah, PA-C 12/05/18 [id number]    Derwood KaplanNanavati, Ankit, MD 12/05/18 [id number]

## 2018-12-06 ENCOUNTER — Telehealth: Payer: Self-pay

## 2018-12-06 NOTE — Telephone Encounter (Signed)
Post ED Visit - Positive Culture Follow-up  Culture report reviewed by antimicrobial stewardship pharmacist: [place] Team []  Elenor Quinones, Pharm.D. []  Heide Guile, Pharm.D., BCPS AQ-ID []  Parks Neptune, Pharm.D., BCPS []  Alycia Rossetti, Pharm.D., BCPS []  [place], Pharm.D., BCPS, AAHIVP []  Legrand Como, Pharm.D., BCPS, AAHIVP []  Salome Arnt, PharmD, BCPS []  Johnnette Gourd, PharmD, BCPS [x]  Hughes Better, PharmD, BCPS []  Leeroy Cha, PharmD []  Laqueta Linden, PharmD, BCPS []  Albertina Parr, PharmD  [place] Team []  Leodis Sias, PharmD []  Lindell Spar, PharmD []  Royetta Asal, PharmD []  Graylin Shiver, Rph []  Rema Fendt) Glennon Mac, PharmD []  Arlyn Dunning, PharmD []  Netta Cedars, PharmD []  Dia Sitter, PharmD []  Leone Haven, PharmD []  Gretta Arab, PharmD []  Theodis Shove, PharmD []  Peggyann Juba, PharmD []  Reuel Boom, PharmD   Positive urine culture Treated with Cephalexin, organism sensitive to the same and no further patient follow-up is required at this time.  Genia Del 12/06/2018, 10:51 AM

## 2018-12-07 LAB — URINE CULTURE: Culture: >=100000 — AB

## 2018-12-08 ENCOUNTER — Telehealth: Payer: Self-pay | Admitting: Emergency Medicine

## 2018-12-08 NOTE — Telephone Encounter (Signed)
Post ED Visit - Positive Culture Follow-up  Culture report reviewed by antimicrobial stewardship pharmacist: [place] Team []  Elenor Quinones, Pharm.D. []  Heide Guile, Pharm.D., BCPS AQ-ID []  Parks Neptune, Pharm.D., BCPS []  Alycia Rossetti, Pharm.D., BCPS []  [place], [place].D., BCPS, AAHIVP []  Legrand Como, Pharm.D., BCPS, AAHIVP []  Salome Arnt, PharmD, BCPS []  Johnnette Gourd, PharmD, BCPS []  Hughes Better, PharmD, BCPS []  Leeroy Cha, PharmD []  Laqueta Linden, PharmD, BCPS []  Albertina Parr, PharmD Agnes Lawrence PharmD  [place] Team []  Leodis Sias, PharmD []  Lindell Spar, PharmD []  Royetta Asal, PharmD []  Graylin Shiver, Rph []  Rema Fendt) Glennon Mac, PharmD []  Arlyn Dunning, PharmD []  Netta Cedars, PharmD []  Dia Sitter, PharmD []  Leone Haven, PharmD []  Gretta Arab, PharmD []  Theodis Shove, PharmD []  Peggyann Juba, PharmD []  Reuel Boom, PharmD   Positive urine culture Treated with cephalexin, organism sensitive to the same and no further patient follow-up is required at this time.  Hazle Nordmann 12/08/2018, 11:53 AM

## 2019-03-15 ENCOUNTER — Encounter (HOSPITAL_COMMUNITY): Payer: Self-pay | Admitting: Emergency Medicine

## 2019-03-15 ENCOUNTER — Emergency Department (HOSPITAL_COMMUNITY)
Admission: EM | Admit: 2019-03-15 | Discharge: 2019-03-15 | Disposition: A | Discharge status: Home / Self Care | Payer: Medicaid Other | Attending: Emergency Medicine | Admitting: Emergency Medicine

## 2019-03-15 ENCOUNTER — Other Ambulatory Visit: Payer: Self-pay

## 2019-03-15 DIAGNOSIS — T50901A Poisoning by unspecified drugs, medicaments and biological substances, accidental (unintentional), initial encounter: Secondary | ICD-10-CM

## 2019-03-15 DIAGNOSIS — F17210 Nicotine dependence, cigarettes, uncomplicated: Secondary | ICD-10-CM | POA: Diagnosis not present

## 2019-03-15 DIAGNOSIS — T50901D Poisoning by unspecified drugs, medicaments and biological substances, accidental (unintentional), subsequent encounter: Secondary | ICD-10-CM | POA: Diagnosis not present

## 2019-03-15 LAB — CBC WITH DIFFERENTIAL/PLATELET
Abs Immature Granulocytes: 0.01 10*3/uL (ref 0.00–0.07)
Basophils Absolute: 0.1 10*3/uL (ref 0.0–0.1)
Basophils Relative: 1 %
Eosinophils Absolute: 0.3 10*3/uL (ref 0.0–0.5)
Eosinophils Relative: 5 %
HCT: 44.7 % (ref 36.0–46.0)
Hemoglobin: 14.2 g/dL (ref 12.0–15.0)
Immature Granulocytes: 0 %
Lymphocytes Relative: 21 %
Lymphs Abs: 1.4 10*3/uL (ref 0.7–4.0)
MCH: 31.1 pg (ref 26.0–34.0)
MCHC: 31.8 g/dL (ref 30.0–36.0)
MCV: 97.8 fL (ref 80.0–100.0)
Monocytes Absolute: 0.3 10*3/uL (ref 0.1–1.0)
Monocytes Relative: 5 %
Neutro Abs: 4.6 10*3/uL (ref 1.7–7.7)
Neutrophils Relative %: 68 %
Platelets: 173 10*3/uL (ref 150–400)
RBC: 4.57 MIL/uL (ref 3.87–5.11)
RDW: 12.2 % (ref 11.5–15.5)
WBC: 6.7 10*3/uL (ref 4.0–10.5)
nRBC: 0 % (ref 0.0–0.2)

## 2019-03-15 LAB — I-STAT BETA HCG BLOOD, ED (MC, WL, AP ONLY): I-stat hCG, quantitative: <5 m[IU]/mL (ref <5)

## 2019-03-15 LAB — COMPREHENSIVE METABOLIC PANEL
ALT: 14 U/L (ref 0–44)
AST: 16 U/L (ref 15–41)
Albumin: 4.3 g/dL (ref 3.5–5.0)
Alkaline Phosphatase: 53 U/L (ref 38–126)
Anion gap: 11 (ref 5–15)
BUN: 14 mg/dL (ref 6–20)
CO2: 25 mmol/L (ref 22–32)
Calcium: 9.3 mg/dL (ref 8.9–10.3)
Chloride: 105 mmol/L (ref 98–111)
Creatinine, Ser: 0.72 mg/dL (ref 0.44–1.00)
GFR calc Af Amer: >60 mL/min (ref >60)
GFR calc non Af Amer: >60 mL/min (ref >60)
Glucose, Bld: 89 mg/dL (ref 70–99)
Potassium: 4.2 mmol/L (ref 3.5–5.1)
Sodium: 141 mmol/L (ref 135–145)
Total Bilirubin: 0.4 mg/dL (ref 0.3–1.2)
Total Protein: 7.4 g/dL (ref 6.5–8.1)

## 2019-03-15 LAB — RAPID URINE DRUG SCREEN, HOSP PERFORMED
Amphetamines: POSITIVE — AB
Barbiturates: NOT DETECTED
Benzodiazepines: POSITIVE — AB
Cocaine: NOT DETECTED
Opiates: POSITIVE — AB
Tetrahydrocannabinol: POSITIVE — AB

## 2019-03-15 LAB — ACETAMINOPHEN LEVEL: Acetaminophen (Tylenol), Serum: <10 ug/mL — ABNORMAL LOW (ref 10–30)

## 2019-03-15 LAB — SALICYLATE LEVEL: Salicylate Lvl: <7 mg/dL (ref 2.8–30.0)

## 2019-03-15 LAB — ETHANOL: Alcohol, Ethyl (B): 21 mg/dL — ABNORMAL HIGH (ref <10)

## 2019-03-15 NOTE — ED Provider Notes (Signed)
[facility] DEPT Provider Note   CSN: [id number] Arrival date & time: 03/15/19  0045     History Chief Complaint  Patient presents with  . Drug Overdose    Patty Mata is a 28 y.o. female.  The history is provided by the patient and medical records.    28 y.o. F with hx of anxiety, kidney stones, presenting to the ED after accidental drug OD.  States she has been on xanax for several years now, however her PCP that was prescribed them "got popped" so lost his medical license.  States since that time she has been getting her xanax off the street and from friends.  Today she was feeling really stressed out and took a "green bar of Xanax" to try to relax.    EMS was called due to unresponsiveness, responded to 2 mg intranasal Narcan.  On arrival to ED, patient remains drowsy but able to converse and give history.  She adamantly denies any intent of self-harm.  States she got this particular Xanax from a friend so does not think it was laced with anything.  She denies any other coingestion or alcohol use.  Past Medical History:  Diagnosis Date  . Anxiety   . Kidney stones     Patient Active Problem List   Diagnosis Date Noted  . Cholestasis 03/03/2018  . Elevated liver function tests 02/19/2018  . No leakage of amniotic fluid into vagina 01/07/2018  . Rubella non-immune status, antepartum 11/04/2017  . Frequent UTI 10/30/2017  . Anxiety and depression 10/30/2017    Past Surgical History:  Procedure Laterality Date  . STENT PLACEMENT NON-VASCULAR ([place] HX)     renal  . TUBAL LIGATION Bilateral 03/06/2018   Procedure: POST PARTUM TUBAL LIGATION;  Surgeon: Guss Bunde, MD;  Location: [place];  Service: Gynecology;  Laterality: Bilateral;     OB History    Gravida  2   Para  2   Term  2   Preterm      AB      Living  2     SAB      TAB      Ectopic      Multiple  0   Live Births  2           Family  History  Problem Relation Age of Onset  . Depression Mother   . Arthritis Mother   . Asthma Father   . Mental illness Father   . Cancer Maternal Grandfather     Social History   Tobacco Use  . Smoking status: Current Every Day Smoker    Packs/day: 0.25    Years: 5.00    Pack years: 1.25    Types: Cigarettes  . Smokeless tobacco: Never Used  Substance Use Topics  . Alcohol use: Not Currently    Comment: Socially; denies today  . 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 Authorizing Provider  amLODipine (NORVASC) 5 MG tablet Take 1 tablet (5 mg total) by mouth daily. Patient not taking: Reported on 12/05/2018 03/07/18   Jonnie Kind, MD  cephALEXin (KEFLEX) 500 MG capsule Take 1 capsule (500 mg total) by mouth 4 (four) times daily. 12/05/18   Muthersbaugh, Jarrett Soho, PA-C  diazepam (VALIUM) 5 MG tablet Take  1 tablet (5 mg total) by mouth every 8 (eight) hours as needed for muscle spasms. Patient not taking: Reported on 12/05/2018 05/30/18   Emily FilbertWilliams, Jonathan E, MD  HYDROcodone-acetaminophen (NORCO/VICODIN) 5-325 MG tablet Take 1-2 tablets by mouth every 6 (six) hours as needed for moderate pain. May take with ibuprofen Patient not taking: Reported on 12/05/2018 03/07/18   Tilda BurrowFerguson, John V, MD  ibuprofen (ADVIL,MOTRIN) 600 MG tablet Take 1 tablet (600 mg total) by mouth every 6 (six) hours. Patient not taking: Reported on 12/05/2018 03/07/18   Tilda BurrowFerguson, John V, MD  ibuprofen (ADVIL,MOTRIN) 800 MG tablet Take 1 tablet (800 mg total) by mouth every 8 (eight) hours as needed. Patient not taking: Reported on 12/05/2018 05/30/18   Emily FilbertWilliams, Jonathan E, MD  tamsulosin [facility](FLOMAX) 0.4 MG CAPS capsule Take 2 capsules (0.8 mg total) by mouth daily after breakfast. Patient not taking: Reported on 12/05/2018 02/20/18   Raelyn Moraawson, Rolitta, CNM     Allergies    Penicillins  Review of Systems   Review of Systems  Constitutional:       OD  All other systems reviewed and are negative.   Physical Exam Updated Vital Signs BP (!) 130/102 (BP Location: Left Arm)   Pulse 76   Temp 97.9 F (36.6 C) (Oral)   Resp (!) 8   SpO2 100%   Physical Exam Vitals and nursing note reviewed.  Constitutional:      Appearance: She is well-developed.     Comments: Drowsy but arouses to voice and tactile stimuli, able to help undress herself, NAD  HENT:     Head: Normocephalic and atraumatic.  Eyes:     Conjunctiva/sclera: Conjunctivae normal.     Pupils: Pupils are equal, round, and reactive to light.  Cardiovascular:     Rate and Rhythm: Normal rate and regular rhythm.     Heart sounds: Normal heart sounds.  Pulmonary:     Effort: Pulmonary effort is normal. No respiratory distress.     Breath sounds: Normal breath sounds. No rhonchi.  Abdominal:     General: Bowel sounds are normal.     Palpations: Abdomen is soft.     Tenderness: There is no abdominal tenderness.     Hernia: No hernia is present.  Musculoskeletal:        General: Normal range of motion.     Cervical back: Normal range of motion.  Skin:    General: Skin is warm and dry.  Neurological:     Mental Status: She is alert and oriented to person, place, and time.     ED Results / Procedures / Treatments   Labs (all labs ordered are listed, but only abnormal results are displayed) Labs Reviewed  ETHANOL - Abnormal; Notable for the following components:      Result Value   Alcohol, Ethyl (B) 21 (*)    All other components within normal limits  RAPID URINE DRUG SCREEN, HOSP PERFORMED - Abnormal; Notable for the following components:   Opiates POSITIVE (*)    Benzodiazepines POSITIVE (*)    Amphetamines POSITIVE (*)    Tetrahydrocannabinol POSITIVE (*)    All other components within normal limits  ACETAMINOPHEN LEVEL - Abnormal; Notable for the following  components:   Acetaminophen (Tylenol), Serum <10 (*)    All other components within normal limits  CBC WITH DIFFERENTIAL/PLATELET  COMPREHENSIVE METABOLIC PANEL  SALICYLATE LEVEL  I-STAT BETA HCG BLOOD, ED ([facility], [facility], [facility] ONLY)    EKG None  Radiology No  results found.  Procedures Procedures (including critical care time)  Medications Ordered in ED Medications - No data to display  ED Course  I have reviewed the triage vital signs and the nursing notes.  Pertinent labs & imaging results that were available during my care of the patient were reviewed by me and considered in my medical decision making (see chart for details).    MDM Rules/Calculators/A&P  28 year old female here after reported overdose.  States she took a "green bar" prior to arrival.  She has been prescribed Xanax for several years but her PCP who was prescribing them "got popped" and lost their license.  States now she is getting her Xanax off the street and from friends.  States she was feeling very stressed out tonight and took a Xanax from her friend.  EMS was called due to unresponsiveness.  She was given 2 mg intranasal Narcan with good improvement.  She adamantly denies any other coingestions.  She denies any attempts at self-harm.  Vitals are currently stable on room air, she remains drowsy but arouses appropriately and is conversant during exam.  Will send screening labs, obtain UDS.   She arrived on nonrebreather, however is maintaining saturations well without this.  Will monitor closely.   1:37 AM VS remain stable on RA.  RR now improved to around 12.  Will monitor.  Labs still pending.  3:27 AM Patient now awake, alert, talking on phone and drinking PO fluids.  Labs reviewed, grossly reassuring.  UDS + for THC, opiates, benzo's, and amphetamines.  Ethanol 21.  She now admits that the xanax she bought did "taste different" than normal.  She continues to deny and SI or intent of self harm.  Has ride here that  will take her home.  Advised against illicit drug use and taking prescription medications not prescribed to her.  She will need to establish care with new PCP.  Return here for any new/acute changes.  Final Clinical Impression(s) / ED Diagnoses Final diagnoses:  Accidental drug overdose, initial encounter    Rx / DC Orders ED Discharge Orders    None       Garlon Hatchet, PA-C 03/15/19 0419    Zadie Rhine, MD 03/15/19 [phone]

## 2019-03-15 NOTE — ED Notes (Signed)
Pt ambulated out of the ED with a steady gait and under her own power.

## 2019-03-15 NOTE — ED Triage Notes (Signed)
Pt presents by [facility] for overdose on unknown substance. EMS reported that pt stated it was Xanax but was responsive to narcan 2mg  IN. EMS reports that pt was initially agonal on arrival of EMS. Pt now on 15L NRB and 100%.

## 2019-03-15 NOTE — Discharge Instructions (Addendum)
Abstain from illicit drug use.  Do not take any prescription medications that are not prescribed to you. Return here for any new/acute changes.

## 2019-03-15 NOTE — ED Notes (Signed)
Pt alert and oriented x 4 at this time and able to ambulate. Pt reports family is awaiting her outside for discharge.

## 2019-07-13 ENCOUNTER — Encounter (HOSPITAL_COMMUNITY): Payer: Self-pay | Admitting: *Deleted

## 2019-07-13 ENCOUNTER — Other Ambulatory Visit: Payer: Self-pay

## 2019-07-13 DIAGNOSIS — K5900 Constipation, unspecified: Secondary | ICD-10-CM | POA: Diagnosis present

## 2019-07-13 DIAGNOSIS — F17210 Nicotine dependence, cigarettes, uncomplicated: Secondary | ICD-10-CM | POA: Insufficient documentation

## 2019-07-13 DIAGNOSIS — Z79899 Other long term (current) drug therapy: Secondary | ICD-10-CM | POA: Insufficient documentation

## 2019-07-13 NOTE — ED Triage Notes (Signed)
Pt reporting constipation, LBM last week. She says that she has used several OTC methods without relief. Nausea. Also, last urinated approx 24 hours ago. Bladder scan .

## 2019-07-14 ENCOUNTER — Emergency Department (HOSPITAL_COMMUNITY)
Admission: EM | Admit: 2019-07-14 | Discharge: 2019-07-14 | Disposition: A | Discharge status: Home / Self Care | Payer: Medicaid Other | Attending: Emergency Medicine | Admitting: Emergency Medicine

## 2019-07-14 ENCOUNTER — Emergency Department (HOSPITAL_COMMUNITY): Payer: Medicaid Other

## 2019-07-14 DIAGNOSIS — K5900 Constipation, unspecified: Secondary | ICD-10-CM

## 2019-07-14 LAB — I-STAT BETA HCG BLOOD, ED (MC, WL, AP ONLY): I-stat hCG, quantitative: <5 m[IU]/mL (ref <5)

## 2019-07-14 MED ORDER — DOCUSATE SODIUM 100 MG PO CAPS: 100.0000 mg | ORAL_CAPSULE | Freq: Two times a day (BID) | ORAL | 0 refills | Status: DC

## 2019-07-14 MED ORDER — MILK AND MOLASSES ENEMA
1.0000 | Freq: Once | RECTAL | Status: AC
  Administered 2019-07-14: 240 mL via RECTAL
  Filled 2019-07-14: qty 240

## 2019-07-14 MED ORDER — POLYETHYLENE GLYCOL 3350 17 GM/SCOOP PO POWD: 17.0000 g | Freq: Every day | ORAL | 0 refills | Status: DC

## 2019-07-14 MED ORDER — GLYCERIN (ADULT) 2 G RE SUPP: 1.0000 | RECTAL | 0 refills | Status: DC | PRN

## 2019-07-14 NOTE — ED Notes (Signed)
Patient had a BM in bathroom and pooped on toilet, on floor, and put papertowels in toilet. Patient stated to RN "I don't want anyone to touch me, my asshole hurts" Patient is stating she does not want to walk from bathroom to room. Patient is reported she just wants to sleep.

## 2019-07-14 NOTE — ED Notes (Signed)
While giving pt an enema, pt began smacking RN Holly's hand away. Pt encouraged to use her words and not hit staff if she felt uncomfortable and needed the procedure to be stopped. Pt agreeable to continue. Once complete, pt ambulated to BR to attempt a BM with steady gait.

## 2019-07-14 NOTE — ED Notes (Signed)
Pt walking around lobby drinking a 20 oz bottle of water

## 2019-07-14 NOTE — ED Notes (Signed)
Patient still in the restroom, verbalized to this nurse and NT Sam she was okay and still attempting to have a bowel movement.

## 2019-07-14 NOTE — ED Notes (Signed)
Pt states she is unable to provide urine sample at this time.

## 2019-07-14 NOTE — ED Notes (Signed)
Patient states she is unable to give a urine sample at this time 

## 2019-07-14 NOTE — ED Notes (Signed)
Pt still in the BR at this time. Upon checking on pt, pt declines needing anything and is still attempting to have a BM.

## 2019-07-14 NOTE — ED Notes (Signed)
Patient given DC paperwork and signed the signature pad, encouraged to get dressed. 10 min later, patient had not moved. Patient provided with paper scrubs and encouraged to get dressed, as no movement had been made.

## 2019-07-14 NOTE — ED Provider Notes (Signed)
[facility]-EMERGENCY DEPT Provider Note   CSN: [id number] Arrival date & time: 07/13/19  1935     History Chief Complaint  Patient presents with  . Constipation    Patty Mata is a 29 y.o. female.   Constipation Severity:  Moderate Time since last bowel movement:  1 week Timing:  Constant Progression:  Unchanged Chronicity:  Recurrent Stool description:  None produced Relieved by:  Nothing Ineffective treatments: Suppository, home enema, Mag Citrate. Associated symptoms: abdominal pain (cramping, generalized), flatus and nausea   Associated symptoms: no diarrhea, no fever and no vomiting   Abdominal pain:    Location:  Generalized   Quality: cramping     Severity:  Mild   Timing:  Constant   Progression:  Unchanged   Chronicity:  New     Past Medical History:  Diagnosis Date  . Anxiety   . Kidney stones     Patient Active Problem List   Diagnosis Date Noted  . Cholestasis 03/03/2018  . Elevated liver function tests 02/19/2018  . No leakage of amniotic fluid into vagina 01/07/2018  . Rubella non-immune status, antepartum 11/04/2017  . Frequent UTI 10/30/2017  . Anxiety and depression 10/30/2017    Past Surgical History:  Procedure Laterality Date  . STENT PLACEMENT NON-VASCULAR ([facility] HX)     renal  . TUBAL LIGATION Bilateral 03/06/2018   Procedure: POST PARTUM TUBAL LIGATION;  Surgeon: Lesly Dukes, MD;  Location: [facility] BIRTHING SUITES;  Service: Gynecology;  Laterality: Bilateral;     OB History    Gravida  2   Para  2   Term  2   Preterm      AB      Living  2     SAB      TAB      Ectopic      Multiple  0   Live Births  2           Family History  Problem Relation Age of Onset  . Depression Mother   . Arthritis Mother   . Asthma Father   . Mental illness Father   . Cancer Maternal Grandfather     Social History   Tobacco Use  . Smoking status: Current Every Day Smoker    Packs/day: 0.25    Years: 5.00    Pack years: 1.25    Types: Cigarettes  . Smokeless tobacco: Never Used  Substance Use Topics  . Alcohol use: Not Currently    Comment: Socially; denies today  . 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 Authorizing Provider  alprazolam Prudy Feeler) 2 MG tablet Take 2 mg by mouth at bedtime as needed for sleep.   Yes [provider]  amLODipine (NORVASC) 5 MG tablet Take 1 tablet (5 mg total) by mouth daily. Patient not taking: Reported on 12/05/2018 03/07/18   Tilda Burrow, MD  cephALEXin (KEFLEX) 500 MG capsule Take 1 capsule (500 mg total) by mouth 4 (four) times daily. Patient not taking: Reported on 07/14/2019 12/05/18   Muthersbaugh, Dahlia Client, PA-C  diazepam (VALIUM) 5 MG tablet Take 1 tablet (5 mg total) by mouth every 8 (eight) hours as needed for muscle spasms. Patient not taking: Reported on 12/05/2018 05/30/18   Emily Filbert, MD  HYDROcodone-acetaminophen (NORCO/VICODIN) [phone]  MG tablet Take 1-2 tablets by mouth every 6 (six) hours as needed for moderate pain. May take with ibuprofen Patient not taking: Reported on 12/05/2018 03/07/18   Jonnie Kind, MD  ibuprofen (ADVIL,MOTRIN) 600 MG tablet Take 1 tablet (600 mg total) by mouth every 6 (six) hours. Patient not taking: Reported on 12/05/2018 03/07/18   Jonnie Kind, MD  ibuprofen (ADVIL,MOTRIN) 800 MG tablet Take 1 tablet (800 mg total) by mouth every 8 (eight) hours as needed. Patient not taking: Reported on 12/05/2018 05/30/18   Earleen Newport, MD  tamsulosin [facility]) 0.4 MG CAPS capsule Take 2 capsules (0.8 mg total) by mouth daily after breakfast. Patient not taking: Reported on 12/05/2018 02/20/18   Laury Deep, CNM    Allergies    Penicillins  Review of Systems   Review of Systems  Constitutional: Negative for  fever.  Gastrointestinal: Positive for abdominal pain (cramping, generalized), constipation, flatus and nausea. Negative for diarrhea and vomiting.  Ten systems reviewed and are negative for acute change, except as noted in the HPI.    Physical Exam Updated Vital Signs BP 99/73 (BP Location: Right Arm)   Pulse 66   Temp 97.9 F (36.6 C) (Oral)   Resp 12   LMP 06/12/2019   SpO2 98%   Physical Exam Vitals and nursing note reviewed.  Constitutional:      General: She is not in acute distress.    Appearance: She is well-developed. She is not diaphoretic.     Comments: Nontoxic appearing and in NAD  HENT:     Head: Normocephalic and atraumatic.  Eyes:     General: No scleral icterus.    Conjunctiva/sclera: Conjunctivae normal.  Pulmonary:     Effort: Pulmonary effort is normal. No respiratory distress.     Comments: Respirations even and unlabored Abdominal:     Comments: Soft, nondistended abdomen. No peritoneal signs.  Genitourinary:    Comments: Small amount of soft stool in rectal vault. No large volume fecal impaction. No external hemorrhoids or anal fissure. Musculoskeletal:        General: Normal range of motion.     Cervical back: Normal range of motion.  Skin:    General: Skin is warm and dry.     Coloration: Skin is not pale.     Findings: No erythema or rash.  Neurological:     Mental Status: She is alert and oriented to person, place, and time.  Psychiatric:        Behavior: Behavior normal.     ED Results / Procedures / Treatments   Labs (all labs ordered are listed, but only abnormal results are displayed) Labs Reviewed  URINALYSIS, ROUTINE W REFLEX MICROSCOPIC  I-STAT BETA HCG BLOOD, ED ([facility], [facility], [facility] ONLY)    EKG None  Radiology DG Abd 2 Views  Result Date: 07/14/2019 CLINICAL DATA:  29 year old female with history of constipation for 1 week. EXAM: ABDOMEN - 2 VIEW COMPARISON:  Abdominal radiograph 05/30/2018. FINDINGS: Gas and stool are seen  scattered throughout the colon extending to the level of the distal rectum. No pathologic distension of small bowel is noted. There is a moderate stool burden in the rectum, but most of the colon contains predominantly gas. No gross evidence of pneumoperitoneum. IMPRESSION: 1. Nonobstructive bowel gas pattern. Moderate stool burden in the rectum which may suggest mild constipation. 2. No pneumoperitoneum. Electronically Signed   By: [name] M.D.   On: 07/14/2019 06:00    Procedures Procedures (  including critical care time)  Medications Ordered in ED Medications  milk and molasses enema (240 mLs Rectal Given 07/14/19 1155)    ED Course  I have reviewed the triage vital signs and the nursing notes.  Pertinent labs & imaging results that were available during my care of the patient were reviewed by me and considered in my medical decision making (see chart for details).  Clinical Course as of Jul 14 630  Wed Jul 14, 2019  2080 X-rays reviewed by me.  Large amount of stool in the sigmoid colon and rectum.  As patient with no fecal impaction on exam, will proceed with milk and molasses enema.  This can be followed by a soapsuds enema if no immediate improvement.   [KH]    Clinical Course User Index [KH] Darylene Price   MDM Rules/Calculators/A&P                      29 year old female presents to the emergency department for evaluation of constipation.  She states that her last bowel movement was 1 week ago.  She has been passing flatus and feels nauseous, but has never vomited.  Her abdominal exam is soft, nondistended.  She has generalized tenderness without focality.  This is minimal.  No fecal impaction on DRE.  Noted to have large stool volume in her distal colon and rectum.  Pending enema to help with passage of stool.  Care signed out to [place], PA-C at shift change pending reassessment.  Anticipate discharge on Colace and MiraLAX; addition of suppository may be  reasonable.   Final Clinical Impression(s) / ED Diagnoses Final diagnoses:  Constipation    Rx / DC Orders ED Discharge Orders    None       Antony Madura, PA-C 07/14/19 2233    Paula Libra, MD 07/14/19 0710

## 2019-07-14 NOTE — ED Provider Notes (Signed)
8:01 AM signout from [facility] at shift change.  Patient with constipation.  This is demonstrated on x-ray.  Patient has had history of same.  Patient was given an enema.  She has had a large bowel movement.  She continues to complain of some abdominal cramping and trouble urinating.  I examined the patient.  Her abdomen is soft on exam.  No distention.  I encouraged her to continue drinking water.  I would like her to urinate prior to discharge.  Plan on discharge to home with MiraLAX, Colace, glycerin suppositories.  BP 99/73 (BP Location: Right Arm)   Pulse 66   Temp 97.9 F (36.6 C) (Oral)   Resp 12   LMP 06/12/2019   SpO2 98%   9:56 AM Patient has been unwilling to attempt to urinate. NT checked bladder scan and it was 169 cc.  I do not feel that the patient needs catheterization at this point.  She is making urine.  She has been in the emergency department for 14 hours and 22 minutes.  I do not feel that she has a significant bladder obstruction.  We will discharged home with plan as above.  Encourage patient to return with worsening or changing symptoms.    Renne Crigler, PA-C 07/14/19 0957    Gerhard Munch, MD 07/14/19 1018

## 2019-07-14 NOTE — ED Notes (Signed)
Pt sleeping at this time.

## 2019-07-14 NOTE — ED Notes (Signed)
Pt finally dressed ambulatory upon leaving.

## 2019-07-14 NOTE — Discharge Instructions (Signed)
Please read and follow all provided instructions.  Your diagnoses today include:  1. Constipation     Tests performed today include:  X-ray of your abdomen that shows a large amount of stool in your abdomen  Vital signs. See below for your results today.   Medications prescribed:   Colace - stool softener  This medication can be found over-the-counter.   Continue colace for 2 weeks after your stools return to normal to prevent constipation.    Miralax - laxative  This medication can be found over-the-counter.   Take any medications only as directed on prescription or on packaging.   Home care instructions:  Follow any educational materials contained in this packet.  Follow-up instructions: Please follow-up with your primary care provider in the next week for further evaluation of your symptoms.   Return instructions:   Please return to the Emergency Department if you experience worsening symptoms.   Please return if you have worsening abdominal pain, swelling of your abdomen, persistent vomiting, blood in your stool or vomit, or fever.   Please return if you have any other emergent concerns.  Additional Information:  Your vital signs today were: BP 99/73 (BP Location: Right Arm)   Pulse 66   Temp 97.9 F (36.6 C) (Oral)   Resp 12   LMP 06/12/2019   SpO2 98%  If your blood pressure (BP) was elevated above 135/85 this visit, please have this repeated by your doctor within one month. --------------

## 2019-07-14 NOTE — ED Notes (Signed)
Patient pushed this nurses hand away when inserting the enema, this nurse reminded patient if she needs me to stop to please tell me rather than pushing my hand away.

## 2019-09-11 IMAGING — US US RENAL
1 series · 14 of 23 positions shown · non-contrast
Comparison: CT 03/26/2017

CLINICAL DATA: Right flank pain. History of kidney stones treated
with lithotripsy.

EXAM:
RENAL / URINARY TRACT ULTRASOUND COMPLETE

[Series 1: us renal · 0.19mm/px · 14 of 23 slices shown]
[im 1/23]
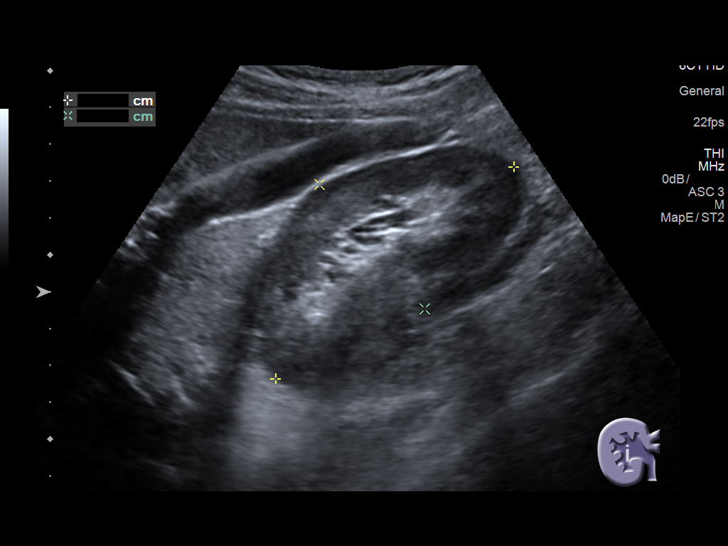
[im 3/23]
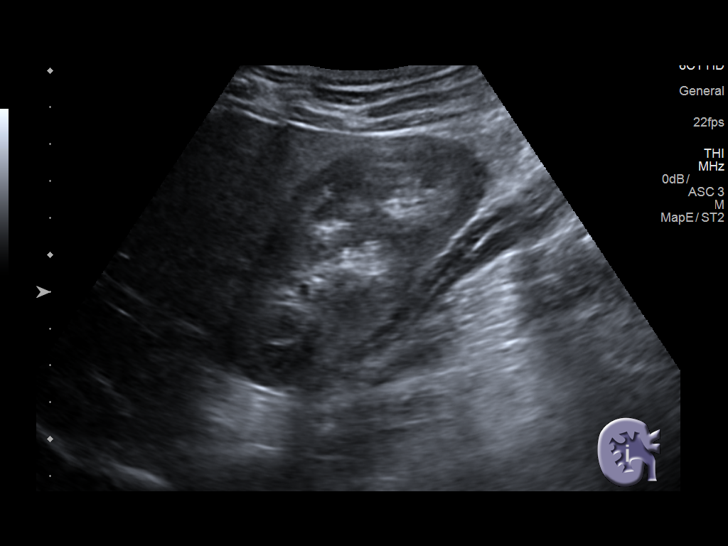
[im 5/23]
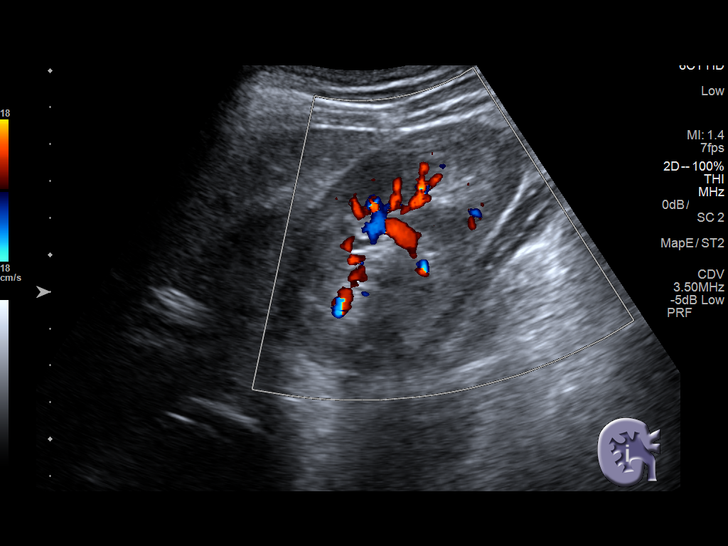
[im 6/23]
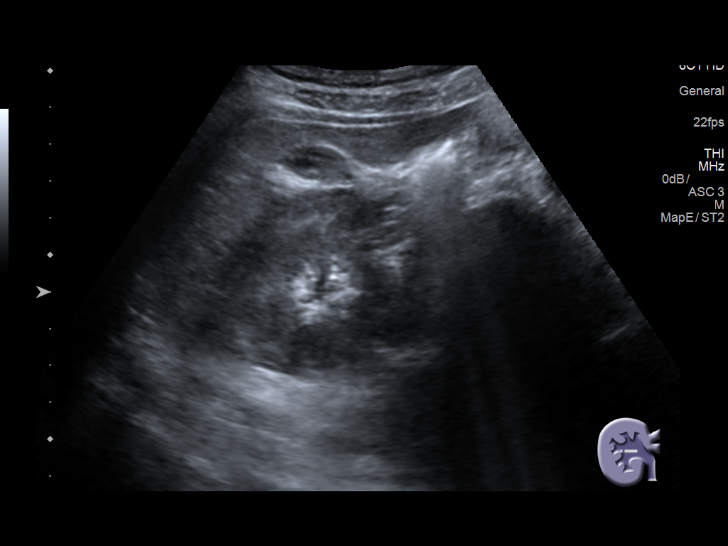
[im 8/23]
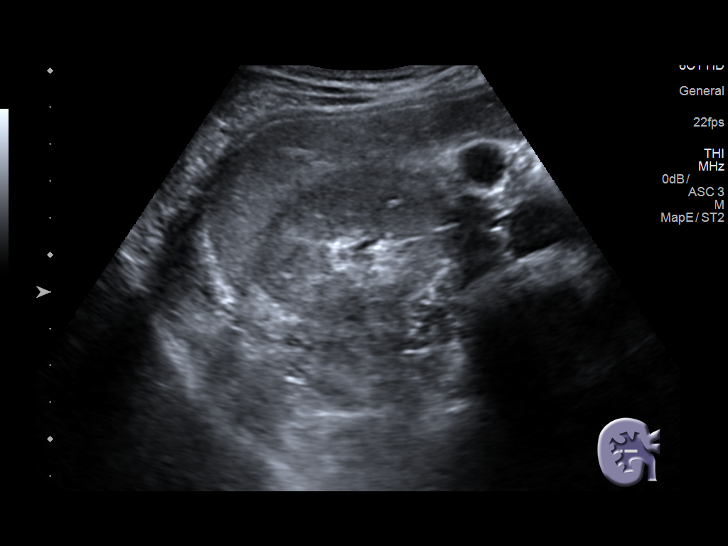
[im 10/23]
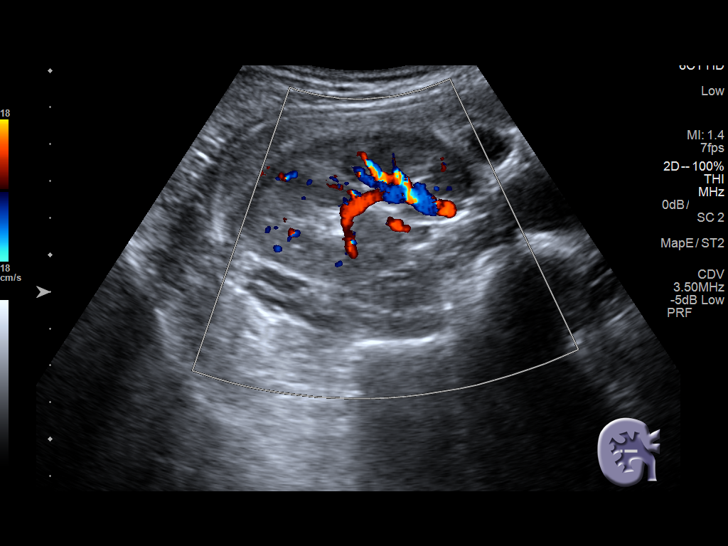
[im 11/23]
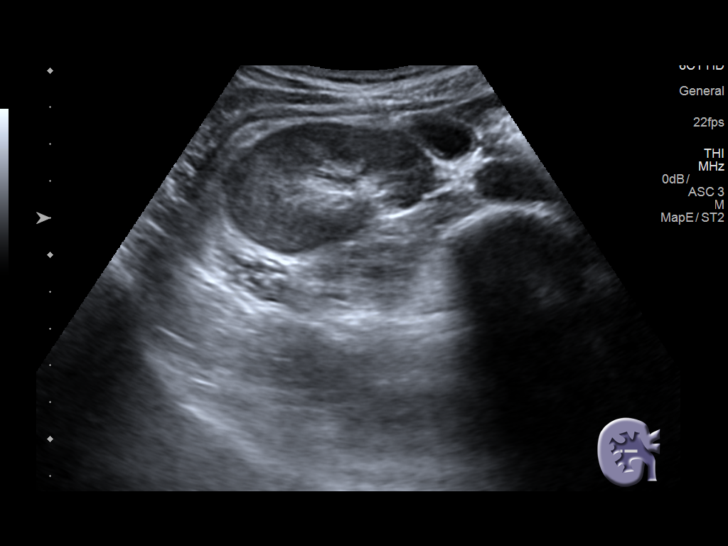
[im 13/23]
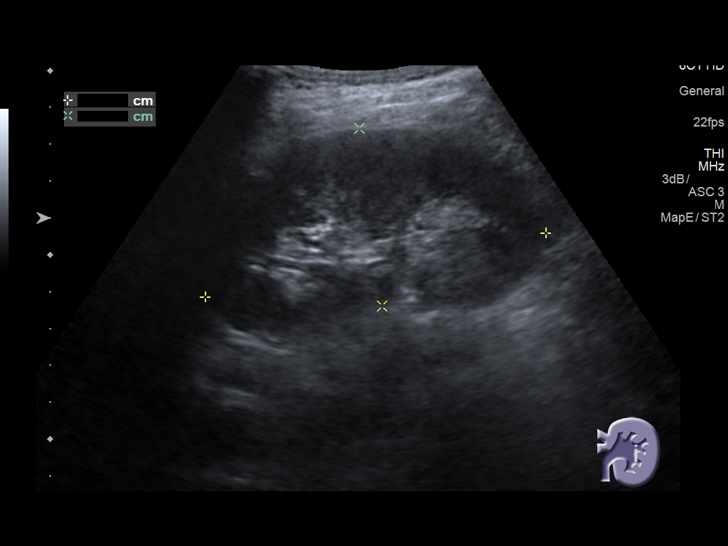
[im 14/23]
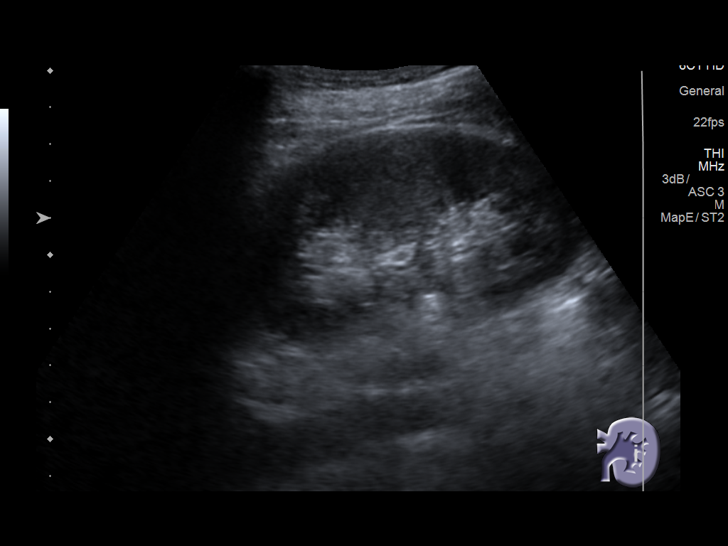
[im 16/23]
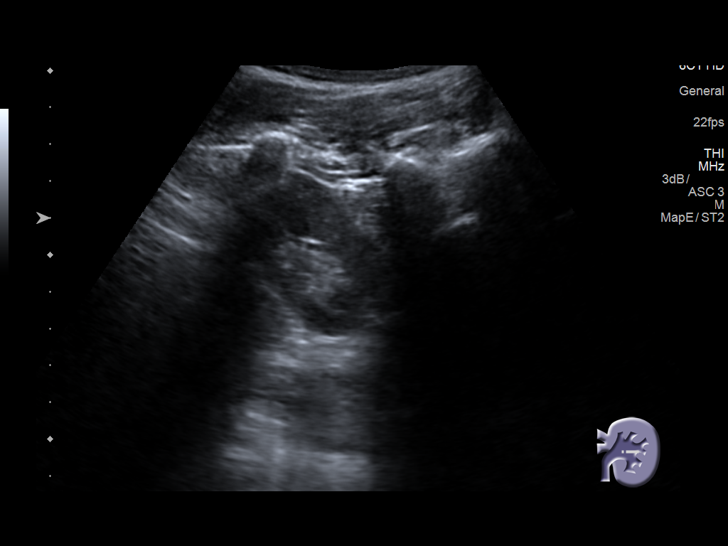
[im 18/23]
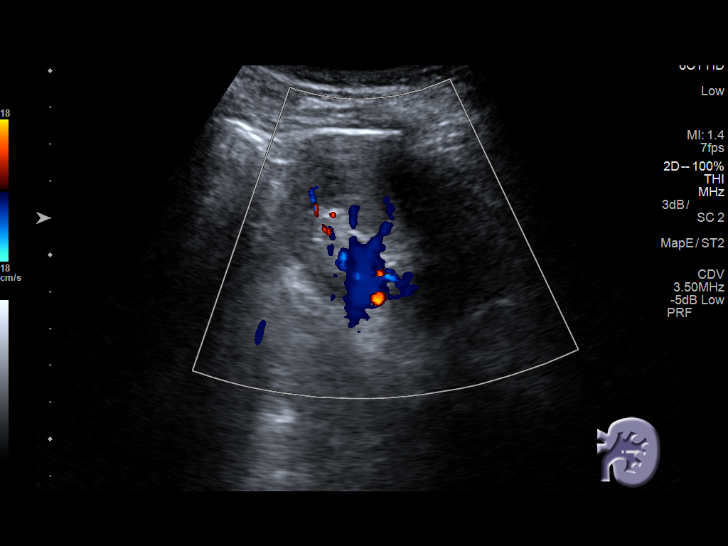
[im 19/23]
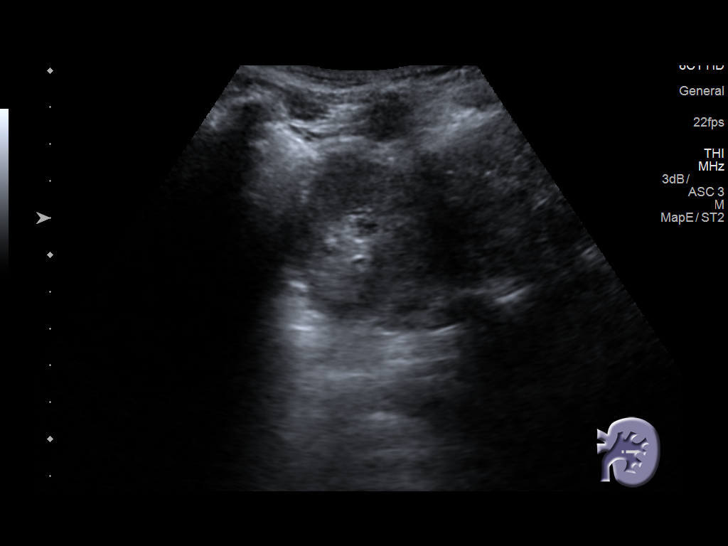
[im 21/23]
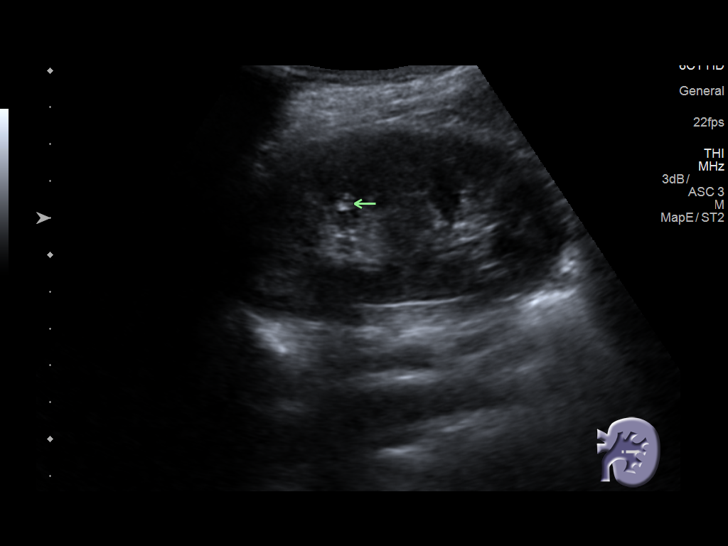
[im 23/23]
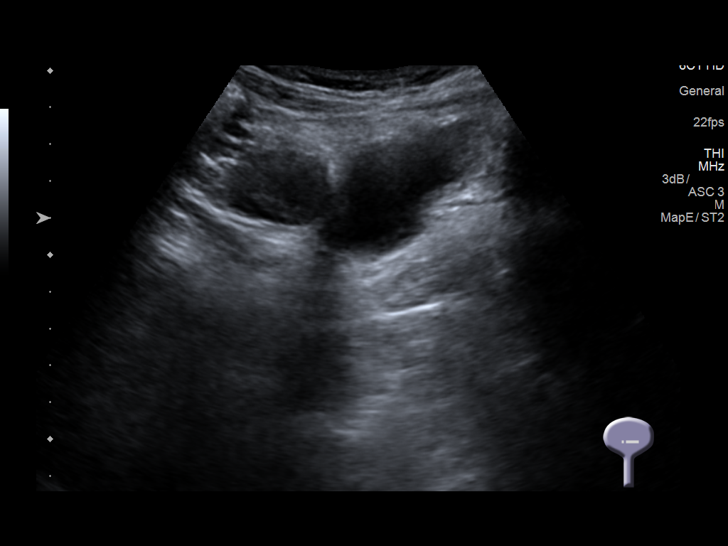

[14 of 23 positions shown; findings below may reference images not displayed]

FINDINGS: Right Kidney:

Length: 8.6 cm. Echogenicity within normal limits. No mass or
hydronephrosis visualized. Evidence of stone disease.

Left Kidney:

Length: 9.6 cm. Echogenicity within normal limits. No mass or
hydronephrosis visualized. 2 mm stone in the midportion as seen on
previous CT. Nonobstructing.

Bladder:

Appears normal for degree of bladder distention.
IMPRESSION: Normal appearance of the kidneys themselves. 2 mm stone in the
midportion of the left kidney, nonobstructing. No evidence of
right-sided stone disease or hydronephrosis.

## 2019-10-25 ENCOUNTER — Other Ambulatory Visit: Payer: Self-pay

## 2019-10-25 ENCOUNTER — Encounter (HOSPITAL_COMMUNITY): Payer: Self-pay | Admitting: Emergency Medicine

## 2019-10-25 ENCOUNTER — Emergency Department (HOSPITAL_COMMUNITY)
Admission: EM | Admit: 2019-10-25 | Discharge: 2019-10-26 | Disposition: A | Discharge status: Other Acute Inpatient Hospital | Payer: Medicaid Other | Attending: Emergency Medicine | Admitting: Emergency Medicine

## 2019-10-25 DIAGNOSIS — Z20822 Contact with and (suspected) exposure to covid-19: Secondary | ICD-10-CM | POA: Diagnosis not present

## 2019-10-25 DIAGNOSIS — F17210 Nicotine dependence, cigarettes, uncomplicated: Secondary | ICD-10-CM | POA: Diagnosis not present

## 2019-10-25 DIAGNOSIS — S50812A Abrasion of left forearm, initial encounter: Secondary | ICD-10-CM | POA: Diagnosis not present

## 2019-10-25 DIAGNOSIS — F329 Major depressive disorder, single episode, unspecified: Secondary | ICD-10-CM | POA: Insufficient documentation

## 2019-10-25 DIAGNOSIS — Y999 Unspecified external cause status: Secondary | ICD-10-CM | POA: Insufficient documentation

## 2019-10-25 DIAGNOSIS — F39 Unspecified mood [affective] disorder: Secondary | ICD-10-CM | POA: Diagnosis not present

## 2019-10-25 DIAGNOSIS — Y939 Activity, unspecified: Secondary | ICD-10-CM | POA: Insufficient documentation

## 2019-10-25 DIAGNOSIS — X789XXA Intentional self-harm by unspecified sharp object, initial encounter: Secondary | ICD-10-CM | POA: Insufficient documentation

## 2019-10-25 DIAGNOSIS — Y929 Unspecified place or not applicable: Secondary | ICD-10-CM | POA: Diagnosis not present

## 2019-10-25 DIAGNOSIS — Z7289 Other problems related to lifestyle: Secondary | ICD-10-CM

## 2019-10-25 DIAGNOSIS — R45851 Suicidal ideations: Secondary | ICD-10-CM

## 2019-10-25 DIAGNOSIS — S59912A Unspecified injury of left forearm, initial encounter: Secondary | ICD-10-CM | POA: Diagnosis present

## 2019-10-25 LAB — COMPREHENSIVE METABOLIC PANEL
ALT: 19 U/L (ref 0–44)
AST: 18 U/L (ref 15–41)
Albumin: 4.2 g/dL (ref 3.5–5.0)
Alkaline Phosphatase: 57 U/L (ref 38–126)
Anion gap: 9 (ref 5–15)
BUN: 10 mg/dL (ref 6–20)
CO2: 27 mmol/L (ref 22–32)
Calcium: 9.7 mg/dL (ref 8.9–10.3)
Chloride: 105 mmol/L (ref 98–111)
Creatinine, Ser: 0.71 mg/dL (ref 0.44–1.00)
GFR calc Af Amer: >60 mL/min (ref >60)
GFR calc non Af Amer: >60 mL/min (ref >60)
Glucose, Bld: 90 mg/dL (ref 70–99)
Potassium: 3.7 mmol/L (ref 3.5–5.1)
Sodium: 141 mmol/L (ref 135–145)
Total Bilirubin: 0.6 mg/dL (ref 0.3–1.2)
Total Protein: 7.1 g/dL (ref 6.5–8.1)

## 2019-10-25 LAB — CBC
HCT: 44.8 % (ref 36.0–46.0)
Hemoglobin: 14.1 g/dL (ref 12.0–15.0)
MCH: 30.9 pg (ref 26.0–34.0)
MCHC: 31.5 g/dL (ref 30.0–36.0)
MCV: 98 fL (ref 80.0–100.0)
Platelets: 222 10*3/uL (ref 150–400)
RBC: 4.57 MIL/uL (ref 3.87–5.11)
RDW: 12.8 % (ref 11.5–15.5)
WBC: 7.3 10*3/uL (ref 4.0–10.5)
nRBC: 0 % (ref 0.0–0.2)

## 2019-10-25 LAB — RAPID URINE DRUG SCREEN, HOSP PERFORMED
Amphetamines: POSITIVE — AB
Barbiturates: NOT DETECTED
Benzodiazepines: POSITIVE — AB
Cocaine: NOT DETECTED
Opiates: NOT DETECTED
Tetrahydrocannabinol: NOT DETECTED

## 2019-10-25 LAB — SARS CORONAVIRUS 2 BY RT PCR (HOSPITAL ORDER, PERFORMED IN ~~LOC~~ HOSPITAL LAB): SARS Coronavirus 2: NEGATIVE

## 2019-10-25 LAB — ETHANOL: Alcohol, Ethyl (B): <10 mg/dL (ref <10)

## 2019-10-25 LAB — SALICYLATE LEVEL: Salicylate Lvl: <7 mg/dL — ABNORMAL LOW (ref 7.0–30.0)

## 2019-10-25 LAB — I-STAT BETA HCG BLOOD, ED (MC, WL, AP ONLY): I-stat hCG, quantitative: <5 m[IU]/mL (ref <5)

## 2019-10-25 LAB — ACETAMINOPHEN LEVEL: Acetaminophen (Tylenol), Serum: <10 ug/mL — ABNORMAL LOW (ref 10–30)

## 2019-10-25 NOTE — ED Provider Notes (Signed)
[facility] EMERGENCY DEPARTMENT Provider Note   CSN: [id number] Arrival date & time: 10/25/19  2024     History Chief Complaint  Patient presents with  . Suicidal    Patty Mata is a 29 y.o. female.  29 yo F with a chief complaint of suicidal ideation.  Patient apparently had been slicing her arm earlier today and sent multiple pictures to a different friend saying to tell people that she love them and that she did not need to be around anymore.  Patient denies suicidal ideation or intent.  States that she is cutting her arm because she is depressed.  She currently denies suicidal or homicidal ideation.  States she was drinking earlier today but denies illegal drugs.  Used to be on antidepressant medication but it has been about a year since she had it last.  Had run out and not sought follow-up.  Denies prior psychiatric hospitalization.  She denies medical complaint denies cough congestion fever chills myalgias nausea vomiting or diarrhea.  The history is provided by the patient.  Illness Severity:  Moderate Onset quality:  Gradual Duration:  1 day Timing:  Constant Progression:  Unchanged Chronicity:  New Associated symptoms: no chest pain, no congestion, no fever, no headaches, no myalgias, no nausea, no rhinorrhea, no shortness of breath, no vomiting and no wheezing        Past Medical History:  Diagnosis Date  . Anxiety   . Kidney stones     Patient Active Problem List   Diagnosis Date Noted  . Cholestasis 03/03/2018  . Elevated liver function tests 02/19/2018  . No leakage of amniotic fluid into vagina 01/07/2018  . Rubella non-immune status, antepartum 11/04/2017  . Frequent UTI 10/30/2017  . Anxiety and depression 10/30/2017    Past Surgical History:  Procedure Laterality Date  . STENT PLACEMENT NON-VASCULAR ([facility] HX)     renal  . TUBAL LIGATION Bilateral 03/06/2018   Procedure: POST PARTUM TUBAL LIGATION;  Surgeon: Lesly Dukes,  MD;  Location: [facility] BIRTHING SUITES;  Service: Gynecology;  Laterality: Bilateral;     OB History    Gravida  2   Para  2   Term  2   Preterm      AB      Living  2     SAB      TAB      Ectopic      Multiple  0   Live Births  2           Family History  Problem Relation Age of Onset  . Depression Mother   . Arthritis Mother   . Asthma Father   . Mental illness Father   . Cancer Maternal Grandfather     Social History   Tobacco Use  . Smoking status: Current Every Day Smoker    Packs/day: 0.25    Years: 5.00    Pack years: 1.25    Types: Cigarettes  . Smokeless tobacco: Never Used  Vaping Use  . Vaping Use: Never used  Substance Use Topics  . Alcohol use: Not Currently    Comment: Socially; denies today  . 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 Authorizing Provider  alprazolam Prudy Feeler) 2 MG tablet Take 2 mg by mouth at bedtime as  needed for sleep.    [provider]  docusate sodium (COLACE) 100 MG capsule Take 1 capsule (100 mg total) by mouth every 12 (twelve) hours. 07/14/19   Renne Crigler, PA-C  glycerin adult 2 g suppository Place 1 suppository rectally as needed for constipation. 07/14/19   Renne Crigler, PA-C  polyethylene glycol powder (GLYCOLAX/MIRALAX) 17 GM/SCOOP powder Take 17 g by mouth daily. 07/14/19   Renne Crigler, PA-C  amLODipine (NORVASC) 5 MG tablet Take 1 tablet (5 mg total) by mouth daily. Patient not taking: Reported on 12/05/2018 03/07/18 07/14/19  Tilda Burrow, MD    Allergies    Penicillins  Review of Systems   Review of Systems  Constitutional: Negative for chills and fever.  HENT: Negative for congestion and rhinorrhea.   Eyes: Negative for redness and visual disturbance.  Respiratory: Negative for shortness of breath and  wheezing.   Cardiovascular: Negative for chest pain and palpitations.  Gastrointestinal: Negative for nausea and vomiting.  Genitourinary: Negative for dysuria and urgency.  Musculoskeletal: Negative for arthralgias and myalgias.  Skin: Positive for wound. Negative for pallor.  Neurological: Negative for dizziness and headaches.  Psychiatric/Behavioral: Positive for dysphoric mood and suicidal ideas.    Physical Exam Updated Vital Signs BP (!) 120/94 (BP Location: Right Arm)   Pulse (!) 117   Temp 98.2 F (36.8 C) (Oral)   Resp 16   LMP 10/25/2019   SpO2 100%   Physical Exam Vitals and nursing note reviewed.  Constitutional:      General: She is not in acute distress.    Appearance: She is well-developed. She is not diaphoretic.  HENT:     Head: Normocephalic and atraumatic.  Eyes:     Pupils: Pupils are equal, round, and reactive to light.  Cardiovascular:     Rate and Rhythm: Normal rate and regular rhythm.     Heart sounds: No murmur heard.  No friction rub. No gallop.   Pulmonary:     Effort: Pulmonary effort is normal.     Breath sounds: No wheezing or rales.  Abdominal:     General: There is no distension.     Palpations: Abdomen is soft.     Tenderness: There is no abdominal tenderness.  Musculoskeletal:        General: No tenderness.     Cervical back: Normal range of motion and neck supple.     Comments: Multiple superficial scratches to the left forearm.  Skin:    General: Skin is warm and dry.  Neurological:     Mental Status: She is alert and oriented to person, place, and time.  Psychiatric:        Behavior: Behavior normal.     ED Results / Procedures / Treatments   Labs (all labs ordered are listed, but only abnormal results are displayed) Labs Reviewed  SARS CORONAVIRUS 2 BY RT PCR (HOSPITAL ORDER, PERFORMED IN [facility])  COMPREHENSIVE METABOLIC PANEL  ETHANOL  SALICYLATE LEVEL  ACETAMINOPHEN LEVEL  CBC  RAPID URINE DRUG  SCREEN, HOSP PERFORMED  I-STAT BETA HCG BLOOD, ED ([facility], [facility], [facility] ONLY)    EKG None  Radiology No results found.  Procedures Procedures (including critical care time)  Medications Ordered in ED Medications - No data to display  ED Course  I have reviewed the triage vital signs and the nursing notes.  Pertinent labs & imaging results that were available during my care of the patient were reviewed by me and considered  in my medical decision making (see chart for details).    MDM Rules/Calculators/A&P                          29 yo F with a chief complaints of suicidal ideation.  Patient had apparently sent pictures to have multiple friends and family member saying that she was no longer needed here and to tell people that she love them.  IVC paperwork was filled out by her family member.  I feel she is medically clear.  TTS evaluation.  The patients results and plan were reviewed and discussed.   Any x-rays performed were independently reviewed by myself.   Differential diagnosis were considered with the presenting HPI.  Medications - No data to display  Vitals:   10/25/19 2030  BP: (!) 120/94  Pulse: (!) 117  Resp: 16  Temp: 98.2 F (36.8 C)  TempSrc: Oral  SpO2: 100%    Final diagnoses:  Suicidal ideation  Deliberate self-cutting      Final Clinical Impression(s) / ED Diagnoses Final diagnoses:  Suicidal ideation  Deliberate self-cutting    Rx / DC Orders ED Discharge Orders    None       Melene Plan, DO 10/25/19 2130

## 2019-10-25 NOTE — ED Triage Notes (Signed)
Pt BIB [facility] department, pt reports she "got in my feels" and cut her left arm. Bleeding controlled at this time. States she is not suicidal at this time. Pt reports taking xanax today.
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
## 2019-10-26 ENCOUNTER — Other Ambulatory Visit: Payer: Self-pay

## 2019-10-26 ENCOUNTER — Inpatient Hospital Stay (HOSPITAL_COMMUNITY)
Admission: AD | Admit: 2019-10-26 | Discharge: 2019-10-27 | DRG: 885 | Disposition: A | Discharge status: Other Acute Inpatient Hospital | Payer: Medicaid Other | Attending: Emergency Medicine | Admitting: Emergency Medicine

## 2019-10-26 ENCOUNTER — Encounter (HOSPITAL_COMMUNITY): Payer: Self-pay | Admitting: Nurse Practitioner

## 2019-10-26 DIAGNOSIS — Z825 Family history of asthma and other chronic lower respiratory diseases: Secondary | ICD-10-CM | POA: Diagnosis not present

## 2019-10-26 DIAGNOSIS — X789XXA Intentional self-harm by unspecified sharp object, initial encounter: Secondary | ICD-10-CM | POA: Diagnosis present

## 2019-10-26 DIAGNOSIS — S51812A Laceration without foreign body of left forearm, initial encounter: Secondary | ICD-10-CM | POA: Diagnosis present

## 2019-10-26 DIAGNOSIS — F17210 Nicotine dependence, cigarettes, uncomplicated: Secondary | ICD-10-CM | POA: Diagnosis present

## 2019-10-26 DIAGNOSIS — F1590 Other stimulant use, unspecified, uncomplicated: Secondary | ICD-10-CM | POA: Diagnosis present

## 2019-10-26 DIAGNOSIS — I959 Hypotension, unspecified: Secondary | ICD-10-CM | POA: Diagnosis present

## 2019-10-26 DIAGNOSIS — G4700 Insomnia, unspecified: Secondary | ICD-10-CM | POA: Diagnosis present

## 2019-10-26 DIAGNOSIS — F4000 Agoraphobia, unspecified: Secondary | ICD-10-CM | POA: Diagnosis present

## 2019-10-26 DIAGNOSIS — F1014 Alcohol abuse with alcohol-induced mood disorder: Secondary | ICD-10-CM | POA: Diagnosis present

## 2019-10-26 DIAGNOSIS — Z79899 Other long term (current) drug therapy: Secondary | ICD-10-CM

## 2019-10-26 DIAGNOSIS — F410 Panic disorder [episodic paroxysmal anxiety] without agoraphobia: Secondary | ICD-10-CM | POA: Diagnosis present

## 2019-10-26 DIAGNOSIS — Z87442 Personal history of urinary calculi: Secondary | ICD-10-CM

## 2019-10-26 DIAGNOSIS — Z880 Allergy status to penicillin: Secondary | ICD-10-CM | POA: Diagnosis not present

## 2019-10-26 DIAGNOSIS — F332 Major depressive disorder, recurrent severe without psychotic features: Secondary | ICD-10-CM | POA: Diagnosis present

## 2019-10-26 DIAGNOSIS — R45851 Suicidal ideations: Secondary | ICD-10-CM | POA: Diagnosis present

## 2019-10-26 DIAGNOSIS — Z818 Family history of other mental and behavioral disorders: Secondary | ICD-10-CM | POA: Diagnosis not present

## 2019-10-26 DIAGNOSIS — F1094 Alcohol use, unspecified with alcohol-induced mood disorder: Secondary | ICD-10-CM

## 2019-10-26 HISTORY — DX: Personal history of urinary calculi: Z87.442

## 2019-10-26 HISTORY — DX: Depression, unspecified: F32.A

## 2019-10-26 MED ORDER — ONDANSETRON 4 MG PO TBDP: 4.0000 mg | ORAL_TABLET | Freq: Four times a day (QID) | ORAL | Status: DC | PRN

## 2019-10-26 MED ORDER — TRAZODONE HCL 100 MG PO TABS: 50.0000 mg | ORAL_TABLET | Freq: Every evening | ORAL | Status: DC | PRN

## 2019-10-26 MED ORDER — HYDROXYZINE HCL 25 MG PO TABS: 25.0000 mg | ORAL_TABLET | Freq: Four times a day (QID) | ORAL | Status: DC | PRN

## 2019-10-26 MED ORDER — THIAMINE HCL 100 MG/ML IJ SOLN
100.0000 mg | Freq: Once | INTRAMUSCULAR | Status: AC
  Administered 2019-10-26: 100 mg via INTRAMUSCULAR
  Filled 2019-10-26: qty 2

## 2019-10-26 MED ORDER — LORAZEPAM 1 MG PO TABS
1.0000 mg | ORAL_TABLET | Freq: Four times a day (QID) | ORAL | Status: DC | PRN
  Filled 2019-10-26: qty 1

## 2019-10-26 MED ORDER — ACETAMINOPHEN 325 MG PO TABS
650.0000 mg | ORAL_TABLET | Freq: Four times a day (QID) | ORAL | Status: DC | PRN
  Administered 2019-10-26 – 2019-10-27 (×2): 650 mg via ORAL
  Filled 2019-10-26 (×2): qty 2

## 2019-10-26 MED ORDER — SERTRALINE HCL 50 MG PO TABS
25.0000 mg | ORAL_TABLET | Freq: Every day | ORAL | Status: DC
  Administered 2019-10-26: 25 mg via ORAL
  Filled 2019-10-26 (×5): qty 1

## 2019-10-26 MED ORDER — ALUM & MAG HYDROXIDE-SIMETH 200-200-20 MG/5ML PO SUSP: 30.0000 mL | ORAL | Status: DC | PRN

## 2019-10-26 MED ORDER — MAGNESIUM HYDROXIDE 400 MG/5ML PO SUSP: 30.0000 mL | Freq: Every day | ORAL | Status: DC | PRN

## 2019-10-26 MED ORDER — LOPERAMIDE HCL 2 MG PO CAPS: 2.0000 mg | ORAL_CAPSULE | ORAL | Status: DC | PRN

## 2019-10-26 MED ORDER — NICOTINE 21 MG/24HR TD PT24
21.0000 mg | MEDICATED_PATCH | Freq: Every day | TRANSDERMAL | Status: DC
  Administered 2019-10-26 – 2019-10-27 (×2): 21 mg via TRANSDERMAL
  Filled 2019-10-26 (×4): qty 1

## 2019-10-26 MED ORDER — LORAZEPAM 1 MG PO TABS: 1.0000 mg | ORAL_TABLET | Freq: Every day | ORAL | Status: DC

## 2019-10-26 MED ORDER — ADULT MULTIVITAMIN W/MINERALS CH
1.0000 | ORAL_TABLET | Freq: Every day | ORAL | Status: DC
  Administered 2019-10-26 – 2019-10-27 (×2): 1 via ORAL
  Filled 2019-10-26 (×5): qty 1

## 2019-10-26 MED ORDER — LORAZEPAM 1 MG PO TABS: 1.0000 mg | ORAL_TABLET | Freq: Two times a day (BID) | ORAL | Status: DC

## 2019-10-26 MED ORDER — LOPERAMIDE HCL 2 MG PO CAPS
2.0000 mg | ORAL_CAPSULE | ORAL | Status: DC | PRN
Start: 2019-10-26 — End: 2019-10-26

## 2019-10-26 MED ORDER — LORAZEPAM 1 MG PO TABS
1.0000 mg | ORAL_TABLET | Freq: Four times a day (QID) | ORAL | Status: AC
  Administered 2019-10-26 – 2019-10-27 (×4): 1 mg via ORAL
  Filled 2019-10-26 (×3): qty 1

## 2019-10-26 MED ORDER — ADULT MULTIVITAMIN W/MINERALS CH
1.0000 | ORAL_TABLET | Freq: Every day | ORAL | Status: DC
  Filled 2019-10-26 (×2): qty 1

## 2019-10-26 MED ORDER — THIAMINE HCL 100 MG PO TABS
100.0000 mg | ORAL_TABLET | Freq: Every day | ORAL | Status: DC
  Filled 2019-10-26: qty 1

## 2019-10-26 MED ORDER — THIAMINE HCL 100 MG PO TABS
100.0000 mg | ORAL_TABLET | Freq: Every day | ORAL | Status: DC
  Administered 2019-10-27: 100 mg via ORAL
  Filled 2019-10-26 (×3): qty 1

## 2019-10-26 MED ORDER — HYDROXYZINE HCL 25 MG PO TABS
25.0000 mg | ORAL_TABLET | Freq: Four times a day (QID) | ORAL | Status: DC | PRN
  Administered 2019-10-26: 25 mg via ORAL
  Filled 2019-10-26: qty 1

## 2019-10-26 MED ORDER — HYDROXYZINE HCL 25 MG PO TABS: 25.0000 mg | ORAL_TABLET | Freq: Three times a day (TID) | ORAL | Status: DC | PRN

## 2019-10-26 MED ORDER — LORAZEPAM 1 MG PO TABS: 1.0000 mg | ORAL_TABLET | Freq: Four times a day (QID) | ORAL | Status: DC | PRN

## 2019-10-26 MED ORDER — LORAZEPAM 1 MG PO TABS
1.0000 mg | ORAL_TABLET | Freq: Three times a day (TID) | ORAL | Status: DC
  Administered 2019-10-27: 1 mg via ORAL
  Filled 2019-10-26: qty 1

## 2019-10-26 NOTE — Progress Notes (Signed)
Patient is 29 yrs old, single, first admission to [facility], IVC.  Patient was brought to [facility] ER by sheriff.  Patient stated she drinks one pint 94 proof whiskey daily for the past 1.5 years.   Takes xanax 2 mg daily since 2012, obtained from a friend.   Had prescription for xanax in the past, but now buys xanax from a friend.  Smokes one pack cigarettes daily since 79 yrs old.  Denied THC, cocaine and heroin use.  Rated depression 4, anxiety 10, hopeless 2.   Denied SI during admission process.  Denied HI.  Denied A/V hallucinations.   Contracts for safety.   Skin Assessment:  Numerous tattoos on chest, neck, R upper arm, L abdomen, top of R foot, upper back.  Patient cut L lower arm with razor blade, numerous cuts.  No stitches or dressing.  No drainage, swelling, odor. Fall risk information given and discussed with patient, low fall risk.  Children are  55 yrs old and lives with his dad; another child 54 mos old and lives with his dad.  Patient plans to return home to boyfriend and children.

## 2019-10-26 NOTE — ED Notes (Signed)
Pt has started her menstrual cycle. Gave pt pads and paper scrubs.

## 2019-10-26 NOTE — Progress Notes (Signed)
Psychoeducational Group Note  Date:  10/26/2019 Time:  2129  Group Topic/Focus:  Wrap-Up Group:   The focus of this group is to help patients review their daily goal of treatment and discuss progress on daily workbooks.  Participation Level: Did Not Attend  Participation Quality:  Not Applicable  Affect:  Not Applicable  Cognitive:  Not Applicable  Insight:  Not Applicable  Engagement in Group: Not Applicable  Additional Comments:  The patient did not attend group this evening since she was asleep.   Hazle Coca S 10/26/2019, 9:29 PM

## 2019-10-26 NOTE — ED Notes (Signed)
Pt given [place] sandwich and drink.  Sitter at bedside

## 2019-10-26 NOTE — Plan of Care (Signed)
Nurse discussed anxiety, depression and coping skills with patient.  

## 2019-10-26 NOTE — Tx Team (Signed)
Initial Treatment Plan 10/26/2019 3:19 PM Patty Mata Patty Mata KNL:[id number]    PATIENT STRESSORS: Financial difficulties Marital or family conflict Medication change or noncompliance Occupational concerns Substance abuse   PATIENT STRENGTHS: Ability for insight Average or above average intelligence Motivation for treatment/growth Supportive family/friends   PATIENT IDENTIFIED PROBLEMS: "alcohol abuse"  "suicidal thoughts"  "depression"                 DISCHARGE CRITERIA:  Ability to meet basic life and health needs Adequate post-discharge living arrangements Improved stabilization in mood, thinking, and/or behavior Medical problems require only outpatient monitoring Motivation to continue treatment in a less acute level of care Need for constant or close observation no longer present Reduction of life-threatening or endangering symptoms to within safe limits Safe-care adequate arrangements made Verbal commitment to aftercare and medication compliance  PRELIMINARY DISCHARGE PLAN: Attend 12-step recovery group Outpatient therapy Return to previous living arrangement  PATIENT/FAMILY INVOLVEMENT: This treatment plan has been presented to and reviewed with the patient, Patty Mata.    The patient and family have been given the opportunity to ask questions and make suggestions.  Quintella Reichert [place], [place] 10/26/2019, 3:19 PM

## 2019-10-26 NOTE — ED Notes (Signed)
Pt's mother will call tomorrow morning for update on plan of care.

## 2019-10-26 NOTE — ED Notes (Signed)
Lunch Tray Ordered @ 1027. °
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
## 2019-10-26 NOTE — Progress Notes (Signed)
Pt has been accepted to [facility], Room 302-02 to the service of MD Clary, after 1000 hours.  Report may be called to [phone] after transportation has been arranged.

## 2019-10-26 NOTE — ED Notes (Signed)
Mom called and desiring to know urine tox report. RN explained due to HIPPA laws, cant discuss.

## 2019-10-26 NOTE — H&P (Addendum)
Psychiatric Admission Assessment Adult  Patient Identification: Patty Mata MRN:  [id number] Date of Evaluation:  10/26/2019 Chief Complaint:  Severe recurrent major depression without psychotic features ([place]) [F33.2] Principal Diagnosis: <principal problem not specified> Diagnosis:  Active Problems:   Severe recurrent major depression without psychotic features ([place])   Alcohol-induced mood disorder ([facility])  History of Present Illness: From MD's admission SRA: 29, single, lives with BF and children ( 9 years and 38 months old). Unemployed. She presented to ED on 7/26 via [employer] following episode of self injurious behaviors/ cutting her forearm. Has several superficial lacerations on L forearm. She reports she has been feeling depressed " for a while " ( several months)  and describes neuro-vegetative symptoms- reports insomnia, anhedonia, decreased appetite, decreased sense of self esteem.  Denies psychotic symptoms. She denies having had suicidal ideations leading up to event/admission.  She reports she has been drinking regularly, often daily , up to a bottle of whisky per day. She reports intermittent methamphetamine use. Admission  UDS (+) for BZDs an Amphetamines. Admission BAL negative. States last drank two days ago.  Denies medical illnesses. Allergic to PCN. Denies history of seizures. Home medications- Xanax which she states she was taking at 1 mgr BID for several years . She denies abusing or misusing this medication. PDMP review shows no record of a Xanax prescription.   Associated Signs/Symptoms: Depression Symptoms:  depressed mood, anhedonia, insomnia, fatigue, feelings of worthlessness/guilt, suicidal thoughts with specific plan, decreased appetite, (Hypo) Manic Symptoms:  denies Anxiety Symptoms:  Excessive Worry, Psychotic Symptoms:  denies PTSD Symptoms: NA Total Time spent with patient: 30 minutes  Past Psychiatric History: Denies past history of suicidal attempts,  denies past history of self cutting or self injurious behaviors, denies past history of psychiatric admissions . She does endorse past history of depressive episodes  Denies history of mania or hypomania. In addition to mood disorder/depression also endorses history of anxiety/panic attack and some agoraphobia. She states she had been on Zoloft about a year ago, and states it was well tolerated and helpful.   Is the patient at risk to self? Yes.    Has the patient been a risk to self in the past 6 months? No.  Has the patient been a risk to self within the distant past? No.  Is the patient a risk to others? No.  Has the patient been a risk to others in the past 6 months? No.  Has the patient been a risk to others within the distant past? No.   Prior Inpatient Therapy:   Prior Outpatient Therapy:    Alcohol Screening: 1. How often do you have a drink containing alcohol?: 4 or more times a week 2. How many drinks containing alcohol do you have on a typical day when you are drinking?: 10 or more 3. How often do you have six or more drinks on one occasion?: Daily or almost daily AUDIT-C Score: 12 4. How often during the last year have you found that you were not able to stop drinking once you had started?: Daily or almost daily 5. How often during the last year have you failed to do what was normally expected from you because of drinking?: Daily or almost daily 6. How often during the last year have you needed a first drink in the morning to get yourself going after a heavy drinking session?: Daily or almost daily 7. How often during the last year have you had a feeling of guilt of  remorse after drinking?: Daily or almost daily 8. How often during the last year have you been unable to remember what happened the night before because you had been drinking?: Daily or almost daily 9. Have you or someone else been injured as a result of your drinking?: No 10. Has a relative or friend or a doctor or  another health worker been concerned about your drinking or suggested you cut down?: Yes, during the last year Alcohol Use Disorder Identification Test Final Score (AUDIT): 36 Alcohol Brief Interventions/Follow-up: Alcohol Education Substance Abuse History in the last 12 months:  Yes.   Consequences of Substance Abuse: Medical Consequences:  overdose Withdrawal Symptoms:   Diaphoresis Previous Psychotropic Medications: Yes  Psychological Evaluations: No  Past Medical History:  Past Medical History:  Diagnosis Date  . Anxiety   . Depression   . History of kidney stones   . Kidney stones     Past Surgical History:  Procedure Laterality Date  . STENT PLACEMENT NON-VASCULAR ([place] HX)     renal  . TUBAL LIGATION Bilateral 03/06/2018   Procedure: POST PARTUM TUBAL LIGATION;  Surgeon: Guss Bunde, MD;  Location: [facility];  Service: Gynecology;  Laterality: Bilateral;   Family History:  Family History  Problem Relation Age of Onset  . Depression Mother   . Arthritis Mother   . Asthma Father   . Mental illness Father   . Cancer Maternal Grandfather    Family Psychiatric  History: Father with unspecified mental health issues. Tobacco Screening: Have you used any form of tobacco in the last 30 days? (Cigarettes, Smokeless Tobacco, Cigars, and/or Pipes): Yes Tobacco use, Select all that apply: 5 or more cigarettes per day Are you interested in Tobacco Cessation Medications?: Yes, will notify MD for an order Counseled patient on smoking cessation including recognizing danger situations, developing coping skills and basic information about quitting provided: Yes Social History:  Social History   Substance and Sexual Activity  Alcohol Use Yes   Comment: one pint whiskey daily     Social History   Substance and Sexual Activity  Drug Use Not Currently    Additional Social History:      Pain Medications: see MAR Prescriptions: see MAR Over the Counter: see  MAR History of alcohol / drug use?: Yes Longest period of sobriety (when/how long): unknown Negative Consequences of Use: Financial, Personal relationships Name of Substance 1: alcohol 1 - Age of First Use: 29 yrs old 1 - Amount (size/oz): one pint whiskey daily 1 - Frequency: daily 1 - Duration: several yrs 1 - Last Use / Amount: Sunday Name of Substance 2: xanax 2 - Age of First Use: 29 yrs old 2 - Amount (size/oz): xanax 2 mg daily 2 - Frequency: daily 2 - Duration: since age of 29 yrs old 2 - Last Use / Amount: Sunday                Allergies:   Allergies  Allergen Reactions  . Penicillins Rash    Has patient had a PCN reaction causing immediate rash, facial/tongue/throat swelling, SOB or lightheadedness with hypotension: Yes Has patient had a PCN reaction causing severe rash involving mucus membranes or skin necrosis: No Has patient had a PCN reaction that required hospitalization: No Has patient had a PCN reaction occurring within the last 10 years: No If all of the above answers are "NO", then may proceed with Cephalosporin use.    Lab Results:  Results for orders placed or  performed during the hospital encounter of 10/25/19 (from the past 48 hour(s))  Comprehensive metabolic panel     Status: None   Collection Time: 10/25/19  9:30 PM  Result Value Ref Range   Sodium 141 135 - 145 mmol/L   Potassium 3.7 3.5 - 5.1 mmol/L   Chloride 105 98 - 111 mmol/L   CO2 27 22 - 32 mmol/L   Glucose, Bld 90 70 - 99 mg/dL    Comment: Glucose reference range applies only to samples taken after fasting for at least 8 hours.   BUN 10 6 - 20 mg/dL   Creatinine, Ser 0.71 0.44 - 1.00 mg/dL   Calcium 9.7 8.9 - 10.3 mg/dL   Total Protein 7.1 6.5 - 8.1 g/dL   Albumin 4.2 3.5 - 5.0 g/dL   AST 18 15 - 41 U/L   ALT 19 0 - 44 U/L   Alkaline Phosphatase 57 38 - 126 U/L   Total Bilirubin 0.6 0.3 - 1.2 mg/dL   GFR calc non Af Amer >60 >60 mL/min   GFR calc Af Amer >60 >60 mL/min    Anion gap 9 5 - 15    Comment: Performed at [facility], [place] [address], [place], [place] [address]  Ethanol     Status: None   Collection Time: 10/25/19  9:30 PM  Result Value Ref Range   Alcohol, Ethyl (B) <10 <10 mg/dL    Comment: (NOTE) Lowest detectable limit for serum alcohol is 10 mg/dL.  For medical purposes only. Performed at [facility], [place] [address] [place], [place] [address]   Salicylate level     Status: Abnormal   Collection Time: 10/25/19  9:30 PM  Result Value Ref Range   Salicylate Lvl <4.2 (L) 7.0 - 30.0 mg/dL    Comment: Performed at [facility] [address] [place], [place] [address]  Acetaminophen level     Status: Abnormal   Collection Time: 10/25/19  9:30 PM  Result Value Ref Range   Acetaminophen (Tylenol), Serum <10 (L) 10 - 30 ug/mL    Comment: (NOTE) Therapeutic concentrations vary significantly. A range of 10-30 ug/mL  may be an effective concentration for many patients. However, some  are best treated at concentrations outside of this range. Acetaminophen concentrations >150 ug/mL at 4 hours after ingestion  and >50 ug/mL at 12 hours after ingestion are often associated with  toxic reactions.  Performed at [facility], [place] [address] [place], [place] [address]   cbc     Status: None   Collection Time: 10/25/19  9:30 PM  Result Value Ref Range   WBC 7.3 4.0 - 10.5 K/uL   RBC 4.57 3.87 - 5.11 MIL/uL   Hemoglobin 14.1 12.0 - 15.0 g/dL   HCT 44.8 36 - 46 %   MCV 98.0 80.0 - 100.0 fL   MCH 30.9 26.0 - 34.0 pg   MCHC 31.5 30.0 - 36.0 g/dL   RDW 12.8 11.5 - 15.5 %   Platelets 222 150 - 400 K/uL   nRBC 0.0 0.0 - 0.2 %    Comment: Performed at [facility], [place] [address], [place], [place] [address]  Rapid urine drug screen (hospital performed)     Status: Abnormal   Collection Time: 10/25/19  9:44 PM  Result Value Ref Range   Opiates NONE DETECTED NONE DETECTED   Cocaine NONE DETECTED NONE DETECTED    Benzodiazepines POSITIVE (A) NONE  DETECTED   Amphetamines POSITIVE (A) NONE DETECTED   Tetrahydrocannabinol NONE DETECTED NONE DETECTED   Barbiturates NONE DETECTED NONE DETECTED    Comment: (NOTE) DRUG SCREEN FOR MEDICAL PURPOSES ONLY.  IF CONFIRMATION IS NEEDED FOR ANY PURPOSE, NOTIFY LAB WITHIN 5 DAYS.  LOWEST DETECTABLE LIMITS FOR URINE DRUG SCREEN Drug Class                     Cutoff (ng/mL) Amphetamine and metabolites    1000 Barbiturate and metabolites    200 Benzodiazepine                 188 Tricyclics and metabolites     300 Opiates and metabolites        300 Cocaine and metabolites        300 THC                            50 Performed at [facility], [place] [address], [place], [place] [address]   I-Stat beta hCG blood, ED     Status: None   Collection Time: 10/25/19 10:09 PM  Result Value Ref Range   I-stat hCG, quantitative <5.0 <5 mIU/mL   Comment 3            Comment:   GEST. AGE      CONC.  (mIU/mL)   <=1 WEEK        5 - 50     2 WEEKS       50 - 500     3 WEEKS       100 - 10,000     4 WEEKS     1,000 - 30,000        FEMALE AND NON-PREGNANT FEMALE:     LESS THAN 5 mIU/mL   SARS Coronavirus 2 by RT PCR (hospital order, performed in [facility] [place] hospital lab) Nasopharyngeal Nasopharyngeal Swab     Status: None   Collection Time: 10/25/19 10:11 PM   Specimen: Nasopharyngeal Swab  Result Value Ref Range   SARS Coronavirus 2 NEGATIVE NEGATIVE    Comment: (NOTE) SARS-CoV-2 target nucleic acids are NOT DETECTED.  The SARS-CoV-2 RNA is generally detectable in upper and lower respiratory specimens during the acute phase of infection. The lowest concentration of SARS-CoV-2 viral copies this assay can detect is 250 copies / mL. A negative result does not preclude SARS-CoV-2 infection and should not be used as the sole basis for treatment or other patient management decisions.  A negative result may occur with improper specimen collection / handling,  submission of specimen other than nasopharyngeal swab, presence of viral mutation(s) within the areas targeted by this assay, and inadequate number of viral copies (<250 copies / mL). A negative result must be combined with clinical observations, patient history, and epidemiological information.  Fact Sheet for Patients:   StrictlyIdeas.no  Fact Sheet for Healthcare Providers: BankingDealers.co.za  This test is not yet approved or  cleared by the [place] FDA and has been authorized for detection and/or diagnosis of SARS-CoV-2 by FDA under an Emergency Use Authorization (EUA).  This EUA will remain in effect (meaning this test can be used) for the duration of the COVID-19 declaration under Section 564(b)(1) of the Act, 21 U.S.C. section 360bbb-3(b)(1), unless the authorization is terminated or revoked sooner.  Performed at [facility], [place] [address] [place], [place] [address]     Blood Alcohol  level:  Lab Results  Component Value Date   ETH <10 10/25/2019   ETH 21 (H) 60/73/7106    Metabolic Disorder Labs:  Lab Results  Component Value Date   HGBA1C 5.2 10/30/2017   No results found for: PROLACTIN No results found for: CHOL, TRIG, HDL, CHOLHDL, VLDL, LDLCALC  Current Medications: Current Facility-Administered Medications  Medication Dose Route Frequency Provider Last Rate Last Admin  . acetaminophen (TYLENOL) tablet 650 mg  650 mg Oral Q6H PRN Lindon Romp A, NP      . hydrOXYzine (ATARAX/VISTARIL) tablet 25 mg  25 mg Oral Q6H PRN Chrystel Barefield, Myer Peer, MD      . LORazepam (ATIVAN) tablet 1 mg  1 mg Oral Q6H PRN Brynnleigh Mcelwee, Myer Peer, MD      . LORazepam (ATIVAN) tablet 1 mg  1 mg Oral QID Kerrie Latour, Myer Peer, MD       Followed by  . [START ON 10/27/2019] LORazepam (ATIVAN) tablet 1 mg  1 mg Oral TID Etheline Geppert, Myer Peer, MD       Followed by  . [START ON 10/28/2019] LORazepam (ATIVAN) tablet 1 mg  1 mg Oral BID Shayana Hornstein,  Myer Peer, MD       Followed by  . [START ON 10/30/2019] LORazepam (ATIVAN) tablet 1 mg  1 mg Oral Daily Jakeline Dave, Myer Peer, MD      . multivitamin with minerals tablet 1 tablet  1 tablet Oral Daily Cathlene Gardella, Myer Peer, MD      . sertraline (ZOLOFT) tablet 25 mg  25 mg Oral Daily Alina Gilkey A, MD      . thiamine (B-1) injection 100 mg  100 mg Intramuscular Once Lailah Marcelli, Myer Peer, MD      . Derrill Memo ON 10/27/2019] thiamine tablet 100 mg  100 mg Oral Daily Verdelle Valtierra, Myer Peer, MD      . traZODone (DESYREL) tablet 50 mg  50 mg Oral QHS PRN Rozetta Nunnery, NP       PTA Medications: Medications Prior to Admission  Medication Sig Dispense Refill Last Dose  . ALPRAZolam (XANAX) 1 MG tablet Take 1 mg by mouth 3 (three) times daily as needed for anxiety.     . docusate sodium (COLACE) 100 MG capsule Take 1 capsule (100 mg total) by mouth every 12 (twelve) hours. (Patient not taking: Reported on 10/25/2019) 60 capsule 0   . glycerin adult 2 g suppository Place 1 suppository rectally as needed for constipation. (Patient not taking: Reported on 10/25/2019) 12 suppository 0   . polyethylene glycol powder (GLYCOLAX/MIRALAX) 17 GM/SCOOP powder Take 17 g by mouth daily. (Patient not taking: Reported on 10/25/2019) 255 g 0     Musculoskeletal: Strength & Muscle Tone: within normal limits Gait & Station: normal Patient leans: N/A  Psychiatric Specialty Exam: Physical Exam Vitals and nursing note reviewed.  Constitutional:      Appearance: She is well-developed.  Cardiovascular:     Rate and Rhythm: Normal rate.  Pulmonary:     Effort: Pulmonary effort is normal.  Neurological:     Mental Status: She is alert and oriented to person, place, and time.     Review of Systems  Constitutional: Negative.   Respiratory: Negative for cough and shortness of breath.   Psychiatric/Behavioral: Positive for dysphoric mood and self-injury. Negative for agitation, behavioral problems, confusion, hallucinations, sleep  disturbance and suicidal ideas. The patient is nervous/anxious. The patient is not hyperactive.     Blood pressure (!) 117/96, pulse 98, temperature 97.9  F (36.6 C), temperature source Oral, resp. rate 18, last menstrual period 10/25/2019, SpO2 100 %, unknown if currently breastfeeding.There is no height or weight on file to calculate BMI.  General Appearance: Fairly Groomed  Eye Contact:  Fair  Speech:  Normal Rate  Volume:  Decreased  Mood:  Depressed  Affect:  Congruent and Tearful  Thought Process:  Coherent  Orientation:  Full (Time, Place, and Person)  Thought Content:  Logical  Suicidal Thoughts:  No  Homicidal Thoughts:  No  Memory:  Immediate;   Fair Recent;   Fair Remote;   Fair  Judgement:  Intact  Insight:  Fair  Psychomotor Activity:  Normal  Concentration:  Concentration: Fair and Attention Span: Fair  Recall:  [employer] of Knowledge:  Fair  Language:  Fair  Akathisia:  No  Handed:  Right  AIMS (if indicated):     Assets:  Communication Skills Desire for Improvement Housing Resilience  ADL's:  Intact  Cognition:  WNL  Sleep:       Treatment Plan Summary: Daily contact with patient to assess and evaluate symptoms and progress in treatment and Medication management   Inpatient hospitalization.  See MD's admission SRA for medication management.  Patient will participate in the therapeutic group milieu.  Discharge disposition in progress.   Observation Level/Precautions:  15 minute checks  Laboratory:  BMP a1c lipid panel TSH  Psychotherapy:  Group therapy  Medications:  See MAR  Consultations:  PRN  Discharge Concerns:  Safety and stabilization  Estimated LOS: 3-5 days  Other:     Physician Treatment Plan for Primary Diagnosis: <principal problem not specified> Long Term Goal(s): Improvement in symptoms so as ready for discharge  Short Term Goals: Ability to identify changes in lifestyle to reduce recurrence of condition will improve, Ability  to verbalize feelings will improve and Ability to disclose and discuss suicidal ideas  Physician Treatment Plan for Secondary Diagnosis: Active Problems:   Severe recurrent major depression without psychotic features ([place])   Alcohol-induced mood disorder ([place])  Long Term Goal(s): Improvement in symptoms so as ready for discharge  Short Term Goals: Ability to demonstrate self-control will improve and Ability to identify and develop effective coping behaviors will improve  I certify that inpatient services furnished can reasonably be expected to improve the patient's condition.    Connye Burkitt, NP 7/27/20213:23 PM   I have discussed case with NP and have met with patient  Agree with NP note and assessment  29, single, lives with BF and children ( 9 years and 31 months old). Unemployed. She presented to ED on 7/26 via [employer] following episode of self injurious behaviors/ cutting her forearm. Has several superficial lacerations on L forearm. She reports she has been feeling depressed " for a while " ( several months)  and describes neuro-vegetative symptoms- reports insomnia, anhedonia, decreased appetite, decreased sense of self esteem.  Denies psychotic symptoms. She denies having had suicidal ideations leading up to event/admission.  She reports she has been drinking regularly, often daily , up to a bottle of whisky per day. She reports intermittent methamphetamine use. Admission  UDS (+) for BZDs an Amphetamines. Admission BAL negative. States last drank two days ago.   Denies past history of suicidal attempts, denies past history of self cutting or self injurious behaviors, denies past history of psychiatric admissions . She does endorse past history of depressive episodes  Denies history of mania or hypomania. In addition to mood disorder/depression also endorses  history of anxiety/panic attack and some agoraphobia. She states she had been on Zoloft about a year ago, and states it was well  tolerated and helpful.   Denies medical illnesses. Allergic to PCN. Denies history of seizures   Home medications- Xanax which she states she was taking at 1 mgr BID for several years . She denies abusing or misusing this medication  Dx- MDD, consider substance induced mood disorder. Alcohol Use Disorder, Stimulant Use Disorder  Plan- inpatient admission. Based on history of good response and tolerance to Zoloft will restart- she prefers to start at lower dose ( 25 mgrs QDAY initially) Ativan taper as per protocol for alcohol/BZD WDL Thiamine/Folate supplementation

## 2019-10-27 ENCOUNTER — Encounter (HOSPITAL_COMMUNITY): Payer: Self-pay | Admitting: Nurse Practitioner

## 2019-10-27 ENCOUNTER — Inpatient Hospital Stay (HOSPITAL_COMMUNITY)
Admission: AD | Admit: 2019-10-27 | Discharge: 2019-10-31 | DRG: 897 | Disposition: A | Discharge status: Home / Self Care | Payer: Medicaid Other | Source: Other Acute Inpatient Hospital | Attending: Psychiatry | Admitting: Psychiatry

## 2019-10-27 ENCOUNTER — Other Ambulatory Visit: Payer: Self-pay

## 2019-10-27 DIAGNOSIS — F1320 Sedative, hypnotic or anxiolytic dependence, uncomplicated: Secondary | ICD-10-CM | POA: Diagnosis not present

## 2019-10-27 DIAGNOSIS — F17210 Nicotine dependence, cigarettes, uncomplicated: Secondary | ICD-10-CM | POA: Diagnosis present

## 2019-10-27 DIAGNOSIS — Z915 Personal history of self-harm: Secondary | ICD-10-CM

## 2019-10-27 DIAGNOSIS — F1994 Other psychoactive substance use, unspecified with psychoactive substance-induced mood disorder: Principal | ICD-10-CM | POA: Diagnosis present

## 2019-10-27 DIAGNOSIS — F1020 Alcohol dependence, uncomplicated: Secondary | ICD-10-CM | POA: Diagnosis not present

## 2019-10-27 DIAGNOSIS — Z87442 Personal history of urinary calculi: Secondary | ICD-10-CM

## 2019-10-27 DIAGNOSIS — Z818 Family history of other mental and behavioral disorders: Secondary | ICD-10-CM

## 2019-10-27 DIAGNOSIS — Z825 Family history of asthma and other chronic lower respiratory diseases: Secondary | ICD-10-CM

## 2019-10-27 DIAGNOSIS — F1920 Other psychoactive substance dependence, uncomplicated: Secondary | ICD-10-CM | POA: Diagnosis not present

## 2019-10-27 DIAGNOSIS — G8929 Other chronic pain: Secondary | ICD-10-CM | POA: Diagnosis present

## 2019-10-27 DIAGNOSIS — Z79899 Other long term (current) drug therapy: Secondary | ICD-10-CM

## 2019-10-27 DIAGNOSIS — R630 Anorexia: Secondary | ICD-10-CM | POA: Diagnosis present

## 2019-10-27 DIAGNOSIS — Z809 Family history of malignant neoplasm, unspecified: Secondary | ICD-10-CM

## 2019-10-27 DIAGNOSIS — F411 Generalized anxiety disorder: Secondary | ICD-10-CM | POA: Diagnosis present

## 2019-10-27 DIAGNOSIS — Z8261 Family history of arthritis: Secondary | ICD-10-CM | POA: Diagnosis not present

## 2019-10-27 DIAGNOSIS — F329 Major depressive disorder, single episode, unspecified: Secondary | ICD-10-CM | POA: Diagnosis present

## 2019-10-27 DIAGNOSIS — F419 Anxiety disorder, unspecified: Secondary | ICD-10-CM

## 2019-10-27 DIAGNOSIS — F1310 Sedative, hypnotic or anxiolytic abuse, uncomplicated: Secondary | ICD-10-CM | POA: Diagnosis present

## 2019-10-27 DIAGNOSIS — F1094 Alcohol use, unspecified with alcohol-induced mood disorder: Secondary | ICD-10-CM

## 2019-10-27 DIAGNOSIS — Z880 Allergy status to penicillin: Secondary | ICD-10-CM

## 2019-10-27 DIAGNOSIS — R7989 Other specified abnormal findings of blood chemistry: Secondary | ICD-10-CM

## 2019-10-27 DIAGNOSIS — F32A Depression, unspecified: Secondary | ICD-10-CM

## 2019-10-27 DIAGNOSIS — F1520 Other stimulant dependence, uncomplicated: Secondary | ICD-10-CM | POA: Diagnosis not present

## 2019-10-27 LAB — URINALYSIS, ROUTINE W REFLEX MICROSCOPIC
Bilirubin Urine: NEGATIVE
Glucose, UA: NEGATIVE mg/dL
Ketones, ur: NEGATIVE mg/dL
Nitrite: NEGATIVE
Protein, ur: NEGATIVE mg/dL
Specific Gravity, Urine: 1.012 (ref 1.005–1.030)
pH: 6 (ref 5.0–8.0)

## 2019-10-27 LAB — BASIC METABOLIC PANEL
Anion gap: 7 (ref 5–15)
BUN: 14 mg/dL (ref 6–20)
CO2: 24 mmol/L (ref 22–32)
Calcium: 8.2 mg/dL — ABNORMAL LOW (ref 8.9–10.3)
Chloride: 106 mmol/L (ref 98–111)
Creatinine, Ser: 0.51 mg/dL (ref 0.44–1.00)
GFR calc Af Amer: >60 mL/min (ref >60)
GFR calc non Af Amer: >60 mL/min (ref >60)
Glucose, Bld: 98 mg/dL (ref 70–99)
Potassium: 4.3 mmol/L (ref 3.5–5.1)
Sodium: 137 mmol/L (ref 135–145)

## 2019-10-27 LAB — CBC
HCT: 38.1 % (ref 36.0–46.0)
Hemoglobin: 12 g/dL (ref 12.0–15.0)
MCH: 31.4 pg (ref 26.0–34.0)
MCHC: 31.5 g/dL (ref 30.0–36.0)
MCV: 99.7 fL (ref 80.0–100.0)
Platelets: 175 10*3/uL (ref 150–400)
RBC: 3.82 MIL/uL — ABNORMAL LOW (ref 3.87–5.11)
RDW: 12.9 % (ref 11.5–15.5)
WBC: 7.9 10*3/uL (ref 4.0–10.5)
nRBC: 0 % (ref 0.0–0.2)

## 2019-10-27 LAB — LIPID PANEL
Cholesterol: 119 mg/dL (ref 0–200)
HDL: 49 mg/dL (ref >40)
LDL Cholesterol: 58 mg/dL (ref 0–99)
Total CHOL/HDL Ratio: 2.4 RATIO
Triglycerides: 62 mg/dL (ref <150)
VLDL: 12 mg/dL (ref 0–40)

## 2019-10-27 LAB — HEMOGLOBIN A1C
Hgb A1c MFr Bld: 5.2 % (ref 4.8–5.6)
Mean Plasma Glucose: 102.54 mg/dL

## 2019-10-27 LAB — TSH: TSH: 1.999 u[IU]/mL (ref 0.350–4.500)

## 2019-10-27 MED ORDER — HYDROXYZINE HCL 25 MG PO TABS
25.0000 mg | ORAL_TABLET | Freq: Four times a day (QID) | ORAL | Status: DC | PRN
  Administered 2019-10-28: 25 mg via ORAL
  Filled 2019-10-27: qty 1

## 2019-10-27 MED ORDER — LORAZEPAM 1 MG PO TABS: 1.0000 mg | ORAL_TABLET | Freq: Every day | ORAL | Status: DC

## 2019-10-27 MED ORDER — ONDANSETRON 4 MG PO TBDP: 4.0000 mg | ORAL_TABLET | Freq: Four times a day (QID) | ORAL | Status: DC | PRN

## 2019-10-27 MED ORDER — LACTATED RINGERS IV BOLUS
1000.0000 mL | Freq: Once | INTRAVENOUS | Status: AC
  Administered 2019-10-27: 1000 mL via INTRAVENOUS

## 2019-10-27 MED ORDER — LORAZEPAM 1 MG PO TABS
1.0000 mg | ORAL_TABLET | Freq: Four times a day (QID) | ORAL | Status: AC
  Administered 2019-10-28 – 2019-10-29 (×5): 1 mg via ORAL
  Filled 2019-10-27 (×6): qty 1

## 2019-10-27 MED ORDER — THIAMINE HCL 100 MG PO TABS
100.0000 mg | ORAL_TABLET | Freq: Every day | ORAL | Status: DC
  Administered 2019-10-28 – 2019-10-31 (×4): 100 mg via ORAL
  Filled 2019-10-27 (×7): qty 1

## 2019-10-27 MED ORDER — LORAZEPAM 1 MG PO TABS
1.0000 mg | ORAL_TABLET | Freq: Three times a day (TID) | ORAL | Status: AC
  Administered 2019-10-29 – 2019-10-30 (×3): 1 mg via ORAL
  Filled 2019-10-27 (×2): qty 1

## 2019-10-27 MED ORDER — LORAZEPAM 1 MG PO TABS: 1.0000 mg | ORAL_TABLET | Freq: Two times a day (BID) | ORAL | Status: DC

## 2019-10-27 MED ORDER — ADULT MULTIVITAMIN W/MINERALS CH
1.0000 | ORAL_TABLET | Freq: Every day | ORAL | Status: DC
  Administered 2019-10-28 – 2019-10-31 (×4): 1 via ORAL
  Filled 2019-10-27 (×6): qty 1

## 2019-10-27 MED ORDER — LOPERAMIDE HCL 2 MG PO CAPS: 2.0000 mg | ORAL_CAPSULE | ORAL | Status: DC | PRN

## 2019-10-27 MED ORDER — THIAMINE HCL 100 MG/ML IJ SOLN: 100.0000 mg | Freq: Once | INTRAMUSCULAR | Status: DC

## 2019-10-27 MED ORDER — LORAZEPAM 1 MG PO TABS
1.0000 mg | ORAL_TABLET | Freq: Four times a day (QID) | ORAL | Status: AC | PRN
  Administered 2019-10-27 – 2019-10-30 (×2): 1 mg via ORAL
  Filled 2019-10-27 (×2): qty 1

## 2019-10-27 NOTE — ED Notes (Signed)
Attempted Report to [facility]. Receiving RN will call back when available.

## 2019-10-27 NOTE — Progress Notes (Deleted)
Called report to [facility] for this pt. Pt experiencing low blood pressure this morning with the following symptoms: diaphoresis, pale clammy skin. Pt IVC. Will be transported by EMS with escort by [employer].

## 2019-10-27 NOTE — Progress Notes (Signed)
Pt reports that one of her triggers for her suicide was an argument that she had with her boyfriend. Other stressors include taking care of her children and being unemployed. Pt has been resting in her bed tonight. She denies SI/HI and AVH. Active listening, reassurance, and support provided. Medications administered as ordered by MD. Q 15 min safety checks continue. Pt's safety has been maintained.    10/26/19 2020  Psych Admission Type (Psych Patients Only)  Admission Status Involuntary  Psychosocial Assessment  Patient Complaints Anxiety;Depression;Isolation;Substance abuse  Eye Contact Brief  Facial Expression Anxious;Sad;Worried  Affect Anxious;Depressed;Sad  Speech Logical/coherent  Interaction Minimal;Isolative  Motor Activity Slow  Appearance/Hygiene Disheveled  Behavior Characteristics Anxious;Calm  Mood Depressed;Anxious;Sad  Thought Process  Coherency WDL  Content WDL  Delusions None reported or observed  Perception WDL  Hallucination None reported or observed  Judgment Poor  Confusion None  Danger to Self  Current suicidal ideation? Denies  Danger to Others  Danger to Others None reported or observed

## 2019-10-27 NOTE — ED Notes (Addendum)
Patient sent back to [facility] with previous papers per MD.

## 2019-10-27 NOTE — Progress Notes (Signed)
Pt c/o not feeling well this morning while sitting in the dayroom. Therefore, pt was walked over and seated to get her vital signs taken. Pt is hypotensive this morning, her blood pressure was 53/35 sitting and her pulse was 54 at 0635. Pt was sweating profusely, was pale in color, skin was clammy, and pt was disoriented. PO fluids were being encouraged, but pt was losing consciousness and then she started moaning loudly. Gatorade was being given orally. BP gradually increased to 90/59 at 0638 with a pulse of 75, but then decreased to 73/47 with a pulse of 78 at 0641. Last BP before [facility] arrived was 102/55 sitting with a pulse of 76 at 0645. NP, Adaku Anike was updated about pt's status and ordered for her to be sent out via emergent transportation to the ER for evaluation. [facility] arrived and transported pt via stretcher.

## 2019-10-27 NOTE — ED Provider Notes (Signed)
[facility]-EMERGENCY DEPT Provider Note   CSN: [id number] Arrival date & time: 10/27/19  0865     History Chief Complaint  Patient presents with  . Fatigue    Patty Mata is a 29 y.o. female.  HPI Patient presents from behavioral health.  Reportedly was feeling lightheaded.  Blood pressure found to be low.  Given oral fluids but continued to have low blood pressure.  States has had good oral intake.  At behavioral health for depression and substance abuse.  Medically cleared 2 days ago before transfer.  No diarrhea.  No fevers.  No coughing.  States she just kind of feels bad all over.    Past Medical History:  Diagnosis Date  . Anxiety   . Depression   . History of kidney stones   . Kidney stones     Patient Active Problem List   Diagnosis Date Noted  . Severe recurrent major depression without psychotic features (HCC) 10/26/2019  . Alcohol-induced mood disorder (HCC)   . Cholestasis 03/03/2018  . Elevated liver function tests 02/19/2018  . No leakage of amniotic fluid into vagina 01/07/2018  . Rubella non-immune status, antepartum 11/04/2017  . Frequent UTI 10/30/2017  . Anxiety and depression 10/30/2017    Past Surgical History:  Procedure Laterality Date  . STENT PLACEMENT NON-VASCULAR ([facility] HX)     renal  . TUBAL LIGATION Bilateral 03/06/2018   Procedure: POST PARTUM TUBAL LIGATION;  Surgeon: Lesly Dukes, MD;  Location: [facility] BIRTHING SUITES;  Service: Gynecology;  Laterality: Bilateral;     OB History    Gravida  2   Para  2   Term  2   Preterm      AB      Living  2     SAB      TAB      Ectopic      Multiple  0   Live Births  2           Family History  Problem Relation Age of Onset  . Depression Mother   . Arthritis Mother   . Asthma Father   . Mental illness Father   . Cancer Maternal Grandfather     Social History   Tobacco Use  . Smoking status: Current Every Day Smoker    Packs/day: 1.00     Years: 5.00    Pack years: 5.00    Types: Cigarettes  . Smokeless tobacco: Never Used  Vaping Use  . Vaping Use: Never used  Substance Use Topics  . Alcohol use: Yes    Comment: one pint whiskey daily  . Drug use: Not Currently    Home Medications Prior to Admission medications   Medication Sig Start Date End Date Taking? Authorizing Provider  ALPRAZolam Prudy Feeler) 1 MG tablet Take 1 mg by mouth 3 (three) times daily as needed for anxiety.    [provider]  docusate sodium (COLACE) 100 MG capsule Take 1 capsule (100 mg total) by mouth every 12 (twelve) hours. Patient not taking: Reported on 10/25/2019 07/14/19   Renne Crigler, PA-C  glycerin adult 2 g suppository Place 1 suppository rectally as needed for constipation. Patient not taking: Reported on 10/25/2019 07/14/19   Renne Crigler, PA-C  polyethylene glycol powder (GLYCOLAX/MIRALAX) 17 GM/SCOOP powder Take 17 g by mouth daily. Patient not taking: Reported on 10/25/2019 07/14/19   Renne Crigler, PA-C  amLODipine (NORVASC) 5 MG tablet Take 1 tablet (5 mg total) by mouth  daily. Patient not taking: Reported on 12/05/2018 03/07/18 07/14/19  Tilda Burrow, MD    Allergies    Penicillins  Review of Systems   Review of Systems  Constitutional: Negative for appetite change and fatigue.  Respiratory: Negative for shortness of breath.   Cardiovascular: Negative for chest pain.  Gastrointestinal: Negative for abdominal pain.  Genitourinary: Negative for flank pain.  Skin: Negative for rash.  Neurological: Positive for light-headedness. Negative for weakness.  Psychiatric/Behavioral: Negative for confusion.    Physical Exam Updated Vital Signs BP 119/80   Pulse 101   Temp (!) 97.5 F (36.4 C) (Oral)   Resp 16   Ht 5\' 2"  (1.575 m)   Wt 52.2 kg   LMP 10/23/2019 (Exact Date)   SpO2 100%   BMI 21.03 kg/m   Physical Exam Vitals and nursing note reviewed.  HENT:     Head: Atraumatic.  Eyes:     Extraocular Movements:  Extraocular movements intact.  Cardiovascular:     Rate and Rhythm: Regular rhythm.  Pulmonary:     Breath sounds: No wheezing or rhonchi.  Abdominal:     Tenderness: There is no abdominal tenderness.  Musculoskeletal:     Cervical back: Neck supple.     Left lower leg: No edema.  Skin:    General: Skin is warm.     Capillary Refill: Capillary refill takes less than 2 seconds.  Neurological:     Mental Status: She is alert and oriented to person, place, and time.     ED Results / Procedures / Treatments   Labs (all labs ordered are listed, but only abnormal results are displayed) Labs Reviewed  CBC - Abnormal; Notable for the following components:      Result Value   RBC 3.82 (*)    All other components within normal limits  BASIC METABOLIC PANEL - Abnormal; Notable for the following components:   Calcium 8.2 (*)    All other components within normal limits  URINALYSIS, ROUTINE W REFLEX MICROSCOPIC - Abnormal; Notable for the following components:   Hgb urine dipstick LARGE (*)    Leukocytes,Ua LARGE (*)    Bacteria, UA RARE (*)    All other components within normal limits  URINE CULTURE  LIPID PANEL  TSH  HEMOGLOBIN A1C    EKG None  Radiology No results found.  Procedures Procedures (including critical care time)  Medications Ordered in ED Medications  acetaminophen (TYLENOL) tablet 650 mg (650 mg Oral Given 10/27/19 1010)  traZODone (DESYREL) tablet 50 mg (has no administration in time range)  thiamine tablet 100 mg (100 mg Oral Given 10/27/19 1011)  multivitamin with minerals tablet 1 tablet (1 tablet Oral Given 10/27/19 1010)  LORazepam (ATIVAN) tablet 1 mg (has no administration in time range)  hydrOXYzine (ATARAX/VISTARIL) tablet 25 mg (25 mg Oral Given 10/26/19 1527)  LORazepam (ATIVAN) tablet 1 mg (1 mg Oral Given 10/27/19 1243)    Followed by  LORazepam (ATIVAN) tablet 1 mg (has no administration in time range)    Followed by  LORazepam (ATIVAN) tablet  1 mg (has no administration in time range)    Followed by  LORazepam (ATIVAN) tablet 1 mg (has no administration in time range)  sertraline (ZOLOFT) tablet 25 mg (0 mg Oral Hold 10/27/19 1231)  nicotine (NICODERM CQ - dosed in mg/24 hours) patch 21 mg (21 mg Transdermal Patch Applied 10/27/19 1010)  thiamine (B-1) injection 100 mg (100 mg Intramuscular Given 10/26/19 1526)  lactated ringers bolus  1,000 mL (0 mLs Intravenous Stopped 10/27/19 0958)  lactated ringers bolus 1,000 mL (1,000 mLs Intravenous New Bag/Given 10/27/19 1335)    ED Course  I have reviewed the triage vital signs and the nursing notes.  Pertinent labs & imaging results that were available during my care of the patient were reviewed by me and considered in my medical decision making (see chart for details).    MDM Rules/Calculators/A&P                          Patient presents with hypotension from behavioral health.  Fluid boluses given and patient feels much better.  Lab work reassuring.  Urine shows white cells without much bacteria.  Does not have any dysuria so I think not urinary tract infection.  Also had some mild pain in the right antecubital area.  Could be a phlebitis from injecting drugs do not see abscess.  Think likely superficial not DVT.  Will send back to behavioral health.  Urine culture has been sent and can be followed.  Do not think the hypotension is a sepsis due to urinary tract infection. Final Clinical Impression(s) / ED Diagnoses Final diagnoses:  Hypotension, unspecified hypotension type    Rx / DC Orders ED Discharge Orders    None       Benjiman Core, MD 10/27/19 1422

## 2019-10-27 NOTE — ED Triage Notes (Signed)
[facility] transported pt from [facility] and reports the following:  Pt woke up this morning with weakness and dizziness. Palm, cool, clammy upon EMS arrival. [facility] reports hypotension and EMS does not. Arrived at [facility] yesterday. No ETOH since 7/25. Taking lorazepam and thiamine at [facility].

## 2019-10-27 NOTE — Progress Notes (Signed)
This provider was called by patient's nurse about pt's low BP of 68/42 at 6:35am. Pt was given fluids orally but BP remained low at 79/55. Pt was sent over to [facility] for medical clearance.

## 2019-10-27 NOTE — Progress Notes (Signed)
Called report to [facility] for this pt. Pt experiencing low blood pressure this morning with the following symptoms: diaphoresis, pale,clammy skin. Pt IVC. Will be transported by EMS with escort by [employer].

## 2019-10-27 NOTE — ED Notes (Signed)
Transportation arranged with [employer].

## 2019-10-27 NOTE — Progress Notes (Signed)
   10/26/19 2020  COVID-19 Daily Checkoff  Have you had a fever (temp > 37.80C/100F)  in the past 24 hours?  No  COVID-19 EXPOSURE  Have you traveled outside the state in the past 14 days? No  Have you been in contact with someone with a confirmed diagnosis of COVID-19 or PUI in the past 14 days without wearing appropriate PPE? No  Have you been living in the same home as a person with confirmed diagnosis of COVID-19 or a PUI (household contact)? No  Have you been diagnosed with COVID-19? No

## 2019-10-28 DIAGNOSIS — F1920 Other psychoactive substance dependence, uncomplicated: Secondary | ICD-10-CM

## 2019-10-28 LAB — URINE CULTURE: Culture: <10000 — AB

## 2019-10-28 MED ORDER — HYDROXYZINE HCL 25 MG PO TABS
25.0000 mg | ORAL_TABLET | Freq: Three times a day (TID) | ORAL | Status: DC | PRN
  Administered 2019-10-28 – 2019-10-30 (×3): 25 mg via ORAL
  Filled 2019-10-28 (×4): qty 1

## 2019-10-28 MED ORDER — SERTRALINE HCL 25 MG PO TABS
25.0000 mg | ORAL_TABLET | Freq: Every day | ORAL | Status: DC
  Administered 2019-10-29 – 2019-10-31 (×3): 25 mg via ORAL
  Filled 2019-10-28 (×5): qty 1

## 2019-10-28 MED ORDER — NICOTINE 21 MG/24HR TD PT24
21.0000 mg | MEDICATED_PATCH | Freq: Every day | TRANSDERMAL | Status: DC
  Administered 2019-10-28 – 2019-10-30 (×3): 21 mg via TRANSDERMAL
  Filled 2019-10-28 (×6): qty 1
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
## 2019-10-28 NOTE — Progress Notes (Signed)
[facility]  10/28/2019 5:38 PM Patty LewandowskyKeely M Mata  MRN:  [id number] Subjective:  Currently she reports she is feeling better than yesterday. She denies current symptoms of WDL. At this time denies any suicidal or self injurious ideations and presents future oriented, hoping to discharge soon.    Objective: [age] y old female, lives with BF. Presented on 7/26 after  episode of self cutting on forearm. She reported worsening depression and endorsed neuro-vegetative symptoms , but denied having suicidal ideations leading up to admission. She had been drinking daily , up to a bottle of whiskey per day, was buying Xanax /taking 1-2 mgrs daily, and abusing amphetamines intermittently. Admission BAL was negative and she reported last drank 1-2 days prior to admission.   Yesterday morning patient had episode of hypotension,  with associated pallor and diaphoresis. At the time was sent to ED , where she was given IV hydration. 7/27 EKG NSR , HR 91, QTc 437.  7/28 labs- Na+ 137, K+ 4.3, Creatinine 0.51, BUN 14. 7/28 UA was (+) for leukocytes , but rare bacteria and negative nitrites. Urine Cx  Negative ( <10,000 colonies ) and patient denies dysuria or urgency, no fever or chills    Vital signs- 122/85, pulse 103 sitting, 121/88, pulse 130 standing   Today patient reports she is feeling partially  better, and currently does not endorse significant symptoms of alcohol WDL.  Not tremulous, not diaphoretic, presents calm and in no acute distress . She is currently on Ativan taper for alcohol/BZD WDL. She remains vaguely depressed, dysphoric and is expressing interest in restarting Zoloft , which she took for one year  in the past, with good response and without side effects. Today  denies suicidal ideations at this time and presents more future oriented, hoping for discharge soon.    Principal Problem:  Alcohol Use Disorder, BZD Use Disorder, Stimulant Use Disorder, Substance Induced Mood Disorder versus  MDD Diagnosis:  Total Time spent with patient: 20 minutes  Past Psychiatric History:   Past Medical History:  Past Medical History:  Diagnosis Date  . Anxiety   . Depression   . History of kidney stones   . Kidney stones     Past Surgical History:  Procedure Laterality Date  . STENT PLACEMENT NON-VASCULAR ([facility] HX)     renal  . TUBAL LIGATION Bilateral 03/06/2018   Procedure: POST PARTUM TUBAL LIGATION;  Surgeon: Lesly DukesLeggett, Kelly H, MD;  [facility];  Service: Gynecology;  Laterality: Bilateral;   Family History:  Family History  Problem Relation Age of Onset  . Depression Mother   . Arthritis Mother   . Asthma Father   . Mental illness Father   . Cancer Maternal Grandfather    Family Psychiatric  History:  Social History:  Social History   Substance and Sexual Activity  Alcohol Use Yes   Comment: one pint whiskey daily     Social History   Substance and Sexual Activity  Drug Use Not Currently    Social History   Socioeconomic History  . Marital status: Single    Spouse name: Not on file  . Number of children: 2  . Years of education: 7613  . Highest education level: Not on file  Occupational History  . Not on file  Tobacco Use  . Smoking status: Current Every Day Smoker    Packs/day: 1.00    Years: 5.00    Pack years: 5.00    Types: Cigarettes  .  Smokeless tobacco: Never Used  Vaping Use  . Vaping Use: Never used  Substance and Sexual Activity  . Alcohol use: Yes    Comment: one pint whiskey daily  . Drug use: Not Currently  . Sexual activity: Yes    Birth control/protection: None  Other Topics Concern  . Not on file  Social History Narrative  . Not on file   Social Determinants of Health   Financial Resource Strain:   . Difficulty of Paying Living Expenses:   Food Insecurity:   . Worried About Programme researcher, broadcasting/film/video in the Last Year:   . Barista in the Last Year:   Transportation Needs:   . Freight forwarder  (Medical):   Marland Kitchen Lack of Transportation (Non-Medical):   Physical Activity:   . Days of Exercise per Week:   . Minutes of Exercise per Session:   Stress:   . Feeling of Stress :   Social Connections:   . Frequency of Communication with Friends and Family:   . Frequency of Social Gatherings with Friends and Family:   . Attends Religious Services:   . Active Member of Clubs or Organizations:   . Attends Banker Meetings:   Marland Kitchen Marital Status:    Additional Social History:   Sleep: fair- improving  Appetite:  improving   Current Medications: Current Facility-Administered Medications  Medication Dose Route Frequency Provider Last Rate Last Admin  . hydrOXYzine (ATARAX/VISTARIL) tablet 25 mg  25 mg Oral Q6H PRN Cali Cuartas, Rockey Situ, MD   25 mg at 10/28/19 1033  . loperamide (IMODIUM) capsule 2-4 mg  2-4 mg Oral PRN Dione Mccombie, Rockey Situ, MD      . LORazepam (ATIVAN) tablet 1 mg  1 mg Oral Q6H PRN Vaanya Shambaugh, Rockey Situ, MD   1 mg at 10/27/19 2152  . LORazepam (ATIVAN) tablet 1 mg  1 mg Oral QID Ory Elting, Rockey Situ, MD   1 mg at 10/28/19 1635   Followed by  . [START ON 10/29/2019] LORazepam (ATIVAN) tablet 1 mg  1 mg Oral TID Kyrstan Gotwalt, Rockey Situ, MD       Followed by  . [START ON 10/30/2019] LORazepam (ATIVAN) tablet 1 mg  1 mg Oral BID Tishawna Larouche, Rockey Situ, MD       Followed by  . [START ON 11/01/2019] LORazepam (ATIVAN) tablet 1 mg  1 mg Oral Daily Ledia Hanford A, MD      . multivitamin with minerals tablet 1 tablet  1 tablet Oral Daily Zelena Bushong, Rockey Situ, MD   1 tablet at 10/28/19 0854  . nicotine (NICODERM CQ - dosed in mg/24 hours) patch 21 mg  21 mg Transdermal Daily Alura Olveda, Rockey Situ, MD   21 mg at 10/28/19 1036  . ondansetron (ZOFRAN-ODT) disintegrating tablet 4 mg  4 mg Oral Q6H PRN Brynlie Daza A, MD      . thiamine (B-1) injection 100 mg  100 mg Intramuscular Once Deklyn Trachtenberg A, MD      . thiamine tablet 100 mg  100 mg Oral Daily Tatayana Beshears, Rockey Situ, MD   100 mg at 10/28/19 3716     Lab Results:  Results for orders placed or performed during the hospital encounter of 10/26/19 (from the past 48 hour(s))  Lipid panel     Status: None   Collection Time: 10/27/19  8:14 AM  Result Value Ref Range   Cholesterol 119 0 - 200 mg/dL   Triglycerides 62 <967 mg/dL   HDL 49 >  40 mg/dL   Total CHOL/HDL Ratio 2.4 RATIO   VLDL 12 0 - 40 mg/dL   LDL Cholesterol 58 0 - 99 mg/dL    Comment:        Total Cholesterol/HDL:CHD Risk Coronary Heart Disease Risk Table                     Men   Women  1/2 Average Risk   3.4   3.3  Average Risk       5.0   4.4  2 X Average Risk   9.6   7.1  3 X Average Risk  23.4   11.0        Use the calculated Patient Ratio above and the CHD Risk Table to determine the patient's CHD Risk.        ATP III CLASSIFICATION (LDL):  <100     mg/dL   Optimal  607-371  mg/dL   Near or Above                    Optimal  130-159  mg/dL   Borderline  062-694  mg/dL   High  >854     mg/dL   Very High Performed at [facility], [address] [place], [place] [address]   TSH     Status: None   Collection Time: 10/27/19  8:14 AM  Result Value Ref Range   TSH 1.999 0.350 - 4.500 uIU/mL    Comment: Performed by a 3rd Generation assay with a functional sensitivity of <=0.01 uIU/mL. Performed at [facility], [address], [place], [place] [address]   Hemoglobin A1c     Status: None   Collection Time: 10/27/19  8:14 AM  Result Value Ref Range   Hgb A1c MFr Bld 5.2 4.8 - 5.6 %    Comment: (NOTE) Pre diabetes:          5.7%-6.4%  Diabetes:              >6.4%  Glycemic control for   <7.0% adults with diabetes    Mean Plasma Glucose 102.54 mg/dL    Comment: Performed at [facility], [address], [place], [place] [address]  CBC     Status: Abnormal   Collection Time: 10/27/19  8:14 AM  Result Value Ref Range   WBC 7.9 4.0 - 10.5 K/uL   RBC 3.82 (L) 3.87 - 5.11 MIL/uL   Hemoglobin 12.0 12.0 - 15.0  g/dL   HCT 93.7 36 - 46 %   MCV 99.7 80.0 - 100.0 fL   MCH 31.4 26.0 - 34.0 pg   MCHC 31.5 30.0 - 36.0 g/dL   RDW 16.9 67.8 - 93.8 %   Platelets 175 150 - 400 K/uL   nRBC 0.0 0.0 - 0.2 %    Comment: Performed at [facility], [address] [place], [place] [address]  Basic metabolic panel     Status: Abnormal   Collection Time: 10/27/19  8:14 AM  Result Value Ref Range   Sodium 137 135 - 145 mmol/L   Potassium 4.3 3.5 - 5.1 mmol/L   Chloride 106 98 - 111 mmol/L   CO2 24 22 - 32 mmol/L   Glucose, Bld 98 70 - 99 mg/dL    Comment: Glucose reference range applies only to samples taken after fasting for at least 8 hours.   BUN 14 6 - 20 mg/dL  Creatinine, Ser 0.51 0.44 - 1.00 mg/dL   Calcium 8.2 (L) 8.9 - 10.3 mg/dL   GFR calc non Af Amer >60 >60 mL/min   GFR calc Af Amer >60 >60 mL/min   Anion gap 7 5 - 15    Comment: Performed at [facility], [address] [place], [place] [address]  Urinalysis, Routine w reflex microscopic     Status: Abnormal   Collection Time: 10/27/19  9:28 AM  Result Value Ref Range   Color, Urine YELLOW YELLOW   APPearance CLEAR CLEAR   Specific Gravity, Urine 1.012 1.005 - 1.030   pH 6.0 5.0 - 8.0   Glucose, UA NEGATIVE NEGATIVE mg/dL   Hgb urine dipstick LARGE (A) NEGATIVE   Bilirubin Urine NEGATIVE NEGATIVE   Ketones, ur NEGATIVE NEGATIVE mg/dL   Protein, ur NEGATIVE NEGATIVE mg/dL   Nitrite NEGATIVE NEGATIVE   Leukocytes,Ua LARGE (A) NEGATIVE   RBC / HPF 6-10 0 - 5 RBC/hpf   WBC, UA 21-50 0 - 5 WBC/hpf   Bacteria, UA RARE (A) NONE SEEN   Squamous Epithelial / LPF 0-5 0 - 5   Mucus PRESENT    Hyaline Casts, UA PRESENT     Comment: Performed at [facility], [address] [place], [place] [address]  Urine culture     Status: Abnormal   Collection Time: 10/27/19  9:28 AM   Specimen: Urine, Clean Catch  Result Value Ref Range   Specimen Description      URINE, CLEAN CATCH Performed  at [facility], [address] [place], [place] [address]    Special Requests      NONE Performed at [facility], [address], [place], [place] [address]    Culture (A)     <10,000 COLONIES/mL INSIGNIFICANT GROWTH Performed at [facility], [address], [place], [place] [address]    Report Status 10/28/2019 FINAL     Blood Alcohol level:  Lab Results  Component Value Date   ETH <10 10/25/2019   ETH 21 (H) 03/15/2019    Metabolic Disorder Labs: Lab Results  Component Value Date   HGBA1C 5.2 10/27/2019   MPG 102.54 10/27/2019   No results found for: PROLACTIN Lab Results  Component Value Date   CHOL 119 10/27/2019   TRIG 62 10/27/2019   HDL 49 10/27/2019   CHOLHDL 2.4 10/27/2019   VLDL 12 10/27/2019   LDLCALC 58 10/27/2019    Physical Findings: AIMS: Facial and Oral Movements Muscles of Facial Expression: None, normal Lips and Perioral Area: None, normal Jaw: None, normal Tongue: None, normal,Extremity Movements Upper (arms, wrists, hands, fingers): None, normal Lower (legs, knees, ankles, toes): None, normal, Trunk Movements Neck, shoulders, hips: None, normal, Overall Severity Severity of abnormal movements (highest score from questions above): None, normal Incapacitation due to abnormal movements: None, normal Patient's awareness of abnormal movements (rate only patient's report): No Awareness, Dental Status Current problems with teeth and/or dentures?: No Does patient usually wear dentures?: No  CIWA:  CIWA-Ar Total: 7 COWS:  COWS Total Score: 3  Musculoskeletal: Strength & Muscle Tone: within normal limits- no tremors, no diaphoresis or restlessness at this time Gait & Station: normal Patient leans: N/A  Psychiatric Specialty Exam: Physical Exam  Review of Systems no lightheadedness or dizziness today, no headache, no chest pain, no shortness of breath, no vomiting , no diarrhea, no rash   Blood  pressure (!) 121/88, pulse (!) 130, temperature (!)  97.4 F (36.3 C), temperature source Oral, resp. rate 18, last menstrual period 10/25/2019, SpO2 99 %, unknown if currently breastfeeding.There is no height or weight on file to calculate BMI.  General Appearance: improving grooming   Eye Contact:  Good  Speech:  Normal Rate  Volume:  Decreased  Mood:  reports partially improved mood   Affect:  less constricted, smiles briefly at times  Thought Process:  Linear and Descriptions of Associations: Intact  Orientation:  Full (Time, Place, and Person)  Thought Content:  no hallucinations, no delusions, not internally preoccupied   Suicidal Thoughts:  No no suicidal or self injurious ideations, no homicidal or violent ideations, currently contracts for safety   Homicidal Thoughts:  No  Memory:  recent and remote grossly intact   Judgement:  Fair/ improving   Insight:  Fair  Psychomotor Activity:  Normal- no tremors, no diaphoresis  Concentration:  Concentration: Good and Attention Span: Good  Recall:  Good  Fund of Knowledge:  Good  Language:  Good  Akathisia:  Negative  Handed:  Right  AIMS (if indicated):     Assets:  Communication Skills Desire for Improvement Resilience  ADL's:  Intact  Cognition:  WNL  Sleep:  Number of Hours: 6.75   Assessment -  57 y old female, lives with BF. Presented on 7/26 after  episode of self cutting on forearm. She reported worsening depression and endorsed neuro-vegetative symptoms , but denied having suicidal ideations leading up to admission. She had been drinking daily , up to a bottle of whiskey per day, was buying Xanax /taking 1-2 mgrs daily, and abusing amphetamines intermittently. Admission BAL was negative and she reported last drank 1-2 days prior to admission.   Yesterday morning patient had episode of hypotension, with associated pallor and diaphoresis. At the time was sent to ED , where she was given IV hydration.  Today patient is  presenting with improving mood , states she is feeling better. She does endorse residual depression/anxiety and is interested in restarting Zoloft , which she took for a period of time in the past, without side effects.( Zoloft not consider likely  to cause/worsen hypotension or orthostasis) Currently she is not endorsing symptoms of Alcohol/BZD WDL and she is not presenting tremulous or diaphoretic at this time .    Treatment Plan Summary: Daily contact with patient to assess and evaluate symptoms and progress in treatment, Medication management, Plan inpatient treatment  and medications as below   Treatment Plan reviewed as below today 7/29 Treatment team working on disposition planning options Encourage group and milieu participation Encourage efforts to work on sobriety and relapse prevention  Continue Ativan WDL protocol - Ativan taper  for alcohol WDL  Continue Thiamine and MVI supplement  Start Zoloft 25 mgrs QDAY for depression Check vitals ( sitting, standing ) Q 6 hours . Encourage PO fluids.  Craige Cotta, MD 10/28/2019, 5:38 PM

## 2019-10-28 NOTE — [facility] Suicide Risk Assessment (Signed)
[facility] INPATIENT:  Family/Significant Other Suicide Prevention Education  Suicide Prevention Education:  Patient Refusal for Family/Significant Other Suicide Prevention Education: The patient Patty Mata has refused to provide written consent for family/significant other to be provided Family/Significant Other Suicide Prevention Education during admission and/or prior to discharge.  Physician notified.  SPE completed with pt, as pt refused to consent to family contact. SPI pamphlet provided to pt and pt was encouraged to share information with support network, ask questions, and talk about any concerns relating to SPE. Pt denies access to guns/firearms and verbalized understanding of information provided. Mobile Crisis information also provided to pt.    Harden Mo 10/28/2019, 10:24 AM

## 2019-10-28 NOTE — Progress Notes (Signed)
HR up to 130.  Pt reported feeling anxious.  Ativan administered.

## 2019-10-28 NOTE — Progress Notes (Signed)
Cooperative with treatment, visible in the the milieu during the evening, patient requested trazodone but education given to patient why it was not ordered.  She remains sad and depressed. Patient in bed resting quietly at this time.

## 2019-10-28 NOTE — Progress Notes (Addendum)
Pt denies SI/HI/AVH.  Reported that she is anxious and depressed.  Vitals have been stable so far this shift.  Pt saying she is ready to go home.  Pt said she will talk to MD about discharge plan and inquiring about medication to help her sleep. RN will continue to monitor and provide support as  needed.

## 2019-10-28 NOTE — Plan of Care (Signed)
Patient has been in the dayroom with peers. Pleasant and cooperative. Denying thoughts of self-harm and expressing remorse related to previous self harm behaviors. Patient reports feeling improvement and expresses motivation for after care services. Patient is active in the milieu and maintains expected behavior. Support and encouragements provided. Safety monitored as expected.

## 2019-10-29 DIAGNOSIS — F1020 Alcohol dependence, uncomplicated: Secondary | ICD-10-CM

## 2019-10-29 MED ORDER — IBUPROFEN 400 MG PO TABS
400.0000 mg | ORAL_TABLET | Freq: Four times a day (QID) | ORAL | Status: DC | PRN
  Administered 2019-10-29 – 2019-10-30 (×2): 400 mg via ORAL
  Filled 2019-10-29 (×2): qty 1

## 2019-10-29 NOTE — Plan of Care (Signed)
Progress note  D: pt found in bed; compliant with medication administration. Pt presents anxious, guarded, fidgety and minimal. Pt has complaints of anxiety, agitation, and a headache from detox symptoms. Pt also has complaints of insomnia. Pt is minimal in in assessment. Pt continues to isolate to their room. Pt denies si/hi/ah/vh and verbally agrees to approach staff if these become apparent or before harming themself/others while at [facility].  A: Pt provided support and encouragement. Pt given medication per protocol and standing orders. Q42m safety checks implemented and continued.  R: Pt safe on the unit. Will continue to monitor.  Pt progressing in the following metrics  Problem: Education: Goal: Knowledge of [facility] Education information/materials will improve Outcome: Progressing   Problem: Coping: Goal: Ability to verbalize frustrations and anger appropriately will improve Outcome: Progressing Goal: Ability to demonstrate self-control will improve Outcome: Progressing   Problem: Health Behavior/Discharge Planning: Goal: Compliance with treatment plan for underlying cause of condition will improve Outcome: Progressing

## 2019-10-29 NOTE — Progress Notes (Signed)
Recreation Therapy Notes  Date: 7.30.21 Time: 0930 Location: [address] Dayroom  Group Topic: Stress Management  Goal Area(s) Addresses:  Patient will identify positive stress management techniques. Patient will identify benefits of using stress management post d/c.  Intervention: Stress Management  Activity:  Meditation.  LRT played a meditation that focused on making choices.  Patients were to listen and follow along as meditation was played to engage in activity.    Education:  Stress Management, Discharge Planning.   Education Outcome: Acknowledges Education  Clinical Observations/Feedback: Pt did not attend group activity.    Caroll Rancher, LRT/CTRS         Caroll Rancher A 10/29/2019 11:29 AM

## 2019-10-29 NOTE — Tx Team (Cosign Needed)
Interdisciplinary Treatment and Diagnostic Plan Update  10/29/2019 Time of Session: [place] MRN: [id number]  Principal Diagnosis: <principal problem not specified>  Secondary Diagnoses: Active Problems:   * No active hospital problems. *   Current Medications:  Current Facility-Administered Medications  Medication Dose Route Frequency Provider Last Rate Last Admin  . hydrOXYzine (ATARAX/VISTARIL) tablet 25 mg  25 mg Oral Q8H PRN Cobos, Myer Peer, MD   25 mg at 10/28/19 1845  . LORazepam (ATIVAN) tablet 1 mg  1 mg Oral Q6H PRN Cobos, Myer Peer, MD   1 mg at 10/27/19 2152  . LORazepam (ATIVAN) tablet 1 mg  1 mg Oral TID Cobos, Myer Peer, MD   1 mg at 10/29/19 1240   Followed by  . [START ON 10/30/2019] LORazepam (ATIVAN) tablet 1 mg  1 mg Oral BID Cobos, Myer Peer, MD       Followed by  . [START ON 11/01/2019] LORazepam (ATIVAN) tablet 1 mg  1 mg Oral Daily Cobos, Fernando A, MD      . multivitamin with minerals tablet 1 tablet  1 tablet Oral Daily Cobos, Myer Peer, MD   1 tablet at 10/29/19 0815  . nicotine (NICODERM CQ - dosed in mg/24 hours) patch 21 mg  21 mg Transdermal Daily Cobos, Myer Peer, MD   21 mg at 10/29/19 0815  . sertraline (ZOLOFT) tablet 25 mg  25 mg Oral Daily Cobos, Myer Peer, MD   25 mg at 10/29/19 0815  . thiamine (B-1) injection 100 mg  100 mg Intramuscular Once Cobos, Fernando A, MD      . thiamine tablet 100 mg  100 mg Oral Daily Cobos, Myer Peer, MD   100 mg at 10/29/19 0815   PTA Medications: Medications Prior to Admission  Medication Sig Dispense Refill Last Dose  . ALPRAZolam (XANAX) 1 MG tablet Take 1 mg by mouth 3 (three) times daily as needed for anxiety.     . docusate sodium (COLACE) 100 MG capsule Take 1 capsule (100 mg total) by mouth every 12 (twelve) hours. (Patient not taking: Reported on 10/25/2019) 60 capsule 0   . glycerin adult 2 g suppository Place 1 suppository rectally as needed for constipation. (Patient not taking: Reported  on 10/25/2019) 12 suppository 0   . polyethylene glycol powder (GLYCOLAX/MIRALAX) 17 GM/SCOOP powder Take 17 g by mouth daily. (Patient not taking: Reported on 10/25/2019) 255 g 0     Patient Stressors:    Patient Strengths:    Treatment Modalities: Medication Management, Group therapy, Case management,  1 to 1 session with clinician, Psychoeducation, Recreational therapy.   Physician Treatment Plan for Primary Diagnosis: <principal problem not specified> Long Term Goal(s):     Short Term Goals:    Medication Management: Evaluate patient's response, side effects, and tolerance of medication regimen.  Therapeutic Interventions: 1 to 1 sessions, Unit Group sessions and Medication administration.  Evaluation of Outcomes: Not Met  Physician Treatment Plan for Secondary Diagnosis: Active Problems:   * No active hospital problems. *  Long Term Goal(s):     Short Term Goals:       Medication Management: Evaluate patient's response, side effects, and tolerance of medication regimen.  Therapeutic Interventions: 1 to 1 sessions, Unit Group sessions and Medication administration.  Evaluation of Outcomes: Not Met   RN Treatment Plan for Primary Diagnosis: <principal problem not specified> Long Term Goal(s): Knowledge of disease and therapeutic regimen to maintain health will improve  Short Term Goals: Ability  to remain free from injury will improve, Ability to verbalize frustration and anger appropriately will improve, Ability to demonstrate self-control, Ability to participate in decision making will improve, Ability to verbalize feelings will improve, Ability to disclose and discuss suicidal ideas, Ability to identify and develop effective coping behaviors will improve and Compliance with prescribed medications will improve  Medication Management: RN will administer medications as ordered by provider, will assess and evaluate patient's response and provide education to patient for  prescribed medication. RN will report any adverse and/or side effects to prescribing provider.  Therapeutic Interventions: 1 on 1 counseling sessions, Psychoeducation, Medication administration, Evaluate responses to treatment, Monitor vital signs and CBGs as ordered, Perform/monitor CIWA, COWS, AIMS and Fall Risk screenings as ordered, Perform wound care treatments as ordered.  Evaluation of Outcomes: Not Met   LCSW Treatment Plan for Primary Diagnosis: <principal problem not specified> Long Term Goal(s): Safe transition to appropriate next level of care at discharge, Engage patient in therapeutic group addressing interpersonal concerns.  Short Term Goals: Engage patient in aftercare planning with referrals and resources, Increase social support, Increase ability to appropriately verbalize feelings, Increase emotional regulation, Facilitate acceptance of mental health diagnosis and concerns, Facilitate patient progression through stages of change regarding substance use diagnoses and concerns, Identify triggers associated with mental health/substance abuse issues and Increase skills for wellness and recovery  Therapeutic Interventions: Assess for all discharge needs, 1 to 1 time with Social worker, Explore available resources and support systems, Assess for adequacy in community support network, Educate family and significant other(s) on suicide prevention, Complete Psychosocial Assessment, Interpersonal group therapy.  Evaluation of Outcomes: Not Met   Progress in Treatment: Attending groups: No. Participating in groups: No. Taking medication as prescribed: Yes. Toleration medication: Yes. Family/Significant other contact made: No, will contact:  pt declined Patient understands diagnosis: Yes. Discussing patient identified problems/goals with staff: Yes. Medical problems stabilized or resolved: Yes. Denies suicidal/homicidal ideation: Yes. Issues/concerns per patient self-inventory:  No. Other:   New problem(s) identified: No, Describe:  none  New Short Term/Long Term Goal(s):  Patient Goals:  "Go home" pt also explains she wants a PCP  Discharge Plan or Barriers:   Reason for Continuation of Hospitalization: Medication stabilization  Estimated Length of Stay:  Attendees: Patient: Patty Mata 10/29/2019 1:21 PM  Physician: Neita Garnet 10/29/2019 1:21 PM  Nursing:  10/29/2019 1:21 PM  RN Care Manager: 10/29/2019 1:21 PM  Social Worker:  Macon Large 10/29/2019 1:21 PM  Recreational Therapist:  10/29/2019 1:21 PM  Other:  10/29/2019 1:21 PM  Other:  10/29/2019 1:21 PM  Other: 10/29/2019 1:21 PM    Scribe for Treatment Team: Bethann Berkshire, LCSW 10/29/2019 1:29 PM

## 2019-10-29 NOTE — Progress Notes (Signed)
[facility] MD Progress Note  10/29/2019 1:05 PM Patty Mata  MRN:  [id number]  Subjective: Patty Mata reports, "I'm doing better. I feel like I'm ready to go home. I need to be discharged. I'm not depressed, my medicines are helping".   Objective: 29 y old female, lives with BF. Presented on 10/25/19 after  episode of self cutting on forearm. She reported worsening depression and endorsed neuro-vegetative symptoms, but denied having suicidal ideations leading up to admission. She had been drinking daily, up to a bottle of whiskey per day, was buying Xanax /taking 1-2 mgrs daily, and abusing amphetamines intermittently. Admission BAL was negative and she reported last drank 1-2 days prior to admission. Two days ago, patient had episode of hypotension with associated pallor and diaphoresis. At the time was sent to ED, where she was given IV hydration. 10/26/19: EKG NSR , HR 91, QTc 437.  10/27/19 labs- Na+ 137, K+ 4.3, Creatinine 0.51, BUN 14. 10/27/19 UA was (+) for leukocytes , but rare bacteria and negative nitrites. Urine Cx  Negative ( <10,000 colonies ) and patient denies dysuria or urgency, no fever or chills. Today patient reports she is doing much better. She denies any symptoms of alcohol WDL, depression or anxiety.  Not tremulous, not diaphoretic, presents calm and in no acute distress. She remains on Ativan taper for alcohol/BZD WDL. She  Says she is doing well on Zoloft without any side effects reported. Today, she denies any suicidal ideations at this time & presents more future oriented. She is asking to be discharged as she is feeling better.  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 Diagnosis:  Total Time spent with patient: 15 minutes  Past Psychiatric History: See H&P  Past Medical History:  Past Medical History:  Diagnosis Date   Anxiety    Depression    History of kidney stones    Kidney stones      Past Surgical History:  Procedure Laterality Date   STENT PLACEMENT NON-VASCULAR ([facility] HX)     renal   TUBAL LIGATION Bilateral 03/06/2018   Procedure: POST PARTUM TUBAL LIGATION;  Surgeon: Lesly Dukes, MD;  Location: [facility];  Service: Gynecology;  Laterality: Bilateral;   Family History:  Family History  Problem Relation Age of Onset   Depression Mother    Arthritis Mother    Asthma Father    Mental illness Father    Cancer Maternal Grandfather    Family Psychiatric  History: See H&P  Social History:  Social History   Substance and Sexual Activity  Alcohol Use Yes   Comment: one pint whiskey daily     Social History   Substance and Sexual Activity  Drug Use Not Currently    Social History   Socioeconomic History   Marital status: Single    Spouse name: Not on file   Number of children: 2   Years of education: 13   Highest education level: Not on file  Occupational History   Not on file  Tobacco Use   Smoking status: Current Every Day Smoker    Packs/day: 1.00    Years: 5.00    Pack years: 5.00    Types: Cigarettes   Smokeless tobacco: Never Used  Building services engineer Use: Never used  Substance and Sexual Activity   Alcohol use: Yes    Comment: one pint whiskey daily   Drug use: Not Currently  Sexual activity: Yes    Birth control/protection: None  Other Topics Concern   Not on file  Social History Narrative   Not on file   Social Determinants of Health   Financial Resource Strain:    Difficulty of Paying Living Expenses:   Food Insecurity:    Worried About Programme researcher, broadcasting/film/video in the Last Year:    Barista in the Last Year:   Transportation Needs:    Freight forwarder (Medical):    Lack of Transportation (Non-Medical):   Physical Activity:    Days of Exercise per Week:    Minutes of Exercise per Session:   Stress:    Feeling of Stress :   Social Connections:    Frequency of  Communication with Friends and Family:    Frequency of Social Gatherings with Friends and Family:    Attends Religious Services:    Active Member of Clubs or Organizations:    Attends Banker Meetings:    Marital Status:    Additional Social History:   Sleep: Good  Appetite:  Good  Current Medications: Current Facility-Administered Medications  Medication Dose Route Frequency Provider Last Rate Last Admin   hydrOXYzine (ATARAX/VISTARIL) tablet 25 mg  25 mg Oral Q8H PRN Cobos, Rockey Situ, MD   25 mg at 10/28/19 1845   LORazepam (ATIVAN) tablet 1 mg  1 mg Oral Q6H PRN Cobos, Rockey Situ, MD   1 mg at 10/27/19 2152   LORazepam (ATIVAN) tablet 1 mg  1 mg Oral TID Cobos, Rockey Situ, MD   1 mg at 10/29/19 1240   Followed by   Melene Muller ON 10/30/2019] LORazepam (ATIVAN) tablet 1 mg  1 mg Oral BID Cobos, Rockey Situ, MD       Followed by   Melene Muller ON 11/01/2019] LORazepam (ATIVAN) tablet 1 mg  1 mg Oral Daily Cobos, Rockey Situ, MD       multivitamin with minerals tablet 1 tablet  1 tablet Oral Daily Cobos, Rockey Situ, MD   1 tablet at 10/29/19 0815   nicotine (NICODERM CQ - dosed in mg/24 hours) patch 21 mg  21 mg Transdermal Daily Cobos, Rockey Situ, MD   21 mg at 10/29/19 0815   sertraline (ZOLOFT) tablet 25 mg  25 mg Oral Daily Cobos, Rockey Situ, MD   25 mg at 10/29/19 0815   thiamine (B-1) injection 100 mg  100 mg Intramuscular Once Cobos, Rockey Situ, MD       thiamine tablet 100 mg  100 mg Oral Daily Cobos, Rockey Situ, MD   100 mg at 10/29/19 1638   Lab Results:  No results found for this or any previous visit (from the past 48 hour(s)).  Blood Alcohol level:  Lab Results  Component Value Date   ETH <10 10/25/2019   ETH 21 (H) 03/15/2019   Metabolic Disorder Labs: Lab Results  Component Value Date   HGBA1C 5.2 10/27/2019   MPG 102.54 10/27/2019   No results found for: PROLACTIN Lab Results  Component Value Date   CHOL 119 10/27/2019   TRIG 62 10/27/2019    HDL 49 10/27/2019   CHOLHDL 2.4 10/27/2019   VLDL 12 10/27/2019   LDLCALC 58 10/27/2019   Physical Findings: AIMS: Facial and Oral Movements Muscles of Facial Expression: None, normal Lips and Perioral Area: None, normal Jaw: None, normal Tongue: None, normal,Extremity Movements Upper (arms, wrists, hands, fingers): None, normal Lower (legs, knees, ankles, toes): None, normal, Trunk Movements Neck,  shoulders, hips: None, normal, Overall Severity Severity of abnormal movements (highest score from questions above): None, normal Incapacitation due to abnormal movements: None, normal Patient's awareness of abnormal movements (rate only patient's report): No Awareness, Dental Status Current problems with teeth and/or dentures?: No Does patient usually wear dentures?: No  CIWA:  CIWA-Ar Total: 0 COWS:  COWS Total Score: 3  Musculoskeletal: Strength & Muscle Tone: within normal limits- no tremors, no diaphoresis or restlessness at this time Gait & Station: normal Patient leans: N/A  Psychiatric Specialty Exam: Physical Exam Vitals and nursing note reviewed.  HENT:     Head: Normocephalic.     Nose: Nose normal.     Mouth/Throat:     Pharynx: Oropharynx is clear.  Eyes:     Pupils: Pupils are equal, round, and reactive to light.  Cardiovascular:     Rate and Rhythm: Normal rate.     Pulses: Normal pulses.  Pulmonary:     Effort: Pulmonary effort is normal.  Genitourinary:    Comments: Deferred Musculoskeletal:        General: Normal range of motion.     Cervical back: Normal range of motion.  Skin:    General: Skin is warm and dry.  Neurological:     Mental Status: She is alert and oriented to person, place, and time.     Review of Systems  Constitutional: Negative for chills, diaphoresis and fever.  HENT: Negative for congestion, rhinorrhea and sneezing.   Eyes: Negative for discharge.  Respiratory: Negative for cough, chest tightness, shortness of breath and  wheezing.   Cardiovascular: Negative for chest pain and palpitations.  Gastrointestinal: Negative for diarrhea, nausea and vomiting.  Endocrine: Negative for cold intolerance.  Genitourinary: Negative for difficulty urinating.  Musculoskeletal: Negative for arthralgias and myalgias.  Allergic/Immunologic: Negative for environmental allergies and food allergies.       Allergies: PCN  Neurological: Negative for dizziness, tremors, seizures, syncope, speech difficulty, light-headedness and headaches.  Psychiatric/Behavioral: Positive for dysphoric mood ("My mood is improved"). Negative for agitation, behavioral problems, confusion, decreased concentration, hallucinations, self-injury, sleep disturbance and suicidal ideas. The patient is not nervous/anxious and is not hyperactive.   No lightheadedness or dizziness today, no headache, no chest pain, no shortness of breath, no vomiting , no diarrhea, no rash   Blood pressure 111/83, pulse 92, temperature 98.3 F (36.8 C), temperature source Oral, resp. rate 16, last menstrual period 10/25/2019, SpO2 100 %, unknown if currently breastfeeding.There is no height or weight on file to calculate BMI.  General Appearance: improving grooming   Eye Contact:  Good  Speech:  Normal Rate  Volume:  Decreased  Mood:  Euthymic  Affect:  Appropriate  Thought Process:  Linear and Descriptions of Associations: Intact  Orientation:  Full (Time, Place, and Person)  Thought Content:  no hallucinations, no delusions, not internally preoccupied   Suicidal Thoughts:  No no suicidal or self injurious ideations, no homicidal or violent ideations, currently contracts for safety   Homicidal Thoughts:  No  Memory:  recent and remote grossly intact   Judgement:  Fair/improving   Insight:  Fair  Psychomotor Activity:  Normal- no tremors, no diaphoresis  Concentration:  Concentration: Good and Attention Span: Good  Recall:  Good  Fund of Knowledge:  Good  Language:  Good   Akathisia:  Negative  Handed:  Right  AIMS (if indicated):     Assets:  Communication Skills Desire for Improvement Resilience  ADL's:  Intact  Cognition:  WNL  Sleep:  Number of Hours: 6   Assessment -  [age] y old female, lives with BF. Presented on 10/25/19 after episode of self-cutting on forearm. She reported worsening depression and endorsed neuro-vegetative symptoms, but denied having suicidal ideations leading up to admission. She had been drinking daily, up to a bottle of whiskey per day, was buying Xanax /taking 1-2 mg daily and abusing amphetamines intermittently. Admission BAL was negative and she reported last drank 1-2 days prior to admission. Two days ago, patient had episode of hypotension with associated pallor & diaphoresis. At the time was sent to ED, where she was given IV hydration.  Today patient is presenting with improved mood, states she is feeling better. She denies any depression/anxiety symptoms. She says she is doing well on her medications. Denies any side effects. Currently she is not endorsing symptoms of Alcohol/BZD WDL and she is not presenting tremulous or diaphoretic at this time. She is resting in bed. She has be discharged to her home as she is feeling better.   Treatment Plan Summary: Daily contact with patient to assess and evaluate symptoms and progress in treatment and Medication management.  - Continue inpatient hospitalization. - Will continue today 10/29/2019 plan as below except where it is noted.  Treatment team working on disposition planning options Encourage group & milieu participation Encourage efforts to work on sobriety and relapse prevention  Continue Ativan WDL protocol - Ativan taper  for alcohol WDL  Continue Thiamine and MVI supplement  Continue Zoloft 25 mgrs QDAY for depression Vital signs are stable: BP: 118/83, Pulse: 92 . Encourage PO fluids.  Armandina StammerAgnes Angelene Rome, NP, PMHNP, FNP-BC 10/29/2019, 1:05 PMPatient ID: Patty Mata, female    DOB: 11/07/1990, 29 y.o.   MRN: [id number]

## 2019-10-30 DIAGNOSIS — F1520 Other stimulant dependence, uncomplicated: Secondary | ICD-10-CM

## 2019-10-30 DIAGNOSIS — F1320 Sedative, hypnotic or anxiolytic dependence, uncomplicated: Secondary | ICD-10-CM

## 2019-10-30 MED ORDER — FOLIC ACID 1 MG PO TABS
1.0000 mg | ORAL_TABLET | Freq: Every day | ORAL | Status: DC
  Administered 2019-10-30 – 2019-10-31 (×2): 1 mg via ORAL
  Filled 2019-10-30 (×4): qty 1

## 2019-10-30 MED ORDER — TRAZODONE HCL 50 MG PO TABS
50.0000 mg | ORAL_TABLET | Freq: Every evening | ORAL | Status: DC | PRN
  Administered 2019-10-30: 50 mg via ORAL
  Filled 2019-10-30: qty 1

## 2019-10-30 NOTE — Progress Notes (Signed)
   10/29/19 2040  COVID-19 Daily Checkoff  Have you had a fever (temp > 37.80C/100F)  in the past 24 hours?  No  COVID-19 EXPOSURE  Have you traveled outside the state in the past 14 days? No  Have you been in contact with someone with a confirmed diagnosis of COVID-19 or PUI in the past 14 days without wearing appropriate PPE? No  Have you been living in the same home as a person with confirmed diagnosis of COVID-19 or a PUI (household contact)? No  Have you been diagnosed with COVID-19? No

## 2019-10-30 NOTE — Progress Notes (Signed)
Pt did not attend wrap up AA group.

## 2019-10-30 NOTE — Progress Notes (Signed)
Patient presents with a flat affect and depressed mood. She denies SI/HI. She denies depression and anxiety today. She reports fair sleep. She denies alcohol withdrawal symptoms, none observed. She has minimal interaction on the unit, does not attend groups and appears withdrawn. She verbalizes readiness to discharge home and is requesting to discharge today.   Orders reviewed with the pt. V/s reviewed.  Verbal support provided. Pt encouraged to attend groups. 15 minute checks performed for safety.   Pt compliant with treatment plan and denies having any side effects to the medications.

## 2019-10-30 NOTE — H&P (Signed)
Psychiatric Admission Assessment Adult  Patient Identification: Patty LewandowskyKeely M Mata MRN:  [id number] Date of Evaluation:  10/30/2019 Chief Complaint:  MDD Bipolar  Principal Diagnosis: <principal problem not specified> Diagnosis:  Active Problems:   * No active hospital problems. *  History of Present Illness: Patient is seen and examined.  This is a history and physical being dictated on 10/30/2019.  We were unable to find an H&P on admission, and as well as suicide risk assessment had apparently not been done.  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  She stated at that time that she had told people that she left them, and that she no longer needed to be around anymore.  In the emergency department she denied suicidal ideation or intent.  She stated she was cutting her arm because she was depressed.  She stated that she had not cut herself before.  She stated that she takes Xanax illegally, but also obtains Xanax off the street when necessary.  She stated she knew that she would no longer be able to get Xanax and that is why she got it off the street.  The PDMP database revealed the last legal prescription for benzodiazepines that she had received was in 05/31/2018.  She received 25 mg diazepam tablets at that time.  She stated on examination today that she received Xanax from Dr. Curly [place]oresHedden [place].  Review of the electronic medical record revealed previous suicide attempts and accidental drug overdoses in the past.  She stated that this was her first psychiatric admission.  Review of her laboratories on admission revealed essentially normal electrolytes including liver function enzymes.  Her lipid panel was normal.  Her CBC was essentially normal.  Her drug screen was positive  for amphetamines as well as benzodiazepines.  She was admitted to the hospital for evaluation and stabilization.  Associated Signs/Symptoms: Depression Symptoms:  depressed mood, insomnia, fatigue, anxiety, loss of energy/fatigue, disturbed sleep, (Hypo) Manic Symptoms:  Impulsivity, Anxiety Symptoms:  Excessive Worry, Psychotic Symptoms:  Denied PTSD Symptoms: Negative Total Time spent with patient: 45 minutes  Past Psychiatric History: Patient denied any previous psychiatric admissions.  She is apparently been legally and illegally treated with benzodiazepines in the past.  She also had amphetamines in her drug screen.  She denied any previous psychiatric admissions.  Review of the electronic medical record revealed an emergency room visit in 2017 for generalized anxiety.  At that time she was also taking gabapentin 300 mg p.o. 3 times daily for chronic pain issues.  There was also a trazodone prescription here as needed for insomnia.  Is the patient at risk to self? No.  Has the patient been a risk to self in the past 6 months? No.  Has the patient been a risk to self within the distant past? No.  Is the patient a risk to others? No.  Has the patient been a risk to others in the past 6 months? No.  Has the patient been a risk to others within the distant past? No.   Prior Inpatient Therapy:   Prior Outpatient Therapy:    Alcohol Screening:   Substance Abuse History in the last 12 months:  Yes.   Consequences of Substance Abuse: Medical Consequences:  This hospitalization is basically for substance related issues. Previous Psychotropic Medications: Yes  Psychological Evaluations: No  Past Medical History:  Past Medical History:  Diagnosis Date  . Anxiety   . Depression   . History of kidney stones   . Kidney stones     Past Surgical History:  Procedure Laterality Date  . STENT PLACEMENT NON-VASCULAR ([facility] HX)     renal  . TUBAL LIGATION Bilateral 03/06/2018    Procedure: POST PARTUM TUBAL LIGATION;  Surgeon: Lesly Dukes, MD;  Location: [facility] BIR[facility]ING SU[facility];  Service: Gynecology;  Laterality: Bilateral;   Family History:  Family History  Problem Relation Age of Onset  . Depression Mother   . Arthritis Mother   . Asthma Father   . Mental illness Father   . Cancer Maternal Grandfather    Family Psychiatric  History: Denied Tobacco Screening:   Social History:  Social History   Substance and Sexual Activity  Alcohol Use Yes   Comment: one pint whiskey daily     Social History   Substance and Sexual Activity  Drug Use Not Currently    Additional Social History: Marital status: Long term relationship Long term relationship, how long?: "3 years" What types of issues is patient dealing with in the relationship?: "nothing big" Does patient have children?: Yes How many children?: 2 How is patient's relationship with their children?: "good"                         Allergies:   Allergies  Allergen Reactions  . Penicillins Rash    Has patient had a PCN reaction causing immediate rash, facial/tongue/throat swelling, SOB or lightheadedness with hypotension: Yes Has patient had a PCN reaction causing severe rash involving mucus membranes or skin necrosis: No Has patient had a PCN reaction that required hospitalization: No Has patient had a PCN reaction occurring within the last 10 years: No If all of the above answers are "NO", then may proceed with Cephalosporin use.    Lab Results: No results found for this or any previous visit (from the past 48 hour(s)).  Blood Alcohol level:  Lab Results  Component Value Date   ETH <10 10/25/2019   ETH 21 (H) 03/15/2019    Metabolic Disorder Labs:  Lab Results  Component Value Date   HGBA1C 5.2 10/27/2019   MPG 102.54 10/27/2019   No results found for: PROLACTIN Lab Results  Component Value Date   CHOL 119 10/27/2019   TRIG 62 10/27/2019   HDL 49 10/27/2019   CHOLHDL 2.4  10/27/2019   VLDL 12 10/27/2019   LDLCALC 58 10/27/2019    Current Medications: Current Facility-Administered Medications  Medication Dose Route Frequency Provider Last Rate Last Admin  . folic acid (FOLVITE) tablet 1 mg  1 mg Oral Daily Antonieta Pert, MD      . hydrOXYzine (ATARAX/VISTARIL) tablet 25 mg  25 mg Oral Q8H PRN Cobos, Rockey Situ, MD   25 mg at 10/29/19 1849  . ibuprofen (ADVIL) tablet 400 mg  400 mg Oral Q6H PRN Cobos, Rockey Situ, MD   400 mg at 10/29/19 1849  . LORazepam (ATIVAN) tablet 1 mg  1 mg Oral Q6H PRN Cobos, Rockey Situ, MD   1 mg at 10/27/19 2152  . multivitamin with minerals tablet 1 tablet  1 tablet Oral Daily Cobos, Rockey Situ, MD   1 tablet at 10/30/19 0805  . nicotine (NICODERM CQ - dosed in mg/24 hours) patch 21 mg  21 mg Transdermal Daily Cobos, Rockey Situ, MD  21 mg at 10/30/19 0808  . sertraline (ZOLOFT) tablet 25 mg  25 mg Oral Daily Cobos, Rockey Situ, MD   25 mg at 10/30/19 0805  . thiamine (B-1) injection 100 mg  100 mg Intramuscular Once Cobos, Fernando A, MD      . thiamine tablet 100 mg  100 mg Oral Daily Cobos, Rockey Situ, MD   100 mg at 10/30/19 0805   PTA Medications: Medications Prior to Admission  Medication Sig Dispense Refill Last Dose  . ALPRAZolam (XANAX) 1 MG tablet Take 1 mg by mouth 3 (three) times daily as needed for anxiety.     . docusate sodium (COLACE) 100 MG capsule Take 1 capsule (100 mg total) by mouth every 12 (twelve) hours. (Patient not taking: Reported on 10/25/2019) 60 capsule 0   . glycerin adult 2 g suppository Place 1 suppository rectally as needed for constipation. (Patient not taking: Reported on 10/25/2019) 12 suppository 0   . polyethylene glycol powder (GLYCOLAX/MIRALAX) 17 GM/SCOOP powder Take 17 g by mouth daily. (Patient not taking: Reported on 10/25/2019) 255 g 0     Musculoskeletal: Strength & Muscle Tone: within normal limits Gait & Station: normal Patient leans: N/A  Psychiatric Specialty Exam: Physical  Exam Vitals and nursing note reviewed.  HENT:     Head: Normocephalic and atraumatic.  Pulmonary:     Effort: Pulmonary effort is normal.  Neurological:     General: No focal deficit present.     Mental Status: She is alert.     Review of Systems  Blood pressure 110/80, pulse (!) 109, temperature 98.3 F (36.8 C), temperature source Oral, resp. rate 16, last menstrual period 10/25/2019, SpO2 100 %, unknown if currently breastfeeding.There is no height or weight on file to calculate BMI.  General Appearance: Disheveled  Eye Contact:  Fair  Speech:  Normal Rate  Volume:  Decreased  Mood:  Sedated  Affect:  Congruent  Thought Process:  Coherent and Descriptions of Associations: Circumstantial  Orientation:  Full (Time, Place, and Person)  Thought Content:  Logical  Suicidal Thoughts:  No  Homicidal Thoughts:  No  Memory:  Immediate;   Fair Recent;   Fair Remote;   Fair  Judgement:  Impaired  Insight:  Lacking  Psychomotor Activity:  Decreased  Concentration:  Concentration: Fair and Attention Span: Fair  Recall:  [employer] of Knowledge:  Fair  Language:  Fair  Akathisia:  Negative  Handed:  Right  AIMS (if indicated):     Assets:  Desire for Improvement Resilience  ADL's:  Intact  Cognition:  WNL  Sleep:  Number of Hours: 6.75    Treatment Plan Summary: Daily contact with patient to assess and evaluate symptoms and progress in treatment, Medication management and Plan : Patient is seen and examined.  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  It does appear that she was admitted on 10/28/2019, but an H&P and a suicide risk assessment had not been done at that time.  She was admitted to the hospital.  She was placed on a benzodiazepine taper.  I am going to stop the standing dosage secondary to oversedation.  We will continue lorazepam 1 mg p.o. every 6 hours as needed a  CIWA greater than 10.  We will also continue the sertraline at 25 mg p.o. daily, thiamine 100 mg p.o. daily, and I will add folic acid 1 mg p.o.  daily.  Her vital signs are stable, she is afebrile.  Her last CIWA at this a.m. was 1.  She slept essentially 7 hours last night.  She is requesting discharge currently, and we will collect collateral information and attempt to find out exactly what is going on.  Observation Level/Precautions:  15 minute checks  Laboratory:  Chemistry Profile  Psychotherapy:    Medications:    Consultations:    Discharge Concerns:    Estimated LOS:  Other:     Physician Treatment Plan for Primary Diagnosis: <principal problem not specified> Long Term Goal(s): Improvement in symptoms so as ready for discharge  Short Term Goals: Ability to identify changes in lifestyle to reduce recurrence of condition will improve, Ability to verbalize feelings will improve, Ability to disclose and discuss suicidal ideas, Ability to demonstrate self-control will improve, Ability to identify and develop effective coping behaviors will improve, Ability to maintain clinical measurements within normal limits will improve and Ability to identify triggers associated with substance abuse/mental health issues will improve  Physician Treatment Plan for Secondary Diagnosis: Active Problems:   * No active hospital problems. *  Long Term Goal(s): Improvement in symptoms so as ready for discharge  Short Term Goals: Ability to identify changes in lifestyle to reduce recurrence of condition will improve, Ability to verbalize feelings will improve, Ability to disclose and discuss suicidal ideas, Ability to demonstrate self-control will improve, Ability to identify and develop effective coping behaviors will improve, Ability to maintain clinical measurements within normal limits will improve and Ability to identify triggers associated with substance abuse/mental health issues will improve  I certify  that inpatient services furnished can reasonably be expected to improve the patient's condition.    Antonieta Pert, MD 7/31/202111:00 AM

## 2019-10-30 NOTE — BHH Group Notes (Signed)
Adult Psychoeducational Group Note  Date:  10/30/2019 Time:  12:20 PM  Group Topic/Focus:  Goals Group:   The focus of this group is to help patients establish daily goals to achieve during treatment and discuss how the patient can incorporate goal setting into their daily lives to aide in recovery.  Participation Level:  Did Not Attend   Dione Housekeeper 10/30/2019, 12:20 PM

## 2019-10-30 NOTE — Progress Notes (Signed)
   10/30/19 2313  COVID-19 Daily Checkoff  Have you had a fever (temp > 37.80C/100F)  in the past 24 hours?  No  If you have had runny nose, nasal congestion, sneezing in the past 24 hours, has it worsened? No  COVID-19 EXPOSURE  Have you traveled outside the state in the past 14 days? No  Have you been in contact with someone with a confirmed diagnosis of COVID-19 or PUI in the past 14 days without wearing appropriate PPE? No  Have you been living in the same home as a person with confirmed diagnosis of COVID-19 or a PUI (household contact)? No  Have you been diagnosed with COVID-19? No

## 2019-10-30 NOTE — BHH Suicide Risk Assessment (Signed)
[facility] Admission Suicide Risk Assessment   Nursing information obtained from:    Demographic factors:    Current Mental Status:    Loss Factors:    Historical Factors:    Risk Reduction Factors:     Total Time spent with patient: 30 minutes Principal Problem: <principal problem not specified> Diagnosis:  Active Problems:   * No active hospital problems. *  Subjective Data: Patient is seen and examined.  This suicide assessment is being dictated on 10/30/2019.  We were unable to find a suicide assessment on admission, and as well were unable to find an H&P and these will be completed today.  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  She stated at that time that she had told people that she left them, and that she no longer needed to be around anymore.  In the emergency department she denied suicidal ideation or intent.  She stated she was cutting her arm because she was depressed.  She stated that she had not cut herself before.  She stated that she takes Xanax illegally, but also obtains Xanax off the street when necessary.  She stated she knew that she would no longer be able to get Xanax and that is why she got it off the street.  The PDMP database revealed the last legal prescription for benzodiazepines that she had received was in 05/31/2018.  She received 25 mg diazepam tablets at that time.  She stated on examination today that she received Xanax from Dr. Curly Shores here in [place].  Review of the electronic medical record revealed previous suicide attempts and accidental drug overdoses in the past.  She stated that this was her first psychiatric admission.  Review of her laboratories on admission revealed essentially normal electrolytes including liver function enzymes.  Her lipid panel was normal.  Her  CBC was essentially normal.  Her drug screen was positive for amphetamines as well as benzodiazepines.  She was admitted to the hospital for evaluation and stabilization.  Continued Clinical Symptoms:    The "Alcohol Use Disorders Identification Test", Guidelines for Use in Primary Care, Second Edition.  World Science writer [employer]). Score between 0-7:  no or low risk or alcohol related problems. Score between 8-15:  moderate risk of alcohol related problems. Score between 16-19:  high risk of alcohol related problems. Score 20 or above:  warrants further diagnostic evaluation for alcohol dependence and treatment.   CLINICAL FACTORS:   Depression:   Anhedonia Impulsivity Insomnia Alcohol/Substance Abuse/Dependencies   Musculoskeletal: Strength & Muscle Tone: within normal limits Gait & Station: normal Patient leans: N/A  Psychiatric Specialty Exam: Physical Exam Vitals and nursing note reviewed.  HENT:     Head: Normocephalic and atraumatic.  Pulmonary:     Effort: Pulmonary effort is normal.  Neurological:     General: No focal deficit present.     Mental Status: She is alert and oriented to person, place, and time.     Review of Systems  Blood pressure 110/80, pulse (!) 109, temperature 98.3 F (36.8 C), temperature source Oral, resp. rate 16, last menstrual period 10/25/2019, SpO2 100 %, unknown if currently breastfeeding.There is no height or weight on file to calculate BMI.  General Appearance: Disheveled  Eye Contact:  Minimal  Speech:  Normal Rate  Volume:  Decreased  Mood:  Sedated  Affect:  Congruent  Thought Process:  Coherent and Descriptions of Associations: Circumstantial  Orientation:  Full (Time, Place, and Person)  Thought Content:  Logical  Suicidal Thoughts:  No  Homicidal Thoughts:  No  Memory:  Immediate;   Fair Recent;   Fair Remote;   Fair  Judgement:  Impaired  Insight:  Lacking  Psychomotor Activity:  Decreased  Concentration:   Concentration: Fair and Attention Span: Fair  Recall:  [employer] of Knowledge:  Fair  Language:  Fair  Akathisia:  Negative  Handed:  Right  AIMS (if indicated):     Assets:  Desire for Improvement Resilience  ADL's:  Intact  Cognition:  WNL  Sleep:  Number of Hours: 6.75      COGNITIVE FEATURES THAT CONTRIBUTE TO RISK:  None    SUICIDE RISK:   Minimal: No identifiable suicidal ideation.  Patients presenting with no risk factors but with morbid ruminations; may be classified as minimal risk based on the severity of the depressive symptoms  PLAN OF CARE: Patient is seen and examined.  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  It does appear that she was admitted on 10/28/2019, but an H&P and a suicide risk assessment had not been done at that time.  She was admitted to the hospital.  She was placed on a benzodiazepine taper.  I am going to stop the standing dosage secondary to oversedation.  We will continue lorazepam 1 mg p.o. every 6 hours as needed a CIWA greater than 10.  We will also continue the sertraline at 25 mg p.o. daily, thiamine 100 mg p.o. daily, and I will add folic acid 1 mg p.o. daily.  Her vital signs are stable, she is afebrile.  Her last CIWA at this a.m. was 1.  She slept essentially 7 hours last night.  She is requesting discharge currently, and we will collect collateral information and attempt to find out exactly what is going on.  I certify that inpatient services furnished can reasonably be expected to improve the patient's condition.   Antonieta Pert, MD 10/30/2019, 10:51 AM

## 2019-10-30 NOTE — Progress Notes (Signed)
   10/30/19 2315  Psych Admission Type (Psych Patients Only)  Admission Status Involuntary  Psychosocial Assessment  Patient Complaints Anxiety  Eye Contact None  Facial Expression Flat  Affect Appropriate to circumstance  Speech Logical/coherent  Interaction Assertive  Motor Activity Other (Comment) (WDL)  Appearance/Hygiene Improved  Behavior Characteristics Appropriate to situation  Mood Depressed;Pleasant  Thought Process  Coherency WDL  Content WDL  Delusions None reported or observed  Perception WDL  Hallucination None reported or observed  Judgment Poor  Confusion None  Danger to Self  Current suicidal ideation? Denies  Danger to Others  Danger to Others None reported or observed

## 2019-10-30 NOTE — Progress Notes (Signed)
°   10/29/19 2040  Psych Admission Type (Psych Patients Only)  Admission Status Involuntary  Psychosocial Assessment  Patient Complaints Anxiety;Depression;Sadness  Eye Contact Brief  Facial Expression Anxious;Sad  Affect Anxious;Depressed;Sad  Speech Soft;Logical/coherent  Interaction Minimal;Forwards little  Motor Activity Slow  Appearance/Hygiene Disheveled  Behavior Characteristics Cooperative;Anxious;Calm  Mood Depressed;Anxious;Sad;Pleasant  Thought Process  Coherency WDL  Content WDL  Delusions None reported or observed  Perception WDL  Hallucination None reported or observed  Judgment Poor  Confusion None  Danger to Self  Current suicidal ideation? Denies  Danger to Others  Danger to Others None reported or observed

## 2019-10-30 NOTE — Progress Notes (Signed)
Adult Psychoeducational Group Note  Date:  10/30/2019 Time:  10:20 PM  Group Topic/Focus:  Wrap-Up Group:   The focus of this group is to help patients review their daily goal of treatment and discuss progress on daily workbooks.  Participation Level:  Active  Participation Quality:  Appropriate  Affect:  Appropriate  Cognitive:  Appropriate  Insight: Appropriate  Engagement in Group:  Engaged  Modes of Intervention:  Discussion  Additional Comments: Patient attended group and participated.  Patty Mata 10/30/2019, 10:20 PM

## 2019-10-31 DIAGNOSIS — F1994 Other psychoactive substance use, unspecified with psychoactive substance-induced mood disorder: Secondary | ICD-10-CM | POA: Diagnosis present

## 2019-10-31 MED ORDER — MAGNESIUM HYDROXIDE 400 MG/5ML PO SUSP: 30.0000 mL | Freq: Every day | ORAL | Status: DC | PRN

## 2019-10-31 MED ORDER — ACETAMINOPHEN 325 MG PO TABS: 650.0000 mg | ORAL_TABLET | Freq: Four times a day (QID) | ORAL | Status: DC | PRN

## 2019-10-31 MED ORDER — FOLIC ACID 1 MG PO TABS: 1.0000 mg | ORAL_TABLET | Freq: Every day | ORAL | 0 refills | Status: DC

## 2019-10-31 MED ORDER — NICOTINE 21 MG/24HR TD PT24: 21.0000 mg | MEDICATED_PATCH | Freq: Every day | TRANSDERMAL | 0 refills | Status: AC

## 2019-10-31 MED ORDER — SERTRALINE HCL 25 MG PO TABS: 25.0000 mg | ORAL_TABLET | Freq: Every day | ORAL | 0 refills | Status: DC

## 2019-10-31 MED ORDER — THIAMINE HCL 100 MG PO TABS: 100.0000 mg | ORAL_TABLET | Freq: Every day | ORAL | 0 refills | Status: DC

## 2019-10-31 MED ORDER — ALUM & MAG HYDROXIDE-SIMETH 200-200-20 MG/5ML PO SUSP: 30.0000 mL | ORAL | Status: DC | PRN

## 2019-10-31 MED ORDER — ADULT MULTIVITAMIN W/MINERALS CH: 1.0000 | ORAL_TABLET | Freq: Every day | ORAL | 0 refills | Status: DC

## 2019-10-31 MED ORDER — HYDROXYZINE HCL 25 MG PO TABS: 25.0000 mg | ORAL_TABLET | Freq: Three times a day (TID) | ORAL | 0 refills | Status: AC | PRN

## 2019-10-31 NOTE — Progress Notes (Signed)
Pt denies SI/HI. Pt received both written and verbal discharge instructions. Pt verbalizes understanding of discharge instructions. Pt agreed to f/u appt and med regimen. Pt received an AVS, SRA, transitional record, and prescriptions. Pt gathered belongings from room and locker. Pt safety discharged to the lobby and picked up by her boyfriend.

## 2019-10-31 NOTE — BHH Counselor (Signed)
Clinical Social Work Note  Nurse Practitioner Dahlia Byes asked for information about patient's discharge medication management appointment, which was determined to be scheduled for 9/1 at the [facility].  NP stated the patient was mistakenly given only a 7-day prescription for her medication, will need more.  She attempted in CSW's presence to call the patient to find out which pharmacy is used so she can call it in.  She had to leave a voicemail, gave the nursing station as the point of contact to call back.  Ambrose Mantle, LCSW 10/31/2019, 4:49 PM

## 2019-10-31 NOTE — Discharge Summary (Signed)
Physician Discharge Summary Note  Patient:  Patty Mata is an 29 y.o., female MRN:  [id number] DOB:  1990/08/26 Patient phone:  [phone] (home)  Patient address:   [address] [place] [address],  Total Time spent with patient: 30 minutes  Date of Admission:  10/27/2019 Date of Discharge: 10/31/2019   Reason for Admission:  Suicide attempt by cutting wrist.  Principal Problem: <principal problem not specified> Discharge Diagnoses: Active Problems:   Substance induced mood disorder [facility])   Past Psychiatric History: Per ER record Anxiety disorder  Past Medical History:  Past Medical History:  Diagnosis Date  . Anxiety   . Depression   . History of kidney stones   . Kidney stones     Past Surgical History:  Procedure Laterality Date  . STENT PLACEMENT NON-VASCULAR ([place] HX)     renal  . TUBAL LIGATION Bilateral 03/06/2018   Procedure: POST PARTUM TUBAL LIGATION;  Surgeon: Guss Bunde, MD;  Location: [facility];  Service: Gynecology;  Laterality: Bilateral;   Family History:  Family History  Problem Relation Age of Onset  . Depression Mother   . Arthritis Mother   . Asthma Father   . Mental illness Father   . Cancer Maternal Grandfather    Family Psychiatric  History: Denies Social History:  Social History   Substance and Sexual Activity  Alcohol Use Yes   Comment: one pint whiskey daily     Social History   Substance and Sexual Activity  Drug Use Not Currently    Social History   Socioeconomic History  . Marital status: Single    Spouse name: Not on file  . Number of children: 2  . Years of education: 27  . Highest education level: Not on file  Occupational History  . Not on file  Tobacco Use  . Smoking status: Current Every Day Smoker    Packs/day: 1.00    Years: 5.00    Pack years: 5.00    Types: Cigarettes  . Smokeless tobacco: Never Used  Vaping Use  . Vaping Use: Never used  Substance and Sexual Activity  .  Alcohol use: Yes    Comment: one pint whiskey daily  . Drug use: Not Currently  . Sexual activity: Yes    Birth control/protection: None  Other Topics Concern  . Not on file  Social History Narrative  . Not on file   Social Determinants of Health   Financial Resource Strain:   . Difficulty of Paying Living Expenses:   Food Insecurity:   . Worried About Charity fundraiser in the Last Year:   . Arboriculturist in the Last Year:   Transportation Needs:   . Film/video editor (Medical):   Marland Kitchen Lack of Transportation (Non-Medical):   Physical Activity:   . Days of Exercise per Week:   . Minutes of Exercise per Session:   Stress:   . Feeling of Stress :   Social Connections:   . Frequency of Communication with Friends and Family:   . Frequency of Social Gatherings with Friends and Family:   . Attends Religious Services:   . Active Member of Clubs or Organizations:   . Attends Archivist Meetings:   Marland Kitchen Marital Status:     Hospital Course:  MS Patty Mata is a female admitted on the 26th of July for suicide attempt by making superficial cuts to her arm.  At the time for this act she had informed  friends that she did not need to be around anymore.  She however denies suicide ideation or intent and denies so to this Probation officer yesterday.  She states she cut her left arm due to depression.  This is her first time of cutting herself.  She also has hx of Benzodiazepine abuse and her urine tested positive for Benzo and Met and admitted drinking whisky a bottle daily.  Her BAL was negative on arrival to the unit.  She was placed on CIWA  detox protocol, we started her on low dose Sertraline for anxiety and depression.  We offered Nicotine patch daily for Nicotine craving.  Patient was encouraged to attend and participate in group activities.  She mostly stayed in bed and minimally participated in group.  She was sent to the ER for episode of hypotension and after evaluation came back and  completed her treatment.  She is rounding on daily by providers and close monitoring was maintained per protocol.   We reviewed her care daily with members of our interdisciplinary team., She ate little according to her due to poor appetite but sleep most of the time day and night.  She continues to endorse less depressive episode and denies feeling suicidal.  She interacts with her peers and comes out for her medications.  She is determined to be safe and ready for discharge and she will continue treatment in the outpatient settings.  She is discharged home.  Physical Findings: AIMS: Facial and Oral Movements Muscles of Facial Expression: None, normal Lips and Perioral Area: None, normal Jaw: None, normal Tongue: None, normal,Extremity Movements Upper (arms, wrists, hands, fingers): None, normal Lower (legs, knees, ankles, toes): None, normal, Trunk Movements Neck, shoulders, hips: None, normal, Overall Severity Severity of abnormal movements (highest score from questions above): None, normal Incapacitation due to abnormal movements: None, normal Patient's awareness of abnormal movements (rate only patient's report): No Awareness, Dental Status Current problems with teeth and/or dentures?: No Does patient usually wear dentures?: No  CIWA:  CIWA-Ar Total: 4 COWS:  COWS Total Score: 3  Musculoskeletal: Strength & Muscle Tone: within normal limits Gait & Station: normal Patient leans: N/A  Psychiatric Specialty Exam: Physical Exam  Review of Systems  Constitutional: Negative.   HENT: Negative.   Eyes: Negative.   Respiratory: Negative.   Cardiovascular: Negative.   Gastrointestinal: Negative.   Endocrine: Negative.   Genitourinary: Negative.   Musculoskeletal: Negative.   Skin: Negative.   Allergic/Immunologic: Negative.   Neurological: Negative.   Hematological: Negative.   Psychiatric/Behavioral: Negative.     Blood pressure 96/71, pulse 86, temperature 97.6 F (36.4 C),  temperature source Oral, resp. rate 16, last menstrual period 10/25/2019, SpO2 99 %, unknown if currently breastfeeding.There is no height or weight on file to calculate BMI.  General Appearance: Casual and Fairly Groomed  Eye Contact:  Good  Speech:  Clear and Coherent and Normal Rate  Volume:  Normal  Mood:  less depressed and anxious  Affect:  Congruent  Thought Process:  Coherent and Goal Directed  Orientation:  Full (Time, Place, and Person)  Thought Content:  Logical  Suicidal Thoughts:  No  Homicidal Thoughts:  No  Memory:  Immediate;   Good Recent;   Good Remote;   Good  Judgement:  Good  Insight:  Good  Psychomotor Activity:  Decreased  Concentration:  Concentration: Fair and Attention Span: Fair  Recall:  [employer] of Knowledge:  Fair  Language:  Fair  Akathisia:  No  Handed:  Right  AIMS (if indicated):     Assets:  Desire for Improvement Resilience  ADL's:  Intact  Cognition:  WNL  Sleep:  Number of Hours: 6.75        Has this patient used any form of tobacco in the last 30 days? (Cigarettes, Smokeless Tobacco, Cigars, and/or Pipes) Yes, Yes, A prescription for an FDA-approved tobacco cessation medication was offered at discharge and the patient refused  Blood Alcohol level:  Lab Results  Component Value Date   ETH <10 10/25/2019   ETH 21 (H) 63/04/6008    Metabolic Disorder Labs:  Lab Results  Component Value Date   HGBA1C 5.2 10/27/2019   MPG 102.54 10/27/2019   No results found for: PROLACTIN Lab Results  Component Value Date   CHOL 119 10/27/2019   TRIG 62 10/27/2019   HDL 49 10/27/2019   CHOLHDL 2.4 10/27/2019   VLDL 12 10/27/2019   LDLCALC 58 10/27/2019    See Psychiatric Specialty Exam and Suicide Risk Assessment completed by Attending Physician prior to discharge.  Discharge destination:  Home  Is patient on multiple antipsychotic therapies at discharge:  No   Has Patient had three or more failed trials of antipsychotic monotherapy  by history:  No  Recommended Plan for Multiple Antipsychotic Therapies: NA  Discharge Instructions    [facility] to [facility] adult   Complete by: As directed    Diet - low sodium heart healthy   Complete by: As directed      Allergies as of 10/31/2019      Reactions   Penicillins Rash   Has patient had a PCN reaction causing immediate rash, facial/tongue/throat swelling, SOB or lightheadedness with hypotension: Yes Has patient had a PCN reaction causing severe rash involving mucus membranes or skin necrosis: No Has patient had a PCN reaction that required hospitalization: No Has patient had a PCN reaction occurring within the last 10 years: No If all of the above answers are "NO", then may proceed with Cephalosporin use.      Medication List    STOP taking these medications   ALPRAZolam 1 MG tablet Commonly known as: XANAX   docusate sodium 100 MG capsule Commonly known as: COLACE   glycerin adult 2 g suppository   polyethylene glycol powder 17 GM/SCOOP powder Commonly known as: GLYCOLAX/MIRALAX     TAKE these medications     Indication  folic acid 1 MG tablet Commonly known as: FOLVITE Take 1 tablet (1 mg total) by mouth daily. Start taking on: November 01, 2019  Indication: Anemia From Inadequate Folic Acid   hydrOXYzine 25 MG tablet Commonly known as: ATARAX/VISTARIL Take 1 tablet (25 mg total) by mouth every 8 (eight) hours as needed for up to 7 days for anxiety.  Indication: Feeling Anxious   multivitamin with minerals Tabs tablet Take 1 tablet by mouth daily. Start taking on: November 01, 2019  Indication: Nutritional Support, Nutritional supplement   nicotine 21 mg/24hr patch Commonly known as: NICODERM CQ - dosed in mg/24 hours Place 1 patch (21 mg total) onto the skin daily for 7 days. Start taking on: November 01, 2019  Indication: Nicotine Addiction   sertraline 25 MG tablet Commonly known as: ZOLOFT Take 1 tablet (25 mg total) by mouth daily for 7 days. Start  taking on: November 01, 2019  Indication: Generalized Anxiety Disorder, Major Depressive Disorder   thiamine 100 MG tablet Take 1 tablet (100 mg total) by mouth daily. Start taking on: November 01, 2019  Indication: Deficiency of Vitamin B1       Follow-up Information    [facility] Follow up on 11/12/2019.   Specialty: Behavioral Health Why: You have an appointment for therapy on 11/12/19 at 8:00 am. You also have an appointment for medication management on 12/01/19 at 11:20 am.  These appointments will be held Virtually. Contact information: [place] [phone]              Follow-up recommendations:  Activity:  as tolerated Diet:  regulra  Comments:  Follow up with assigned outpatient counselor and [facility] provider.  Avoid using Benzos not prescribed for you.  Seek outpatient substance abuse care.  Signed: Delfin Gant, NP 10/31/2019, 10:10 AM

## 2019-10-31 NOTE — BHH Suicide Risk Assessment (Signed)
[facility] Discharge Suicide Risk Assessment   Principal Problem: <principal problem not specified> Discharge Diagnoses: Active Problems:   * No active hospital problems. *   Total Time spent with patient: 15 minutes  Musculoskeletal: Strength & Muscle Tone: within normal limits Gait & Station: normal Patient leans: N/A  Psychiatric Specialty Exam: Review of Systems  All other systems reviewed and are negative.   Blood pressure (!) 125/89, pulse (!) 114, temperature 98.2 F (36.8 C), temperature source Oral, resp. rate 16, last menstrual period 10/25/2019, SpO2 99 %, unknown if currently breastfeeding.There is no height or weight on file to calculate BMI.  General Appearance: Casual  Eye Contact::  Fair  Speech:  Normal Rate409  Volume:  Decreased  Mood:  Euthymic  Affect:  Congruent  Thought Process:  Coherent and Descriptions of Associations: Intact  Orientation:  Full (Time, Place, and Person)  Thought Content:  Logical  Suicidal Thoughts:  No  Homicidal Thoughts:  No  Memory:  Immediate;   Fair Recent;   Fair Remote;   Fair  Judgement:  Intact  Insight:  Fair  Psychomotor Activity:  Decreased  Concentration:  Good  Recall:  Good  Fund of Knowledge:Good  Language: Good  Akathisia:  Negative  Handed:  Right  AIMS (if indicated):     Assets:  Desire for Improvement Resilience  Sleep:  Number of Hours: 6.75  Cognition: WNL  ADL's:  Intact   Mental Status Per Nursing Assessment::   On Admission:     Demographic Factors:  Caucasian, Low socioeconomic status and Unemployed  Loss Factors: NA  Historical Factors: Impulsivity  Risk Reduction Factors:   Sense of responsibility to family, Living with another person, especially a relative and Positive social support  Continued Clinical Symptoms:  Depression:   Comorbid alcohol abuse/dependence Impulsivity Alcohol/Substance Abuse/Dependencies  Cognitive Features That Contribute To Risk:  None    Suicide Risk:   Minimal: No identifiable suicidal ideation.  Patients presenting with no risk factors but with morbid ruminations; may be classified as minimal risk based on the severity of the depressive symptoms   Follow-up Information    [facility] Follow up on 11/12/2019.   Specialty: Behavioral Health Why: You have an appointment for therapy on 11/12/19 at 8:00 am. You also have an appointment for medication management on 12/01/19 at 11:20 am.  These appointments will be held Virtually. Contact information: [address] [place] [address] [phone]              Plan Of Care/Follow-up recommendations:  Activity:  ad lib  Antonieta Pert, MD 10/31/2019, 8:17 AM

## 2019-10-31 NOTE — Progress Notes (Signed)
  [facility] Adult Case Management Discharge Plan :  Will you be returning to the same living situation after discharge:  Yes,  home. At discharge, do you have transportation home?: Yes,  friend picking up. Do you have the ability to pay for your medications: Yes,  has [facility].  Release of information consent forms completed and in the chart;  Patient's signature needed at discharge.  Patient to Follow up at:  Follow-up Information    [facility] Follow up on 11/12/2019.   Specialty: Behavioral Health Why: You have an appointment for therapy on 11/12/19 at 8:00 am. You also have an appointment for medication management on 12/01/19 at 11:20 am.  These appointments will be held Virtually. Contact information: [address] [place] [address] [phone]              Next level of care provider has access to [facility] Link:yes  Safety Planning and Suicide Prevention discussed: Yes,  with patient. Patient declined collateral consents.  Has patient been referred to the Quitline?: N/A patient is not a smoker  Patient has been referred for addiction treatment: Yes  Darreld Mclean, LCSWA 10/31/2019, 8:25 AM

## 2019-10-31 NOTE — BHH Group Notes (Signed)
Adult Psychoeducational Group Note  Date:  10/31/2019 Time:  12:07 PM  Group Topic/Focus:   PROGRESSIVE RELAXATION. A group where deep breathing is taught and tensing and relaxation muscle groups is used. Imagery is used as well.  Pts are asked to imagine 3 pillars that hold them up when they are not able to hold themselves up.    Participation Level:  Did Not Attend  Dione Housekeeper 10/31/2019, 12:07 PM

## 2019-11-12 ENCOUNTER — Other Ambulatory Visit: Payer: Self-pay

## 2019-11-12 ENCOUNTER — Ambulatory Visit (HOSPITAL_COMMUNITY): Payer: Medicaid Other | Admitting: Licensed Clinical Social Worker

## 2019-11-21 ENCOUNTER — Other Ambulatory Visit: Payer: Self-pay

## 2019-11-21 ENCOUNTER — Ambulatory Visit (INDEPENDENT_AMBULATORY_CARE_PROVIDER_SITE_OTHER): Payer: Medicaid Other

## 2019-11-21 ENCOUNTER — Encounter (HOSPITAL_COMMUNITY): Payer: Self-pay | Admitting: *Deleted

## 2019-11-21 ENCOUNTER — Ambulatory Visit (HOSPITAL_COMMUNITY)
Admission: EM | Admit: 2019-11-21 | Discharge: 2019-11-21 | Disposition: A | Discharge status: Home / Self Care | Payer: Medicaid Other | Attending: Emergency Medicine | Admitting: Emergency Medicine

## 2019-11-21 DIAGNOSIS — M25522 Pain in left elbow: Secondary | ICD-10-CM

## 2019-11-21 DIAGNOSIS — Z23 Encounter for immunization: Secondary | ICD-10-CM | POA: Diagnosis not present

## 2019-11-21 DIAGNOSIS — S50312A Abrasion of left elbow, initial encounter: Secondary | ICD-10-CM

## 2019-11-21 DIAGNOSIS — W19XXXA Unspecified fall, initial encounter: Secondary | ICD-10-CM

## 2019-11-21 DIAGNOSIS — R2232 Localized swelling, mass and lump, left upper limb: Secondary | ICD-10-CM

## 2019-11-21 DIAGNOSIS — S59902A Unspecified injury of left elbow, initial encounter: Secondary | ICD-10-CM | POA: Diagnosis not present

## 2019-11-21 MED ORDER — SULFAMETHOXAZOLE-TRIMETHOPRIM 800-160 MG PO TABS: 1.0000 | ORAL_TABLET | Freq: Two times a day (BID) | ORAL | 0 refills | Status: AC

## 2019-11-21 MED ORDER — BACITRACIN ZINC 500 UNIT/GM EX OINT
TOPICAL_OINTMENT | CUTANEOUS | Status: AC
  Filled 2019-11-21: qty 1.8

## 2019-11-21 MED ORDER — TETANUS-DIPHTH-ACELL PERTUSSIS 5-2.5-18.5 LF-MCG/0.5 IM SUSP
0.5000 mL | Freq: Once | INTRAMUSCULAR | Status: AC
  Administered 2019-11-21: 0.5 mL via INTRAMUSCULAR

## 2019-11-21 MED ORDER — HYDROCODONE-ACETAMINOPHEN 5-325 MG PO TABS
ORAL_TABLET | ORAL | Status: AC
  Filled 2019-11-21: qty 1

## 2019-11-21 MED ORDER — HYDROCODONE-ACETAMINOPHEN 5-325 MG PO TABS
1.0000 | ORAL_TABLET | Freq: Once | ORAL | Status: AC
  Administered 2019-11-21: 1 via ORAL

## 2019-11-21 MED ORDER — TETANUS-DIPHTH-ACELL PERTUSSIS 5-2.5-18.5 LF-MCG/0.5 IM SUSP
INTRAMUSCULAR | Status: AC
  Filled 2019-11-21: qty 0.5

## 2019-11-21 MED ORDER — BACITRACIN ZINC 500 UNIT/GM EX OINT
TOPICAL_OINTMENT | CUTANEOUS | Status: AC
  Filled 2019-11-21: qty 0.9

## 2019-11-21 MED ORDER — NAPROXEN 500 MG PO TABS
500.0000 mg | ORAL_TABLET | Freq: Two times a day (BID) | ORAL | 0 refills | Status: DC
Start: 2019-11-21 — End: 2019-12-14

## 2019-11-21 NOTE — ED Triage Notes (Signed)
Pt reports slipping on gravel 2 days ago, falling against ground.  Denies any LOC.  C/O significant left elbow and forearm pain.  Swelling noted to left elbow and proximal forearm; abrasion to left posterior elbow.  Also c/o generalized neck and back soreness.  LUE CMS intact.

## 2019-11-21 NOTE — ED Provider Notes (Signed)
[facility]    CSN: [id number] Arrival date & time: 11/21/19  1643      History   Chief Complaint Chief Complaint  Patient presents with  . Fall    HPI Patty Mata is a 29 y.o. female.   Patty Mata presents with complaints of left elbow pain after a fall two nights ago. She slipped on gravel and fell back onto back and left elbow. Resulting abrasions to the left elbow,  which she has since cleansed. Pain with any movement of left elbow. No numbness or tingling. Took goodies which didn't help with pain. She is right handed. Unknown last tdap.    ROS per HPI, negative if not otherwise mentioned.      Past Medical History:  Diagnosis Date  . Anxiety   . Depression   . History of kidney stones   . Kidney stones     Patient Active Problem List   Diagnosis Date Noted  . Substance induced mood disorder (HCC) 10/31/2019  . Severe recurrent major depression without psychotic features (HCC) 10/26/2019  . Alcohol-induced mood disorder (HCC)   . Cholestasis 03/03/2018  . Elevated liver function tests 02/19/2018  . No leakage of amniotic fluid into vagina 01/07/2018  . Rubella non-immune status, antepartum 11/04/2017  . Frequent UTI 10/30/2017  . Anxiety and depression 10/30/2017    Past Surgical History:  Procedure Laterality Date  . STENT PLACEMENT NON-VASCULAR ([facility] HX)     renal  . TUBAL LIGATION Bilateral 03/06/2018   Procedure: POST PARTUM TUBAL LIGATION;  Surgeon: Lesly Dukes, MD;  Location: [facility];  Service: Gynecology;  Laterality: Bilateral;    OB History    Gravida  2   Para  2   Term  2   Preterm      AB      Living  2     SAB      TAB      Ectopic      Multiple  0   Live Births  2            Home Medications    Prior to Admission medications   Medication Sig Start Date End Date Taking? Authorizing Provider  folic acid (FOLVITE) 1 MG tablet Take 1 tablet (1 mg total) by mouth daily. 11/01/19    Antonieta Pert, MD  Multiple Vitamin (MULTIVITAMIN WITH MINERALS) TABS tablet Take 1 tablet by mouth daily. 11/01/19   Antonieta Pert, MD  naproxen (NAPROSYN) 500 MG tablet Take 1 tablet (500 mg total) by mouth 2 (two) times daily. 11/21/19   Georgetta Haber, NP  sertraline (ZOLOFT) 25 MG tablet Take 1 tablet (25 mg total) by mouth daily for 7 days. 11/01/19 11/08/19  Antonieta Pert, MD  sulfamethoxazole-trimethoprim (BACTRIM DS) 800-160 MG tablet Take 1 tablet by mouth 2 (two) times daily for 7 days. 11/21/19 11/28/19  Georgetta Haber, NP  thiamine 100 MG tablet Take 1 tablet (100 mg total) by mouth daily. 11/01/19   Antonieta Pert, MD  amLODipine (NORVASC) 5 MG tablet Take 1 tablet (5 mg total) by mouth daily. Patient not taking: Reported on 12/05/2018 03/07/18 07/14/19  Tilda Burrow, MD    Family History Family History  Problem Relation Age of Onset  . Depression Mother   . Arthritis Mother   . Asthma Father   . Mental illness Father   . Cancer Maternal Grandfather     Social History Social  History   Tobacco Use  . Smoking status: Current Every Day Smoker    Packs/day: 1.00    Years: 5.00    Pack years: 5.00    Types: Cigarettes  . Smokeless tobacco: Never Used  Vaping Use  . Vaping Use: Never used  Substance Use Topics  . Alcohol use: Not Currently  . Drug use: Not Currently     Allergies   Penicillins   Review of Systems Review of Systems   Physical Exam Triage Vital Signs ED Triage Vitals  Enc Vitals Group     BP 11/21/19 1708 125/88     Pulse Rate 11/21/19 1708 (!) 103     Resp 11/21/19 1708 16     Temp 11/21/19 1708 98.2 F (36.8 C)     Temp Source 11/21/19 1708 Oral     SpO2 11/21/19 1708 100 %     Weight --      Height --      Head Circumference --      Peak Flow --      Pain Score 11/21/19 1707 9     Pain Loc --      Pain Edu? --      Excl. in GC? --    No data found.  Updated Vital Signs BP 125/88   Pulse (!) 103   Temp 98.2 F  (36.8 C) (Oral)   Resp 16   LMP 11/18/2019 (Exact Date)   SpO2 100%   Breastfeeding No   Visual Acuity Right Eye Distance:   Left Eye Distance:   Bilateral Distance:    Right Eye Near:   Left Eye Near:    Bilateral Near:     Physical Exam Constitutional:      General: She is not in acute distress.    Appearance: She is well-developed.  Cardiovascular:     Rate and Rhythm: Normal rate.  Pulmonary:     Effort: Pulmonary effort is normal.  Musculoskeletal:     Left elbow: Swelling present. No effusion or lacerations. Decreased range of motion. Tenderness present in olecranon process.     Comments: Generalized tenderness to left elbow, primarily around olecranon process with generalized swelling; abrasions to elbow without drainage; pain with ROM of elbow with mild limitation due to pain; hand sensation intact; bruising noted to right arm and to legs as well   Skin:    General: Skin is warm and dry.  Neurological:     Mental Status: She is alert and oriented to person, place, and time.      UC Treatments / Results  Labs (all labs ordered are listed, but only abnormal results are displayed) Labs Reviewed - No data to display  EKG   Radiology DG Elbow Complete Left  Result Date: 11/21/2019 CLINICAL DATA:  Slipped and fell 2 days ago.  Pain and swelling. EXAM: LEFT ELBOW - COMPLETE 3+ VIEW COMPARISON:  Forearm radiographs 03/19/2014 FINDINGS: There is no evidence of fracture, dislocation, or joint effusion. There is no evidence of arthropathy or other focal bone abnormality. There may be mild soft tissue swelling. IMPRESSION: Normal radiographs. Electronically Signed   By: Paulina Fusi M.D.   On: 11/21/2019 18:03    Procedures Procedures (including critical care time)  Medications Ordered in UC Medications  HYDROcodone-acetaminophen (NORCO/VICODIN) 5-325 MG per tablet 1 tablet (1 tablet Oral Given 11/21/19 1740)  Tdap (BOOSTRIX) injection 0.5 mL (0.5 mLs Intramuscular  Given 11/21/19 1740)    Initial Impression / Assessment and  Plan / UC Course  I have reviewed the triage vital signs and the nursing notes.  Pertinent labs & imaging results that were available during my care of the patient were reviewed by me and considered in my medical decision making (see chart for details).     Xray without acute findings. Contusion, abrasions to left elbow. Wound cleansed and dressed. tdap updated. Return precautions provided. Patient verbalized understanding and agreeable to plan. Ambulatory out of clinic without difficulty.     Final Clinical Impressions(s) / UC Diagnoses   Final diagnoses:  Injury of left elbow, initial encounter  Abrasion of left elbow, initial encounter  Fall, initial encounter     Discharge Instructions     Your xray is normal which is reassuring.  Activity as tolerated.  Ice and elevation to help with pain. Naproxen twice a day, take with food.  With the pain and redness to your wounds I have started antibiotics as well. Complete course of antibiotics.  Cleanse wound daily with soap and water. Keep covered to keep clean.  Follow up with sports medicine if your elbow continues to have pain with movement.  Return to be seen for any increased redness, warmth or drainage from the wound.     ED Prescriptions    Medication Sig Dispense Auth. Provider   naproxen (NAPROSYN) 500 MG tablet Take 1 tablet (500 mg total) by mouth 2 (two) times daily. 30 tablet Linus Mako B, NP   sulfamethoxazole-trimethoprim (BACTRIM DS) 800-160 MG tablet Take 1 tablet by mouth 2 (two) times daily for 7 days. 14 tablet Georgetta Haber, NP     PDMP not reviewed this encounter.   Georgetta Haber, NP 11/22/19 [phone]

## 2019-11-21 NOTE — ED Notes (Signed)
Ice pack placed left elbow and forearm.

## 2019-11-21 NOTE — Discharge Instructions (Addendum)
Your xray is normal which is reassuring.  Activity as tolerated.  Ice and elevation to help with pain. Naproxen twice a day, take with food.  With the pain and redness to your wounds I have started antibiotics as well. Complete course of antibiotics.  Cleanse wound daily with soap and water. Keep covered to keep clean.  Follow up with sports medicine if your elbow continues to have pain with movement.  Return to be seen for any increased redness, warmth or drainage from the wound.
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
## 2019-12-01 ENCOUNTER — Other Ambulatory Visit: Payer: Self-pay

## 2019-12-01 ENCOUNTER — Telehealth (HOSPITAL_COMMUNITY): Payer: Medicaid Other | Admitting: Psychiatry

## 2019-12-01 ENCOUNTER — Telehealth (HOSPITAL_COMMUNITY): Payer: Self-pay | Admitting: Psychiatry

## 2019-12-01 NOTE — Telephone Encounter (Signed)
A link for virtual visit was sent to the number provided on the patient's file in the EMR.  There was no response and when the writer called the number it turned out it belongs to a Archivist in [employer]. Patient was not seen today.

## 2019-12-14 ENCOUNTER — Ambulatory Visit
Admission: EM | Admit: 2019-12-14 | Discharge: 2019-12-14 | Disposition: A | Discharge status: Home / Self Care | Payer: Medicaid Other | Attending: Physician Assistant | Admitting: Physician Assistant

## 2019-12-14 ENCOUNTER — Other Ambulatory Visit: Payer: Self-pay

## 2019-12-14 DIAGNOSIS — R21 Rash and other nonspecific skin eruption: Secondary | ICD-10-CM | POA: Diagnosis not present

## 2019-12-14 DIAGNOSIS — J029 Acute pharyngitis, unspecified: Secondary | ICD-10-CM

## 2019-12-14 MED ORDER — FLUTICASONE PROPIONATE 50 MCG/ACT NA SUSP
2.0000 | Freq: Every day | NASAL | 0 refills | Status: DC
Start: 2019-12-14 — End: 2020-05-09

## 2019-12-14 MED ORDER — MUPIROCIN 2 % EX OINT: 1.0000 "application " | TOPICAL_OINTMENT | Freq: Two times a day (BID) | CUTANEOUS | 0 refills | Status: DC

## 2019-12-14 MED ORDER — CEPHALEXIN 500 MG PO CAPS
500.0000 mg | ORAL_CAPSULE | Freq: Four times a day (QID) | ORAL | 0 refills | Status: DC
Start: 2019-12-14 — End: 2020-05-09

## 2019-12-14 MED ORDER — LIDOCAINE VISCOUS HCL 2 % MT SOLN
OROMUCOSAL | 0 refills | Status: DC
Start: 2019-12-14 — End: 2020-05-09

## 2019-12-14 NOTE — ED Triage Notes (Signed)
Pt present a rash that has spreaded to her legs, arms and hands. The rash is itching and burning. This has been going on for over week.

## 2019-12-14 NOTE — ED Provider Notes (Signed)
[name]    CSN: [id number] Arrival date & time: 12/14/19  1143      History   Chief Complaint Chief Complaint  Patient presents with  . Rash    HPI Patty Mata is a 29 y.o. female.   29 year old female comes in for multiple complaints.  1. 1+ week of rash. First started as small lesion to the finger, and now has spread to BUE/BLE. Blister like that is now ulcerated in appearance. Itching and burning in sensation. No fever. Daughter now with similar symptoms  2. URI symptoms x 1 week. States now with residual sore throat. Denies fever, shortness of breath.     Past Medical History:  Diagnosis Date  . Anxiety   . Depression   . History of kidney stones   . Kidney stones     Patient Active Problem List   Diagnosis Date Noted  . Substance induced mood disorder (HCC) 10/31/2019  . Severe recurrent major depression without psychotic features (HCC) 10/26/2019  . Alcohol-induced mood disorder (HCC)   . Cholestasis 03/03/2018  . Elevated liver function tests 02/19/2018  . No leakage of amniotic fluid into vagina 01/07/2018  . Rubella non-immune status, antepartum 11/04/2017  . Frequent UTI 10/30/2017  . Anxiety and depression 10/30/2017    Past Surgical History:  Procedure Laterality Date  . STENT PLACEMENT NON-VASCULAR ([facility] HX)     renal  . TUBAL LIGATION Bilateral 03/06/2018   Procedure: POST PARTUM TUBAL LIGATION;  Surgeon: Lesly Dukes, MD;  Location: [facility]IRTH[facility];  Service: Gynecology;  Laterality: Bilateral;    OB History    Gravida  2   Para  2   Term  2   Preterm      AB      Living  2     SAB      TAB      Ectopic      Multiple  0   Live Births  2            Home Medications    Prior to Admission medications   Medication Sig Start Date End Date Taking? Authorizing Provider  cephALEXin (KEFLEX) 500 MG capsule Take 1 capsule (500 mg total) by mouth 4 (four) times daily. 12/14/19   Cathie Hoops, Lindzey Zent V, PA-C    fluticasone (FLONASE) 50 MCG/ACT nasal spray Place 2 sprays into both nostrils daily. 12/14/19   Cathie Hoops, Rashae Rother V, PA-C  lidocaine (XYLOCAINE) 2 % solution 5-15 mL gargle as needed 12/14/19   Cathie Hoops, Tenaya Hilyer V, PA-C  mupirocin ointment (BACTROBAN) 2 % Apply 1 application topically 2 (two) times daily. 12/14/19   Cathie Hoops, Rishan Oyama V, PA-C  amLODipine (NORVASC) 5 MG tablet Take 1 tablet (5 mg total) by mouth daily. Patient not taking: Reported on 12/05/2018 03/07/18 07/14/19  Tilda Burrow, MD  sertraline (ZOLOFT) 25 MG tablet Take 1 tablet (25 mg total) by mouth daily for 7 days. 11/01/19 12/14/19  Antonieta Pert, MD    Family History Family History  Problem Relation Age of Onset  . Depression Mother   . Arthritis Mother   . Asthma Father   . Mental illness Father   . Cancer Maternal Grandfather     Social History Social History   Tobacco Use  . Smoking status: Current Every Day Smoker    Packs/day: 1.00    Years: 5.00    Pack years: 5.00    Types: Cigarettes  . Smokeless tobacco: Never Used  Vaping Use  . Vaping Use: Never used  Substance Use Topics  . Alcohol use: Not Currently  . Drug use: Not Currently     Allergies   Penicillins   Review of Systems Review of Systems  Reason unable to perform ROS: See HPI as above.     Physical Exam Triage Vital Signs ED Triage Vitals  Enc Vitals Group     BP 12/14/19 1332 130/89     Pulse Rate 12/14/19 1332 (!) 103     Resp 12/14/19 1332 18     Temp 12/14/19 1332 97.7 F (36.5 C)     Temp Source 12/14/19 1332 Oral     SpO2 12/14/19 1332 98 %     Weight --      Height --      Head Circumference --      Peak Flow --      Pain Score 12/14/19 1333 5     Pain Loc --      Pain Edu? --      Excl. in GC? --    No data found.  Updated Vital Signs BP 130/89 (BP Location: Left Arm)   Pulse (!) 103   Temp 97.7 F (36.5 C) (Oral)   Resp 18   LMP 11/18/2019 (Exact Date)   SpO2 98%   Visual Acuity Right Eye Distance:   Left Eye Distance:    Bilateral Distance:    Right Eye Near:   Left Eye Near:    Bilateral Near:     Physical Exam Constitutional:      General: She is not in acute distress.    Appearance: Normal appearance. She is not ill-appearing, toxic-appearing or diaphoretic.  HENT:     Head: Normocephalic and atraumatic.     Mouth/Throat:     Mouth: Mucous membranes are moist.     Pharynx: Oropharynx is clear. Uvula midline.  Cardiovascular:     Rate and Rhythm: Normal rate and regular rhythm.     Heart sounds: Normal heart sounds. No murmur heard.  No friction rub. No gallop.   Pulmonary:     Effort: Pulmonary effort is normal. No accessory muscle usage, prolonged expiration, respiratory distress or retractions.     Comments: Lungs clear to auscultation without adventitious lung sounds. Musculoskeletal:     Cervical back: Normal range of motion and neck supple.  Skin:    Comments: Few open bulla to BUE/BLE. Mild surrounding erythema, no warmth, induration  Neurological:     General: No focal deficit present.     Mental Status: She is alert and oriented to person, place, and time.      UC Treatments / Results  Labs (all labs ordered are listed, but only abnormal results are displayed) Labs Reviewed  NOVEL CORONAVIRUS, NAA    EKG   Radiology No results found.  Procedures Procedures (including critical care time)  Medications Ordered in UC Medications - No data to display  Initial Impression / Assessment and Plan / UC Course  I have reviewed the triage vital signs and the nursing notes.  Pertinent labs & imaging results that were available during my care of the patient were reviewed by me and considered in my medical decision making (see chart for details).    1. Rash Worrisome for impetigo. Keflex/bactroban as directed. Wound care instructions given.  2. URI symptoms/COVID testing COVID PCR test ordered. Patient to quarantine until testing results return. No alarming signs on exam.  LCTAB. Symptomatic treatment discussed.  Push fluids.  Return precautions given.  Patient expresses understanding and agrees to plan.  Final Clinical Impressions(s) / UC Diagnoses   Final diagnoses:  Rash  Sore throat    ED Prescriptions    Medication Sig Dispense Auth. Provider   cephALEXin (KEFLEX) 500 MG capsule Take 1 capsule (500 mg total) by mouth 4 (four) times daily. 28 capsule Copper Basnett V, PA-C   mupirocin ointment (BACTROBAN) 2 % Apply 1 application topically 2 (two) times daily. 22 g Oanh Devivo V, PA-C   lidocaine (XYLOCAINE) 2 % solution 5-15 mL gargle as needed 150 mL Angelin Cutrone V, PA-C   fluticasone (FLONASE) 50 MCG/ACT nasal spray Place 2 sprays into both nostrils daily. 1 g Belinda Fisher, PA-C     PDMP not reviewed this encounter.   Belinda Fisher, PA-C 12/15/19 1251

## 2019-12-14 NOTE — Discharge Instructions (Signed)
Rash Rash appearance suspicious for impetigo.  Start Keflex as directed.  Can also apply Bactroban to lesions.  Can take over-the-counter allergy medicine for itching.  Follow-up with PCP if symptoms not improving.  Monitor for further infection such as spreading redness, warmth, pain, fever.  Sore throat/COVID screening COVID PCR testing ordered. I would like you to quarantine until testing results. Start lidocaine for sore throat, do not eat or drink for the next 40 mins after use as it can stunt your gag reflex. Flonase for nasal drainage. Tylenol/motrin for pain and fever. Keep hydrated, urine should be clear to pale yellow in color. If experiencing shortness of breath, trouble breathing, go to the emergency department for further evaluation needed.

## 2019-12-16 LAB — SARS-COV-2, NAA 2 DAY TAT

## 2019-12-16 LAB — NOVEL CORONAVIRUS, NAA: SARS-CoV-2, NAA: NOT DETECTED

## 2020-04-30 ENCOUNTER — Encounter: Payer: Self-pay | Admitting: *Deleted

## 2020-04-30 ENCOUNTER — Ambulatory Visit
Admission: EM | Admit: 2020-04-30 | Discharge: 2020-04-30 | Disposition: A | Discharge status: Home / Self Care | Payer: Medicaid Other | Attending: Emergency Medicine | Admitting: Emergency Medicine

## 2020-04-30 ENCOUNTER — Other Ambulatory Visit: Payer: Self-pay

## 2020-04-30 DIAGNOSIS — N23 Unspecified renal colic: Secondary | ICD-10-CM | POA: Diagnosis not present

## 2020-04-30 DIAGNOSIS — Z87442 Personal history of urinary calculi: Secondary | ICD-10-CM | POA: Diagnosis not present

## 2020-04-30 HISTORY — DX: Poisoning by unspecified drugs, medicaments and biological substances, accidental (unintentional), initial encounter: T50.901A

## 2020-04-30 HISTORY — DX: Tubulo-interstitial nephritis, not specified as acute or chronic: N12

## 2020-04-30 LAB — POCT URINALYSIS DIP (MANUAL ENTRY)
Bilirubin, UA: NEGATIVE
Blood, UA: NEGATIVE
Glucose, UA: NEGATIVE mg/dL
Ketones, POC UA: NEGATIVE mg/dL
Leukocytes, UA: NEGATIVE
Nitrite, UA: NEGATIVE
Protein Ur, POC: NEGATIVE mg/dL
Spec Grav, UA: 1.025 (ref 1.010–1.025)
Urobilinogen, UA: 0.2 E.U./dL
pH, UA: 7 (ref 5.0–8.0)

## 2020-04-30 LAB — POCT URINE PREGNANCY: Preg Test, Ur: NEGATIVE

## 2020-04-30 MED ORDER — KETOROLAC TROMETHAMINE 30 MG/ML IJ SOLN
30.0000 mg | Freq: Once | INTRAMUSCULAR | Status: AC
  Administered 2020-04-30: 30 mg via INTRAMUSCULAR

## 2020-04-30 MED ORDER — KETOROLAC TROMETHAMINE 10 MG PO TABS: 10.0000 mg | ORAL_TABLET | Freq: Four times a day (QID) | ORAL | 0 refills | Status: DC | PRN

## 2020-04-30 MED ORDER — ONDANSETRON 4 MG PO TBDP
4.0000 mg | ORAL_TABLET | Freq: Three times a day (TID) | ORAL | 0 refills | Status: DC | PRN
Start: 2020-04-30 — End: 2020-05-09

## 2020-04-30 NOTE — ED Triage Notes (Signed)
States onset of pain was in right abdomen along with shooting pains going down BLE, then started with periumbilical burning 1.5 wks ago, followed by bilat flank pain over past 3 days - R>L.  Also c/o dysuria.  Denies fevers, but c/o nausea.

## 2020-04-30 NOTE — ED Provider Notes (Signed)
[name]    CSN: [id number] Arrival date & time: 04/30/20  1352      History   Chief Complaint Chief Complaint  Patient presents with  . Flank Pain    HPI Patty Mata is a 30 y.o. female  With history of pyelonephritis, renal calculi presenting for right flank pain for the last week, worse in the last 3 days.  Notes intermittent burning with urination.  No hematuria, vaginal discharge or pelvic pain.  Does have some nausea, though no fever, vomiting, chest pain or difficulty breathing.  Last kidney stone was in December 2021.  Denies history of obstructive calculi.  Past Medical History:  Diagnosis Date  . Accidental drug overdose   . Anxiety   . Depression   . History of kidney stones   . Kidney stones   . Pyelonephritis     Patient Active Problem List   Diagnosis Date Noted  . Substance induced mood disorder (HCC) 10/31/2019  . Severe recurrent major depression without psychotic features (HCC) 10/26/2019  . Alcohol-induced mood disorder (HCC)   . Cholestasis 03/03/2018  . Elevated liver function tests 02/19/2018  . No leakage of amniotic fluid into vagina 01/07/2018  . Rubella non-immune status, antepartum 11/04/2017  . Frequent UTI 10/30/2017  . Anxiety and depression 10/30/2017    Past Surgical History:  Procedure Laterality Date  . STENT PLACEMENT NON-VASCULAR ([facility] HX)     renal  . TUBAL LIGATION Bilateral 03/06/2018   Procedure: POST PARTUM TUBAL LIGATION;  Surgeon: Lesly Dukes, MD;  Location: [facility]IR[facility] SU[facility];  Service: Gynecology;  Laterality: Bilateral;    OB History    Gravida  2   Para  2   Term  2   Preterm      AB      Living  2     SAB      IAB      Ectopic      Multiple  0   Live Births  2            Home Medications    Prior to Admission medications   Medication Sig Start Date End Date Taking? Authorizing Provider  ketorolac (TORADOL) 10 MG tablet Take 1 tablet (10 mg total) by mouth every 6  (six) hours as needed. 04/30/20  Yes Hall-Potvin, [place], PA-C  ondansetron (ZOFRAN ODT) 4 MG disintegrating tablet Take 1 tablet (4 mg total) by mouth every 8 (eight) hours as needed for nausea or vomiting. 04/30/20  Yes Hall-Potvin, [place], PA-C  cephALEXin (KEFLEX) 500 MG capsule Take 1 capsule (500 mg total) by mouth 4 (four) times daily. 12/14/19   Cathie Hoops, Amy V, PA-C  fluticasone (FLONASE) 50 MCG/ACT nasal spray Place 2 sprays into both nostrils daily. 12/14/19   Cathie Hoops, Amy V, PA-C  lidocaine (XYLOCAINE) 2 % solution 5-15 mL gargle as needed 12/14/19   Cathie Hoops, Amy V, PA-C  mupirocin ointment (BACTROBAN) 2 % Apply 1 application topically 2 (two) times daily. 12/14/19   Cathie Hoops, Amy V, PA-C  amLODipine (NORVASC) 5 MG tablet Take 1 tablet (5 mg total) by mouth daily. Patient not taking: Reported on 12/05/2018 03/07/18 07/14/19  Tilda Burrow, MD  sertraline (ZOLOFT) 25 MG tablet Take 1 tablet (25 mg total) by mouth daily for 7 days. 11/01/19 12/14/19  Antonieta Pert, MD    Family History Family History  Problem Relation Age of Onset  . Depression Mother   . Arthritis Mother   . Asthma  Father   . Mental illness Father   . Cancer Maternal Grandfather     Social History Social History   Tobacco Use  . Smoking status: Current Every Day Smoker    Packs/day: 1.00    Years: 5.00    Pack years: 5.00    Types: Cigarettes  . Smokeless tobacco: Never Used  Vaping Use  . Vaping Use: Never used  Substance Use Topics  . Alcohol use: Not Currently  . Drug use: Not Currently    Comment: Xanax     Allergies   Penicillins   Review of Systems Review of Systems  Constitutional: Negative for fatigue and fever.  Respiratory: Negative for cough and shortness of breath.   Cardiovascular: Negative for chest pain and palpitations.  Gastrointestinal: Positive for nausea. Negative for constipation, diarrhea and vomiting.  Genitourinary: Positive for dysuria. Negative for flank pain, frequency, hematuria,  pelvic pain, urgency, vaginal bleeding, vaginal discharge and vaginal pain.  Musculoskeletal: Positive for back pain.     Physical Exam Triage Vital Signs ED Triage Vitals  Enc Vitals Group     BP 04/30/20 1404 (!) 141/98     Pulse Rate 04/30/20 1404 (!) 111     Resp 04/30/20 1404 20     Temp 04/30/20 1404 97.6 F (36.4 C)     Temp Source 04/30/20 1404 Oral     SpO2 04/30/20 1404 99 %     Weight --      Height --      Head Circumference --      Peak Flow --      Pain Score 04/30/20 1412 8     Pain Loc --      Pain Edu? --      Excl. in GC? --    No data found.  Updated Vital Signs BP (!) 141/98 (BP Location: Left Arm)   Pulse (!) 111   Temp 97.6 F (36.4 C) (Oral)   Resp 20   LMP 03/24/2020 (Exact Date)   SpO2 99%   Visual Acuity Right Eye Distance:   Left Eye Distance:   Bilateral Distance:    Right Eye Near:   Left Eye Near:    Bilateral Near:     Physical Exam Constitutional:      General: She is not in acute distress. HENT:     Head: Normocephalic and atraumatic.  Eyes:     General: No scleral icterus.    Pupils: Pupils are equal, round, and reactive to light.  Cardiovascular:     Rate and Rhythm: Normal rate.  Pulmonary:     Effort: Pulmonary effort is normal.  Abdominal:     General: Bowel sounds are normal.     Palpations: Abdomen is soft.     Tenderness: There is no abdominal tenderness. There is right CVA tenderness. There is no left CVA tenderness or guarding.  Skin:    Coloration: Skin is not jaundiced or pale.     Findings: No rash.  Neurological:     Mental Status: She is alert and oriented to person, place, and time.      UC Treatments / Results  Labs (all labs ordered are listed, but only abnormal results are displayed) Labs Reviewed  POCT URINALYSIS DIP (MANUAL ENTRY)  POCT URINE PREGNANCY    EKG   Radiology No results found.  Procedures Procedures (including critical care time)  Medications Ordered in  UC Medications  ketorolac (TORADOL) 30 MG/ML injection 30 mg (30 mg  Intramuscular Given 04/30/20 1432)    Initial Impression / Assessment and Plan / UC Course  I have reviewed the triage vital signs and the nursing notes.  Pertinent labs & imaging results that were available during my care of the patient were reviewed by me and considered in my medical decision making (see chart for details).     Urine dip and pregnancy negative.  Given H&P, most concerning for renal colic, likely due to stone.  Provided strainer, Toradol shot in office.  Will treat supportively as below, follow-up with PCP, urology in the next 2 weeks.  ER return precautions discussed, pt verbalized understanding and is agreeable to plan. Final Clinical Impressions(s) / UC Diagnoses   Final diagnoses:  Renal colic on right side  History of kidney stones     Discharge Instructions     Drink plenty of fluids (water, sugar-free drinks) throughout the day. May take Toradol as needed for pain: No ibuprofen, Motrin, Advil, Aleve, naproxen (NSAIDs) while taking this. Zofran as needed for nausea. Use urine strainer when urinating.   If you catch a stone, please follow-up with your PCP or urology for further evaluation. Go to ER for blood in urine, worsening abdominal or back pain, vomiting, fever.    ED Prescriptions    Medication Sig Dispense Auth. Provider   ketorolac (TORADOL) 10 MG tablet Take 1 tablet (10 mg total) by mouth every 6 (six) hours as needed. 20 tablet Hall-Potvin, [place], PA-C   ondansetron (ZOFRAN ODT) 4 MG disintegrating tablet Take 1 tablet (4 mg total) by mouth every 8 (eight) hours as needed for nausea or vomiting. 21 tablet Hall-Potvin, [place], PA-C     PDMP not reviewed this encounter.   Hall-Potvin, [place], [place] 04/30/20 1450

## 2020-04-30 NOTE — Discharge Instructions (Addendum)
Drink plenty of fluids (water, sugar-free drinks) throughout the day. May take Toradol as needed for pain: No ibuprofen, Motrin, Advil, Aleve, naproxen (NSAIDs) while taking this. Zofran as needed for nausea. Use urine strainer when urinating.   If you catch a stone, please follow-up with your PCP or urology for further evaluation. Go to ER for blood in urine, worsening abdominal or back pain, vomiting, fever. 

## 2020-05-07 ENCOUNTER — Other Ambulatory Visit: Payer: Self-pay

## 2020-05-07 ENCOUNTER — Emergency Department (HOSPITAL_COMMUNITY)
Admission: EM | Admit: 2020-05-07 | Discharge: 2020-05-09 | Disposition: A | Discharge status: Home / Self Care | Payer: Medicaid Other | Attending: Emergency Medicine | Admitting: Emergency Medicine

## 2020-05-07 DIAGNOSIS — N760 Acute vaginitis: Secondary | ICD-10-CM | POA: Diagnosis not present

## 2020-05-07 DIAGNOSIS — F17210 Nicotine dependence, cigarettes, uncomplicated: Secondary | ICD-10-CM | POA: Diagnosis not present

## 2020-05-07 DIAGNOSIS — R109 Unspecified abdominal pain: Secondary | ICD-10-CM | POA: Insufficient documentation

## 2020-05-07 DIAGNOSIS — B9689 Other specified bacterial agents as the cause of diseases classified elsewhere: Secondary | ICD-10-CM

## 2020-05-07 NOTE — ED Triage Notes (Signed)
Pt presents to ED POV. Pt c/o R flank pain x85m. Pt reports stomach is boating, pain during intercourse, pain w/ urination. pt reports that she has hx of similar kidney infection and stones in past. Pt also reports late menstrual cycle but pt had tubal ligation.

## 2020-05-08 LAB — CBC
HCT: 37.4 % (ref 36.0–46.0)
Hemoglobin: 11.9 g/dL — ABNORMAL LOW (ref 12.0–15.0)
MCH: 30.7 pg (ref 26.0–34.0)
MCHC: 31.8 g/dL (ref 30.0–36.0)
MCV: 96.6 fL (ref 80.0–100.0)
Platelets: 203 10*3/uL (ref 150–400)
RBC: 3.87 MIL/uL (ref 3.87–5.11)
RDW: 13.8 % (ref 11.5–15.5)
WBC: 6.5 10*3/uL (ref 4.0–10.5)
nRBC: 0 % (ref 0.0–0.2)

## 2020-05-08 LAB — BASIC METABOLIC PANEL
Anion gap: 11 (ref 5–15)
BUN: 25 mg/dL — ABNORMAL HIGH (ref 6–20)
CO2: 26 mmol/L (ref 22–32)
Calcium: 9.2 mg/dL (ref 8.9–10.3)
Chloride: 101 mmol/L (ref 98–111)
Creatinine, Ser: 0.6 mg/dL (ref 0.44–1.00)
GFR, Estimated: >60 mL/min (ref >60)
Glucose, Bld: 98 mg/dL (ref 70–99)
Potassium: 3.6 mmol/L (ref 3.5–5.1)
Sodium: 138 mmol/L (ref 135–145)

## 2020-05-08 NOTE — ED Notes (Signed)
Patient seen returning from [place] area

## 2020-05-08 NOTE — ED Notes (Signed)
Patient called x3 for vitals withno response. This NT checked corners of lobby and found no one sleeping

## 2020-05-09 LAB — GC/CHLAMYDIA PROBE AMP (~~LOC~~) NOT AT ARMC
Chlamydia: NEGATIVE
Comment: NEGATIVE
Comment: NORMAL
Neisseria Gonorrhea: NEGATIVE

## 2020-05-09 LAB — URINALYSIS, ROUTINE W REFLEX MICROSCOPIC
Bilirubin Urine: NEGATIVE
Glucose, UA: NEGATIVE mg/dL
Hgb urine dipstick: NEGATIVE
Ketones, ur: NEGATIVE mg/dL
Leukocytes,Ua: NEGATIVE
Nitrite: NEGATIVE
Protein, ur: NEGATIVE mg/dL
Specific Gravity, Urine: >1.03 — ABNORMAL HIGH (ref 1.005–1.030)
pH: 5.5 (ref 5.0–8.0)

## 2020-05-09 LAB — WET PREP, GENITAL
Sperm: NONE SEEN
Trich, Wet Prep: NONE SEEN
Yeast Wet Prep HPF POC: NONE SEEN

## 2020-05-09 LAB — RPR: RPR Ser Ql: NONREACTIVE

## 2020-05-09 LAB — PREGNANCY, URINE: Preg Test, Ur: NEGATIVE

## 2020-05-09 MED ORDER — METRONIDAZOLE 500 MG PO TABS: 500.0000 mg | ORAL_TABLET | Freq: Two times a day (BID) | ORAL | 0 refills | Status: DC

## 2020-05-09 MED ORDER — ONDANSETRON 4 MG PO TBDP: 4.0000 mg | ORAL_TABLET | Freq: Three times a day (TID) | ORAL | 0 refills | Status: DC | PRN

## 2020-05-09 MED ORDER — ACETAMINOPHEN 500 MG PO TABS
1000.0000 mg | ORAL_TABLET | Freq: Once | ORAL | Status: AC
  Administered 2020-05-09: 1000 mg via ORAL
  Filled 2020-05-09: qty 2

## 2020-05-09 NOTE — ED Provider Notes (Signed)
[facility] EMERGENCY DEPARTMENT Provider Note   CSN: [id number] Arrival date & time: 05/07/20  2243     History Chief Complaint  Patient presents with  . Flank Pain    Patty Mata is a 30 y.o. female.  HPI Patient is 30 year old female with a past medical history significant for anxiety, depression, kidney stones, pyelonephritis.  She is presented today with concerns for pyelonephritis given that she has had a kidney stone and a kidney infection in the past.  She states that this pain is much more mild than those episodes however.  She states that for the past 3 weeks she has felt some discomfort in her back.  She states that she has not taken any medications for this.  She states it seems to be worse with moving around.  She describes it as achy pain in the right side.  She states that sometimes she feels that she is somewhat bloated in her abdomen.  She is having normal bowel movements denies any dysuria, frequency or urgency.  She states that she did have one episode of dyspareunia with her boyfriend.  She states that she is not having any vaginal discharge, vaginal bleeding, vaginal irritation.  She states that she has not had any other vaginal symptoms apart from this.  She states that the worst her pain is a 3/10.  She also denies any fevers, chills, nausea or vomiting.     Past Medical History:  Diagnosis Date  . Accidental drug overdose   . Anxiety   . Depression   . History of kidney stones   . Kidney stones   . Pyelonephritis     Patient Active Problem List   Diagnosis Date Noted  . Substance induced mood disorder (HCC) 10/31/2019  . Severe recurrent major depression without psychotic features (HCC) 10/26/2019  . Alcohol-induced mood disorder (HCC)   . Cholestasis 03/03/2018  . Elevated liver function tests 02/19/2018  . No leakage of amniotic fluid into vagina 01/07/2018  . Rubella non-immune status, antepartum 11/04/2017  . Frequent UTI  10/30/2017  . Anxiety and depression 10/30/2017    Past Surgical History:  Procedure Laterality Date  . STENT PLACEMENT NON-VASCULAR ([facility] HX)     renal  . TUBAL LIGATION Bilateral 03/06/2018   Procedure: POST PARTUM TUBAL LIGATION;  Surgeon: Lesly Dukes, MD;  Location: [facility] BIRTHING SUITES;  Service: Gynecology;  Laterality: Bilateral;     OB History    Gravida  2   Para  2   Term  2   Preterm      AB      Living  2     SAB      IAB      Ectopic      Multiple  0   Live Births  2           Family History  Problem Relation Age of Onset  . Depression Mother   . Arthritis Mother   . Asthma Father   . Mental illness Father   . Cancer Maternal Grandfather     Social History   Tobacco Use  . Smoking status: Current Every Day Smoker    Packs/day: 1.00    Years: 5.00    Pack years: 5.00    Types: Cigarettes  . Smokeless tobacco: Never Used  Vaping Use  . Vaping Use: Never used  Substance Use Topics  . Alcohol use: Not Currently  . Drug use: Not Currently  Comment: Xanax    Home Medications Prior to Admission medications   Medication Sig Start Date End Date Taking? Authorizing Provider  metroNIDAZOLE (FLAGYL) 500 MG tablet Take 1 tablet (500 mg total) by mouth 2 (two) times daily. 05/09/20  Yes Argus Caraher S, PA  ondansetron (ZOFRAN ODT) 4 MG disintegrating tablet Take 1 tablet (4 mg total) by mouth every 8 (eight) hours as needed for nausea or vomiting. 05/09/20  Yes Jaylissa Felty S, PA  amLODipine (NORVASC) 5 MG tablet Take 1 tablet (5 mg total) by mouth daily. Patient not taking: Reported on 12/05/2018 03/07/18 07/14/19  Tilda Burrow, MD  sertraline (ZOLOFT) 25 MG tablet Take 1 tablet (25 mg total) by mouth daily for 7 days. 11/01/19 12/14/19  Antonieta Pert, MD    Allergies    Penicillins  Review of Systems   Review of Systems  Constitutional: Negative for chills and fever.  HENT: Negative for congestion.   Eyes: Negative for pain.   Respiratory: Negative for cough and shortness of breath.   Cardiovascular: Negative for chest pain and leg swelling.  Gastrointestinal: Negative for abdominal pain and vomiting.  Genitourinary: Positive for flank pain. Negative for dysuria, frequency, genital sores, hematuria and menstrual problem.       1 episode of dyspareunia  Musculoskeletal: Negative for myalgias.  Skin: Negative for rash.  Neurological: Negative for dizziness and headaches.    Physical Exam Updated Vital Signs BP 117/77 (BP Location: Right Arm)   Pulse 86   Temp 97.6 F (36.4 C) (Oral)   Resp 12   SpO2 100%   Physical Exam Vitals and nursing note reviewed.  Constitutional:      General: She is not in acute distress.    Comments: Pleasant well-appearing 30 year old.  In no acute distress.  Sitting comfortably in bed.  Able answer questions appropriately follow commands. No increased work of breathing. Speaking in full sentences.  HENT:     Head: Normocephalic and atraumatic.     Nose: Nose normal.  Eyes:     General: No scleral icterus. Cardiovascular:     Rate and Rhythm: Normal rate and regular rhythm.     Pulses: Normal pulses.     Heart sounds: Normal heart sounds.  Pulmonary:     Effort: Pulmonary effort is normal. No respiratory distress.     Breath sounds: No wheezing.  Abdominal:     Palpations: Abdomen is soft.     Tenderness: There is no abdominal tenderness. There is no right CVA tenderness, left CVA tenderness, guarding or rebound.  Genitourinary:    Comments: Vulva without lesions or abnormality Vaginal canal without abnormal discharge or lesion Cervix appears normal, is closed No adnexal tenderness or CMT  Musculoskeletal:     Cervical back: Normal range of motion.     Right lower leg: No edema.     Left lower leg: No edema.  Skin:    General: Skin is warm and dry.     Capillary Refill: Capillary refill takes less than 2 seconds.  Neurological:     Mental Status: She is alert.  Mental status is at baseline.  Psychiatric:        Mood and Affect: Mood normal.        Behavior: Behavior normal.     ED Results / Procedures / Treatments   Labs (all labs ordered are listed, but only abnormal results are displayed) Labs Reviewed  WET PREP, GENITAL - Abnormal; Notable for the following components:  Result Value   Clue Cells Wet Prep HPF POC PRESENT (*)    WBC, Wet Prep HPF POC MANY (*)    All other components within normal limits  BASIC METABOLIC PANEL - Abnormal; Notable for the following components:   BUN 25 (*)    All other components within normal limits  CBC - Abnormal; Notable for the following components:   Hemoglobin 11.9 (*)    All other components within normal limits  URINALYSIS, ROUTINE W REFLEX MICROSCOPIC - Abnormal; Notable for the following components:   Specific Gravity, Urine >1.030 (*)    All other components within normal limits  PREGNANCY, URINE  URINALYSIS, ROUTINE W REFLEX MICROSCOPIC  RPR  I-STAT BETA HCG BLOOD, ED ([facility], [facility], [facility] ONLY)  GC/CHLAMYDIA PROBE AMP ([place]) NOT AT [facility]    EKG None  Radiology No results found.  Procedures Procedures   Medications Ordered in ED Medications - No data to display  ED Course  I have reviewed the triage vital signs and the nursing notes.  Pertinent labs & imaging results that were available during my care of the patient were reviewed by me and considered in my medical decision making (see chart for details).  Patient is a somewhat anxious 30 year old female presenting today with some right flank pain.  On my palpation she does not seem to have any pain she does not have any CVA tenderness.  She is complaining of no pain presently.  She is overall very well-appearing has vital signs within normal limits.  I have low suspicion for pyelonephritis, nephrolithiasis, ureterolithiasis. She does have a history of drug use however she has no fever and no midline back pain.  No neurologic  symptoms.  I have low suspicion for this being cauda equina, spinal epidural abscess or other emergent cause of back pain.  Clinical Course as of 05/09/20 0410  Tue May 09, 2020  0404 Clue Cells Wet Prep HPF POC(!): PRESENT Evidence for bacterial vaginitis.  Patient does not have any vaginal discharge that is unusual for her.  She has no dyspareunia apart from one episode.  Doubt PID she has no cervical motion tenderness and there is low suspicion for STD--NAT is pending.  We will provide with printed prescription for Flagyl.  She will follow up with her OB/GYN. [WF]  0405  I personally reviewed all laboratory work and imaging.  Metabolic panel without any acute abnormality specifically kidney function within normal limits and no significant electrolyte abnormalities. CBC without leukocytosis or significant anemia.  [WF]  0405 Urine pregnancy test is negative.  Wet prep reviewed separately.  Urinalysis with negative nitrites and leukocytes.  Doubt infection/UTI/Pylo. [WF]  0405 Patient has no abdominal pain.  On reexamination she continues to have no significant symptoms. Her back pain seems to have been intermittent and relatively common over the past 3 weeks but does not seem to be associated with any red flags for further imaging and testing.  I have low suspicion for kidney stone given her very mild symptoms. [WF]    Clinical Course User Index [WF] Gailen Shelter, [place]   MDM Rules/Calculators/A&P                          We will discharge patient with follow-up with her PCP and OB/GYN.  She was given prescription for Flagyl and Zofran.   Final Clinical Impression(s) / ED Diagnoses Final diagnoses:  Flank pain  Bacterial vaginitis  Rx / DC Orders ED Discharge Orders         Ordered    metroNIDAZOLE (FLAGYL) 500 MG tablet  2 times daily        05/09/20 0401    ondansetron (ZOFRAN ODT) 4 MG disintegrating tablet  Every 8 hours PRN        05/09/20 0402           Gailen Shelter, PA 05/09/20 0410    Shon Baton, MD 05/09/20 [phone]

## 2020-05-09 NOTE — Discharge Instructions (Signed)
Your work-up today was reassuring.  As we discussed I doubt that you have an infection causing her symptoms.  Notably you do have evidence of bacterial vaginosis which is a relatively benign overgrowth of bacteria which can normally be found in the vagina.  If you are having symptoms such as vaginal pain and discharge it is reasonable to treat.  Given that you are having some pain with sex I think it is reasonable to treat.  I will print the prescription for you and you may take this for the entire course if you wish to you or hold off and follow-up with your OB/GYN.  I have prescribed you Zofran to use for nausea as needed.  Please drink plenty of water and use Tylenol and ibuprofen for pain as discussed below  Please use Tylenol or ibuprofen for pain.  You may use 600 mg ibuprofen every 6 hours or 1000 mg of Tylenol every 6 hours.  You may choose to alternate between the 2.  This would be most effective.  Not to exceed 4 g of Tylenol within 24 hours.  Not to exceed 3200 mg ibuprofen 24 hours.

## 2020-05-24 ENCOUNTER — Encounter (HOSPITAL_COMMUNITY): Payer: Self-pay | Admitting: Emergency Medicine

## 2020-05-24 ENCOUNTER — Emergency Department (HOSPITAL_COMMUNITY)
Admission: EM | Admit: 2020-05-24 | Discharge: 2020-05-24 | Disposition: A | Discharge status: Home / Self Care | Payer: Medicaid Other | Attending: Emergency Medicine | Admitting: Emergency Medicine

## 2020-05-24 ENCOUNTER — Emergency Department (HOSPITAL_COMMUNITY): Payer: Medicaid Other

## 2020-05-24 DIAGNOSIS — F17210 Nicotine dependence, cigarettes, uncomplicated: Secondary | ICD-10-CM | POA: Diagnosis not present

## 2020-05-24 DIAGNOSIS — R109 Unspecified abdominal pain: Secondary | ICD-10-CM | POA: Diagnosis not present

## 2020-05-24 DIAGNOSIS — N926 Irregular menstruation, unspecified: Secondary | ICD-10-CM | POA: Insufficient documentation

## 2020-05-24 DIAGNOSIS — R1110 Vomiting, unspecified: Secondary | ICD-10-CM | POA: Diagnosis not present

## 2020-05-24 LAB — CBC
HCT: 39.9 % (ref 36.0–46.0)
Hemoglobin: 13.3 g/dL (ref 12.0–15.0)
MCH: 31.6 pg (ref 26.0–34.0)
MCHC: 33.3 g/dL (ref 30.0–36.0)
MCV: 94.8 fL (ref 80.0–100.0)
Platelets: 178 10*3/uL (ref 150–400)
RBC: 4.21 MIL/uL (ref 3.87–5.11)
RDW: 13.6 % (ref 11.5–15.5)
WBC: 4.5 10*3/uL (ref 4.0–10.5)
nRBC: 0 % (ref 0.0–0.2)

## 2020-05-24 LAB — URINALYSIS, ROUTINE W REFLEX MICROSCOPIC
Bilirubin Urine: NEGATIVE
Glucose, UA: NEGATIVE mg/dL
Hgb urine dipstick: NEGATIVE
Ketones, ur: NEGATIVE mg/dL
Leukocytes,Ua: NEGATIVE
Nitrite: NEGATIVE
Protein, ur: NEGATIVE mg/dL
Specific Gravity, Urine: 1.021 (ref 1.005–1.030)
pH: 7 (ref 5.0–8.0)

## 2020-05-24 LAB — COMPREHENSIVE METABOLIC PANEL
ALT: 24 U/L (ref 0–44)
AST: 20 U/L (ref 15–41)
Albumin: 3.8 g/dL (ref 3.5–5.0)
Alkaline Phosphatase: 57 U/L (ref 38–126)
Anion gap: 7 (ref 5–15)
BUN: 12 mg/dL (ref 6–20)
CO2: 28 mmol/L (ref 22–32)
Calcium: 9.1 mg/dL (ref 8.9–10.3)
Chloride: 100 mmol/L (ref 98–111)
Creatinine, Ser: 0.59 mg/dL (ref 0.44–1.00)
GFR, Estimated: >60 mL/min (ref >60)
Glucose, Bld: 96 mg/dL (ref 70–99)
Potassium: 3.8 mmol/L (ref 3.5–5.1)
Sodium: 135 mmol/L (ref 135–145)
Total Bilirubin: 0.9 mg/dL (ref 0.3–1.2)
Total Protein: 7 g/dL (ref 6.5–8.1)

## 2020-05-24 LAB — LIPASE, BLOOD: Lipase: 23 U/L (ref 11–51)

## 2020-05-24 LAB — I-STAT BETA HCG BLOOD, ED (MC, WL, AP ONLY): I-stat hCG, quantitative: <5 m[IU]/mL (ref <5)

## 2020-05-24 MED ORDER — MELOXICAM 15 MG PO TABS: 15.0000 mg | ORAL_TABLET | Freq: Every day | ORAL | 0 refills | Status: DC | PRN

## 2020-05-24 NOTE — Discharge Instructions (Signed)
Please read and follow all provided instructions.  Your diagnoses today include:  1. Flank pain   2. Irregular periods/menstrual cycles     Tests performed today include:  Blood cell counts and platelets  Kidney and liver function tests  Pancreas function test (called lipase)  Urine test to look for infection - no infection or bleeding  A blood or urine test for pregnancy (women only) - you are not pregnant  CT scan - shows a very small stone inside the right kidney but no kidney stones in a place that should cause pain; it also shows a small amount of fluid in the pelvic that could have been from a ruptured ovarian cyst  Vital signs. See below for your results today.   Medications prescribed:   Meloxicam - anti-inflammatory pain medication  You have been prescribed an anti-inflammatory medication or NSAID. Take with food. Do not take aspirin, ibuprofen, or naproxen if taking this medication. Take smallest effective dose for the shortest duration needed for your pain. Stop taking if you experience stomach pain or vomiting.   Take any prescribed medications only as directed.  Home care instructions:   Follow any educational materials contained in this packet.  Follow-up instructions: Please follow-up with your primary care provider in the next 7 days for further evaluation of your symptoms.    Return instructions:  SEEK IMMEDIATE MEDICAL ATTENTION IF:  The pain does not go away or becomes severe   A temperature above 101F develops   Repeated vomiting occurs (multiple episodes)   The pain becomes localized to portions of the abdomen. The right side could possibly be appendicitis. In an adult, the left lower portion of the abdomen could be colitis or diverticulitis.   Blood is being passed in stools or vomit (bright red or black tarry stools)   You develop chest pain, difficulty breathing, dizziness or fainting, or become confused, poorly responsive, or inconsolable  (young children)  If you have any other emergent concerns regarding your health  Additional Information: Abdominal (belly) pain can be caused by many things. Your caregiver performed an examination and possibly ordered blood/urine tests and imaging (CT scan, x-rays, ultrasound). Many cases can be observed and treated at home after initial evaluation in the emergency department. Even though you are being discharged home, abdominal pain can be unpredictable. Therefore, you need a repeated exam if your pain does not resolve, returns, or worsens. Most patients with abdominal pain don't have to be admitted to the hospital or have surgery, but serious problems like appendicitis and gallbladder attacks can start out as nonspecific pain. Many abdominal conditions cannot be diagnosed in one visit, so follow-up evaluations are very important.  Your vital signs today were: BP (!) 134/110 (BP Location: Right Arm)   Pulse 96   Temp 98.4 F (36.9 C)   Resp 16   SpO2 100%  If your blood pressure (bp) was elevated above 135/85 this visit, please have this repeated by your doctor within one month. --------------

## 2020-05-24 NOTE — ED Notes (Signed)
Pt returns from ct scan. 

## 2020-05-24 NOTE — ED Triage Notes (Signed)
Patient complains of scraping flank pain, nausea and groin pain that started approximately a month ago and has been getting progressively worse, history of kidney stones. Patient also reports concern that she has not had a period in two months, had a tubal ligation in 2019.

## 2020-05-24 NOTE — ED Provider Notes (Signed)
[facility] EMERGENCY DEPARTMENT Provider Note   CSN: [id number] Arrival date & time: 05/24/20  3419     History Chief Complaint  Patient presents with  . Flank Pain    Patty Mata is a 30 y.o. female.  Patient with history of pyelonephritis and kidney stones, tubal ligation presents the emergency department for ongoing bilateral flank pain.  Patient describes intermittent flank pain over the past 1 month.  Patient was seen at urgent care on 04/30/20 for the same and was treated with IM Toradol.  She followed up in the emergency department 05/09/20 and had vaginal exam and STI testing.  She was treated for BV at that time.  Patient describes a sharp stabbing pain that occurs which she states is like someone "stabbing my uterus".  She also has pain on the sides of her lower abdomen.  She has associated vomiting with severe episodes, last episode yesterday.  No fevers, chest pain, cough, or shortness of breath.  No hematuria or irritative UTI symptoms including dysuria, increased frequency or urgency.  She denies vaginal bleeding or discharge.  No treatments prior to arrival.  She states that she has not had a menstrual period since December 2021 and took a home pregnancy test which was negative.        Past Medical History:  Diagnosis Date  . Accidental drug overdose   . Anxiety   . Depression   . History of kidney stones   . Kidney stones   . Pyelonephritis     Patient Active Problem List   Diagnosis Date Noted  . Substance induced mood disorder (HCC) 10/31/2019  . Severe recurrent major depression without psychotic features (HCC) 10/26/2019  . Alcohol-induced mood disorder (HCC)   . Cholestasis 03/03/2018  . Elevated liver function tests 02/19/2018  . No leakage of amniotic fluid into vagina 01/07/2018  . Rubella non-immune status, antepartum 11/04/2017  . Frequent UTI 10/30/2017  . Anxiety and depression 10/30/2017    Past Surgical History:  Procedure  Laterality Date  . STENT PLACEMENT NON-VASCULAR ([facility] HX)     renal  . TUBAL LIGATION Bilateral 03/06/2018   Procedure: POST PARTUM TUBAL LIGATION;  Surgeon: Lesly Dukes, MD;  Location: [facility] BIRTHING SUITES;  Service: Gynecology;  Laterality: Bilateral;     OB History    Gravida  2   Para  2   Term  2   Preterm      AB      Living  2     SAB      IAB      Ectopic      Multiple  0   Live Births  2           Family History  Problem Relation Age of Onset  . Depression Mother   . Arthritis Mother   . Asthma Father   . Mental illness Father   . Cancer Maternal Grandfather     Social History   Tobacco Use  . Smoking status: Current Every Day Smoker    Packs/day: 1.00    Years: 5.00    Pack years: 5.00    Types: Cigarettes  . Smokeless tobacco: Never Used  Vaping Use  . Vaping Use: Never used  Substance Use Topics  . Alcohol use: Not Currently  . Drug use: Not Currently    Comment: Xanax    Home Medications Prior to Admission medications   Medication Sig Start Date End Date Taking? Authorizing  Provider  metroNIDAZOLE (FLAGYL) 500 MG tablet Take 1 tablet (500 mg total) by mouth 2 (two) times daily. 05/09/20   Gailen Shelter, PA  ondansetron (ZOFRAN ODT) 4 MG disintegrating tablet Take 1 tablet (4 mg total) by mouth every 8 (eight) hours as needed for nausea or vomiting. 05/09/20   Gailen Shelter, PA  amLODipine (NORVASC) 5 MG tablet Take 1 tablet (5 mg total) by mouth daily. Patient not taking: Reported on 12/05/2018 03/07/18 07/14/19  Tilda Burrow, MD  sertraline (ZOLOFT) 25 MG tablet Take 1 tablet (25 mg total) by mouth daily for 7 days. 11/01/19 12/14/19  Antonieta Pert, MD    Allergies    Penicillins  Review of Systems   Review of Systems  Constitutional: Negative for fever.  HENT: Negative for rhinorrhea and sore throat.   Eyes: Negative for redness.  Respiratory: Negative for cough.   Cardiovascular: Negative for chest pain.   Gastrointestinal: Positive for nausea and vomiting. Negative for abdominal pain and diarrhea.  Genitourinary: Positive for flank pain and pelvic pain. Negative for dysuria, frequency, hematuria and urgency.  Musculoskeletal: Negative for myalgias.  Skin: Negative for rash.  Allergic/Immunologic: Positive for food allergies.  Neurological: Negative for headaches.    Physical Exam Updated Vital Signs BP (!) 134/110 (BP Location: Right Arm)   Pulse 96   Temp 98.4 F (36.9 C)   Resp 16   SpO2 100%   Physical Exam Vitals and nursing note reviewed.  Constitutional:      General: She is not in acute distress.    Appearance: She is well-developed.  HENT:     Head: Normocephalic and atraumatic.     Right Ear: External ear normal.     Left Ear: External ear normal.     Nose: Nose normal.  Eyes:     Conjunctiva/sclera: Conjunctivae normal.  Cardiovascular:     Rate and Rhythm: Normal rate and regular rhythm.     Heart sounds: No murmur heard.   Pulmonary:     Effort: No respiratory distress.     Breath sounds: No wheezing, rhonchi or rales.  Abdominal:     Palpations: Abdomen is soft.     Tenderness: There is no abdominal tenderness. There is no guarding or rebound.  Musculoskeletal:     Cervical back: Normal range of motion and neck supple.     Right lower leg: No edema.     Left lower leg: No edema.  Skin:    General: Skin is warm and dry.     Findings: No rash.  Neurological:     General: No focal deficit present.     Mental Status: She is alert. Mental status is at baseline.     Motor: No weakness.  Psychiatric:        Mood and Affect: Mood normal.     ED Results / Procedures / Treatments   Labs (all labs ordered are listed, but only abnormal results are displayed) Labs Reviewed  URINALYSIS, ROUTINE W REFLEX MICROSCOPIC - Abnormal; Notable for the following components:      Result Value   APPearance HAZY (*)    All other components within normal limits   LIPASE, BLOOD  COMPREHENSIVE METABOLIC PANEL  CBC  I-STAT BETA HCG BLOOD, ED ([facility], [facility], [facility] ONLY)    EKG None  Radiology CT Renal Stone Study  Result Date: 05/24/2020 CLINICAL DATA:  30 year old female with left flank pain radiating to the left pubis. History of renal stones.  EXAM: CT ABDOMEN AND PELVIS WITHOUT CONTRAST TECHNIQUE: Multidetector CT imaging of the abdomen and pelvis was performed following the standard protocol without IV contrast. COMPARISON:  CT Abdomen and Pelvis 03/26/2017. FINDINGS: Lower chest: Negative. Hepatobiliary: Negative noncontrast liver. Contracted, negative gallbladder. Pancreas: Negative noncontrast pancreas. Spleen: Negative. Adrenals/Urinary Tract: Normal adrenal glands. Noncontrast left kidney appears normal. No nephrolithiasis or hydronephrosis. No perinephric stranding. Noncontrast right kidney may be slightly indistinct when compared to the left on series 3, image 32. Punctate right intrarenal calculi. However, there is no convincing periureteral stranding or hydroureter. No convincing right perinephric stranding. No calculus is identified along the course of either ureter. There are bilateral lower pelvic phleboliths which are unchanged from 2018. Decompressed urinary bladder. No calculus identified within the bladder. Stomach/Bowel: Increased retained stool in redundant large bowel when compared to 2018. Normal retrocecal appendix (series 3, image 44). No large bowel inflammation. No dilated small bowel loops. Noncontrast stomach and duodenum appear negative. No free air. No free fluid identified in the abdomen. Vascular/Lymphatic: Normal caliber abdominal aorta. No calcified atherosclerosis. Vascular patency is not evaluated in the absence of IV contrast. Reproductive: Negative noncontrast appearance. Other: Small volume pelvic free fluid with low to intermediate fluid density. Musculoskeletal: Stable, negative. IMPRESSION: 1. Punctate right nephrolithiasis but no  convincing obstructive uropathy. Noncontrast left kidney and ureter appear normal. 2. Small volume pelvic free fluid appears mildly complex. Although nonspecific this might be trace hemoperitoneum such as from a hemorrhagic ovarian cyst. 3. Otherwise negative noncontrast CT Abdomen and Pelvis; - normal appendix. - increased retained stool throughout redundant large bowel when compared to 2018. Electronically Signed   By: Odessa FlemingH  Hall M.D.   On: 05/24/2020 09:33    Procedures Procedures   Medications Ordered in ED Medications - No data to display  ED Course  I have reviewed the triage vital signs and the nursing notes.  Pertinent labs & imaging results that were available during my care of the patient were reviewed by me and considered in my medical decision making (see chart for details).  Patient seen and examined. Work-up initiated.  She is currently in no distress.  Patient states that she is 95% sure that the pain she is having is due to kidney stones.  It is unclear to me the actual source of her pain.  Will obtain CT renal to evaluate for possibility of stone.  Will recheck labs and urine test.  She looks well and will likely require primary care follow-up.  Vital signs reviewed and are as follows: BP (!) 134/110 (BP Location: Right Arm)   Pulse 96   Temp 98.4 F (36.9 C)   Resp 16   SpO2 100%   9:47 AM patient updated on results.  We discussed possible etiologies of pain.  Encouraged PCP and OB/GYN follow-up.  Referrals given.  Rx meloxicam for pain.   The patient was urged to return to the Emergency Department immediately with worsening of current symptoms, worsening abdominal pain, persistent vomiting, blood noted in stools, fever, or any other concerns. The patient verbalized understanding.     MDM Rules/Calculators/A&P                          Patient presents with ongoing episodes of flank pain as well as amenorrhea for the past 2 months.  Possible etiologies of pain include  passage of renal stones, ruptured ovarian cyst.  Laboratory work-up here today was reassuring without any serious abnormalities.  CT imaging, noncontrast, she has a small punctate stone in the right kidney as well as a small amount of pelvic fluid.  No emergent or dangerous causes of patient's symptoms identified.  She would benefit from outpatient follow-up.  Referrals as above.  She is not pregnant.  I do not suspect ovarian torsion, appendicitis, UTI pyelonephritis, tubo-ovarian abscess, PID as cause of patient's symptoms today.  No dangerous or life-threatening conditions suspected or identified by history, physical exam, and by work-up. No indications for hospitalization identified.     Final Clinical Impression(s) / ED Diagnoses Final diagnoses:  Flank pain  Irregular periods/menstrual cycles    Rx / DC Orders ED Discharge Orders         Ordered    meloxicam (MOBIC) 15 MG tablet  Daily PRN        05/24/20 0944           Renne Crigler, PA-C 05/24/20 0950    Jacalyn Lefevre, MD 05/24/20 1004

## 2020-07-27 ENCOUNTER — Other Ambulatory Visit: Payer: Self-pay

## 2020-07-27 ENCOUNTER — Emergency Department (HOSPITAL_BASED_OUTPATIENT_CLINIC_OR_DEPARTMENT_OTHER)
Admission: EM | Admit: 2020-07-27 | Discharge: 2020-07-27 | Disposition: A | Discharge status: Home / Self Care | Payer: Medicaid Other | Attending: Emergency Medicine | Admitting: Emergency Medicine

## 2020-07-27 ENCOUNTER — Encounter (HOSPITAL_BASED_OUTPATIENT_CLINIC_OR_DEPARTMENT_OTHER): Payer: Self-pay

## 2020-07-27 DIAGNOSIS — R21 Rash and other nonspecific skin eruption: Secondary | ICD-10-CM | POA: Diagnosis not present

## 2020-07-27 DIAGNOSIS — F17210 Nicotine dependence, cigarettes, uncomplicated: Secondary | ICD-10-CM | POA: Diagnosis not present

## 2020-07-27 MED ORDER — PREDNISONE 50 MG PO TABS
60.0000 mg | ORAL_TABLET | Freq: Once | ORAL | Status: AC
  Administered 2020-07-27: 60 mg via ORAL
  Filled 2020-07-27: qty 1

## 2020-07-27 MED ORDER — KETOROLAC TROMETHAMINE 30 MG/ML IJ SOLN
60.0000 mg | Freq: Once | INTRAMUSCULAR | Status: AC
  Administered 2020-07-27: 60 mg via INTRAMUSCULAR
  Filled 2020-07-27: qty 2

## 2020-07-27 MED ORDER — PREDNISONE 10 MG PO TABS: 20.0000 mg | ORAL_TABLET | Freq: Every day | ORAL | 0 refills | Status: AC

## 2020-07-27 MED ORDER — FAMOTIDINE 20 MG PO TABS
20.0000 mg | ORAL_TABLET | Freq: Once | ORAL | Status: AC
  Administered 2020-07-27: 20 mg via ORAL
  Filled 2020-07-27: qty 1

## 2020-07-27 NOTE — ED Notes (Signed)
Tasia Catchings from [facility] called per patient request for pick up upon discharge, states on his way

## 2020-07-27 NOTE — Discharge Instructions (Signed)
Please pick up prescription and take as prescribed. You can continue taking Benadryl daily for your rash. Please refrain from using roommate's makeup as this may be what you are reacting to.   It is also recommended that you be allowed to wear a cloth mask at this time to help avoid irritation to your skin.   Return to the ED for any worsening symptoms.

## 2020-07-27 NOTE — ED Notes (Signed)
Patient verbalizes understanding of discharge instructions. Opportunity for questioning and answers were provided. Armband removed by staff, pt discharged from ED.  

## 2020-07-27 NOTE — ED Triage Notes (Signed)
Reports hives under neck starting 4 days ago. Has been taking benadryl. Is unsure if there has been a change in detergents or soaps

## 2020-07-27 NOTE — ED Provider Notes (Signed)
[facility] EMERGENCY DEPARTMENT Provider Note   CSN: [id number] Arrival date & time: 07/27/20  2841     History Chief Complaint  Patient presents with  . Rash    Patty Mata is a 30 y.o. female who presents to the ED today with complaint of rash to her neck and underneath her chin for the past 4 days.  Patient reports that she has been in a [facility] recovery center for the last 3 weeks.  She is not having any issues with her detergent or other hygiene products until 4 days ago.  She does not believe she has been on any new medications when the rash started.  She did start taking other medications yesterday at [facility] however has taken them in the past without issue.  Upon further questioning she does remember that she used her roommates make up on her neck however she did not think that that could be the cause.  She does report it is itchy at times and then other times that she just irritative.  She does feel like her throat is swelling at times however none currently. She has been taking Benadryl with mild relief. Last took this AM. Denies shortness of breath, wheezing, abdominal pain, nausea, vomiting.  She is also requesting something for her headache at this time.  She states she gets headaches quite frequently and this feels similar.  She states the only thing she can take at the [facility] recovery center is Tylenol and it does not seem to work.  She reports ibuprofen also does not work however Toradol injection does seem to work for her headache.  Past surgical history of tubal ligation.  Denies risk of pregnancy.   The history is provided by the patient and medical records.       Past Medical History:  Diagnosis Date  . Accidental drug overdose   . Anxiety   . Depression   . History of kidney stones   . Kidney stones   . Pyelonephritis     Patient Active Problem List   Diagnosis Date Noted  . Substance induced mood disorder (HCC) 10/31/2019  . Severe recurrent  major depression without psychotic features (HCC) 10/26/2019  . Alcohol-induced mood disorder (HCC)   . Cholestasis 03/03/2018  . Elevated liver function tests 02/19/2018  . No leakage of amniotic fluid into vagina 01/07/2018  . Rubella non-immune status, antepartum 11/04/2017  . Frequent UTI 10/30/2017  . Anxiety and depression 10/30/2017    Past Surgical History:  Procedure Laterality Date  . STENT PLACEMENT NON-VASCULAR ([facility] HX)     renal  . TUBAL LIGATION Bilateral 03/06/2018   Procedure: POST PARTUM TUBAL LIGATION;  Surgeon: Lesly Dukes, MD;  Location: [facility] BIRTHING SUITES;  Service: Gynecology;  Laterality: Bilateral;     OB History    Gravida  2   Para  2   Term  2   Preterm      AB      Living  2     SAB      IAB      Ectopic      Multiple  0   Live Births  2           Family History  Problem Relation Age of Onset  . Depression Mother   . Arthritis Mother   . Asthma Father   . Mental illness Father   . Cancer Maternal Grandfather     Social History   Tobacco Use  .  Smoking status: Current Every Day Smoker    Packs/day: 1.00    Years: 5.00    Pack years: 5.00    Types: Cigarettes  . Smokeless tobacco: Never Used  Vaping Use  . Vaping Use: Never used  Substance Use Topics  . Alcohol use: Not Currently  . Drug use: Not Currently    Comment: Xanax    Home Medications Prior to Admission medications   Medication Sig Start Date End Date Taking? Authorizing Provider  predniSONE (DELTASONE) 10 MG tablet Take 2 tablets (20 mg total) by mouth daily for 4 days. 07/27/20 07/31/20 Yes Darald Uzzle, PA-C  meloxicam (MOBIC) 15 MG tablet Take 1 tablet (15 mg total) by mouth daily as needed for pain. 05/24/20   Renne Crigler, PA-C  ondansetron (ZOFRAN ODT) 4 MG disintegrating tablet Take 1 tablet (4 mg total) by mouth every 8 (eight) hours as needed for nausea or vomiting. 05/09/20   Gailen Shelter, PA  amLODipine (NORVASC) 5 MG tablet Take 1  tablet (5 mg total) by mouth daily. Patient not taking: Reported on 12/05/2018 03/07/18 07/14/19  Tilda Burrow, MD  sertraline (ZOLOFT) 25 MG tablet Take 1 tablet (25 mg total) by mouth daily for 7 days. 11/01/19 12/14/19  Antonieta Pert, MD    Allergies    Penicillins  Review of Systems   Review of Systems  Constitutional: Negative for chills and fever.  Eyes: Negative for visual disturbance.  Skin: Positive for rash.  Neurological: Positive for headaches. Negative for dizziness, weakness, light-headedness and numbness.  All other systems reviewed and are negative.   Physical Exam Updated Vital Signs BP 133/87   Pulse 100   Temp 97.9 F (36.6 C) (Oral)   Resp 17   Ht 5\' 2"  (1.575 m)   Wt 56.7 kg   LMP 05/29/2020   SpO2 100%   BMI 22.86 kg/m   Physical Exam Vitals and nursing note reviewed.  Constitutional:      Appearance: She is not ill-appearing or diaphoretic.  HENT:     Head: Normocephalic and atraumatic.  Eyes:     Conjunctiva/sclera: Conjunctivae normal.  Cardiovascular:     Rate and Rhythm: Normal rate and regular rhythm.     Pulses: Normal pulses.  Pulmonary:     Effort: Pulmonary effort is normal.     Breath sounds: Normal breath sounds. No wheezing, rhonchi or rales.  Abdominal:     Palpations: Abdomen is soft.     Tenderness: There is no abdominal tenderness.  Musculoskeletal:     Cervical back: Neck supple.  Skin:    General: Skin is warm and dry.     Findings: Rash present.     Comments: Contact dermatitis noted to anterior neck and submandibular area   Neurological:     General: No focal deficit present.     Mental Status: She is alert and oriented to person, place, and time.     ED Results / Procedures / Treatments   Labs (all labs ordered are listed, but only abnormal results are displayed) Labs Reviewed - No data to display  EKG None  Radiology No results found.  Procedures Procedures   Medications Ordered in ED Medications   ketorolac (TORADOL) 30 MG/ML injection 60 mg (60 mg Intramuscular Given 07/27/20 1056)  famotidine (PEPCID) tablet 20 mg (20 mg Oral Given 07/27/20 1056)  predniSONE (DELTASONE) tablet 60 mg (60 mg Oral Given 07/27/20 1055)    ED Course  I have reviewed the  triage vital signs and the nursing notes.  Pertinent labs & imaging results that were available during my care of the patient were reviewed by me and considered in my medical decision making (see chart for details).    MDM Rules/Calculators/A&P                          30 year old female presenting to the ED today with complaints of rash to her neck for the past 4 days. Also complaining of throat swelling however none currently. Has been taking Benadryl at her [facility] recovery center without relief.  She initially denied any new foods, detergents, hygiene products however upon further questioning she does mention that she has been using her roommates make up.  It does appear that the rash appears consistent with contact dermatitis most pacifically in her neck area which is where she has been applying make-up.  No rash to other areas of the body including torso, arms, legs, hands, soles.  Oropharynx clear.  No wheezing.  We will provide Pepcid and prednisone at this time.  Patient is also requesting something for her headache, has been taking Tylenol at [facility] without relief.  History of headaches, states this feels similar to previous.  No other symptoms at this time.  Will provide Toradol injection and reevaluate.    On reevaluation pt resting comfortably; talking on the phone. Reports improvement in her HA. Will discharge at this time. Pt mentions she feels like the mask she has to wear at [facility] is also irritating her skin; pt instructed to wear cloth mask to help. Will prescribe prednisone burst to help with reaction. Advised to continue benadryl and avoid roommates makeup. Stable for discharge at this time.   This note was prepared using  Dragon voice recognition software and may include unintentional dictation errors due to the inherent limitations of voice recognition software.  Final Clinical Impression(s) / ED Diagnoses Final diagnoses:  Rash and nonspecific skin eruption    Rx / DC Orders ED Discharge Orders         Ordered    predniSONE (DELTASONE) 10 MG tablet  Daily        07/27/20 1136           Discharge Instructions     Please pick up prescription and take as prescribed. You can continue taking Benadryl daily for your rash. Please refrain from using roommate's makeup as this may be what you are reacting to.   It is also recommended that you be allowed to wear a cloth mask at this time to help avoid irritation to your skin.   Return to the ED for any worsening symptoms.         Tanda Rockers, PA-C 07/27/20 1137    Tegeler, Canary Brim, MD 07/27/20 [phone]

## 2020-08-10 ENCOUNTER — Telehealth (HOSPITAL_COMMUNITY): Payer: Medicaid Other | Admitting: Psychiatry

## 2020-08-30 ENCOUNTER — Other Ambulatory Visit: Payer: Self-pay

## 2020-08-30 ENCOUNTER — Emergency Department (HOSPITAL_COMMUNITY)
Admission: EM | Admit: 2020-08-30 | Discharge: 2020-08-30 | Disposition: A | Discharge status: Home / Self Care | Payer: Medicaid Other | Attending: Emergency Medicine | Admitting: Emergency Medicine

## 2020-08-30 DIAGNOSIS — R509 Fever, unspecified: Secondary | ICD-10-CM | POA: Diagnosis present

## 2020-08-30 DIAGNOSIS — Z28310 Unvaccinated for covid-19: Secondary | ICD-10-CM | POA: Diagnosis not present

## 2020-08-30 DIAGNOSIS — F17210 Nicotine dependence, cigarettes, uncomplicated: Secondary | ICD-10-CM | POA: Diagnosis not present

## 2020-08-30 DIAGNOSIS — U071 COVID-19: Secondary | ICD-10-CM | POA: Diagnosis not present

## 2020-08-30 LAB — RESP PANEL BY RT-PCR (FLU A&B, COVID) ARPGX2
Influenza A by PCR: NEGATIVE
Influenza B by PCR: NEGATIVE
SARS Coronavirus 2 by RT PCR: POSITIVE — AB

## 2020-08-30 LAB — COMPREHENSIVE METABOLIC PANEL
ALT: 64 U/L — ABNORMAL HIGH (ref 0–44)
AST: 93 U/L — ABNORMAL HIGH (ref 15–41)
Albumin: 3.8 g/dL (ref 3.5–5.0)
Alkaline Phosphatase: 50 U/L (ref 38–126)
Anion gap: 5 (ref 5–15)
BUN: 10 mg/dL (ref 6–20)
CO2: 29 mmol/L (ref 22–32)
Calcium: 8.7 mg/dL — ABNORMAL LOW (ref 8.9–10.3)
Chloride: 101 mmol/L (ref 98–111)
Creatinine, Ser: 0.64 mg/dL (ref 0.44–1.00)
GFR, Estimated: >60 mL/min (ref >60)
Glucose, Bld: 109 mg/dL — ABNORMAL HIGH (ref 70–99)
Potassium: 4 mmol/L (ref 3.5–5.1)
Sodium: 135 mmol/L (ref 135–145)
Total Bilirubin: 0.4 mg/dL (ref 0.3–1.2)
Total Protein: 6.7 g/dL (ref 6.5–8.1)

## 2020-08-30 LAB — CBC WITH DIFFERENTIAL/PLATELET
Abs Immature Granulocytes: 0.01 10*3/uL (ref 0.00–0.07)
Basophils Absolute: 0 10*3/uL (ref 0.0–0.1)
Basophils Relative: 0 %
Eosinophils Absolute: 0 10*3/uL (ref 0.0–0.5)
Eosinophils Relative: 1 %
HCT: 38.4 % (ref 36.0–46.0)
Hemoglobin: 12.3 g/dL (ref 12.0–15.0)
Immature Granulocytes: 0 %
Lymphocytes Relative: 33 %
Lymphs Abs: 1.3 10*3/uL (ref 0.7–4.0)
MCH: 31.1 pg (ref 26.0–34.0)
MCHC: 32 g/dL (ref 30.0–36.0)
MCV: 97.2 fL (ref 80.0–100.0)
Monocytes Absolute: 0.3 10*3/uL (ref 0.1–1.0)
Monocytes Relative: 8 %
Neutro Abs: 2.3 10*3/uL (ref 1.7–7.7)
Neutrophils Relative %: 58 %
Platelets: 129 10*3/uL — ABNORMAL LOW (ref 150–400)
RBC: 3.95 MIL/uL (ref 3.87–5.11)
RDW: 13 % (ref 11.5–15.5)
WBC: 4 10*3/uL (ref 4.0–10.5)
nRBC: 0 % (ref 0.0–0.2)

## 2020-08-30 LAB — URINALYSIS, ROUTINE W REFLEX MICROSCOPIC
Bilirubin Urine: NEGATIVE
Glucose, UA: NEGATIVE mg/dL
Hgb urine dipstick: NEGATIVE
Ketones, ur: 5 mg/dL — AB
Leukocytes,Ua: NEGATIVE
Nitrite: NEGATIVE
Protein, ur: 30 mg/dL — AB
Specific Gravity, Urine: 1.025 (ref 1.005–1.030)
pH: 5 (ref 5.0–8.0)

## 2020-08-30 LAB — I-STAT BETA HCG BLOOD, ED (MC, WL, AP ONLY): I-stat hCG, quantitative: <5 m[IU]/mL (ref <5)

## 2020-08-30 LAB — LIPASE, BLOOD: Lipase: 22 U/L (ref 11–51)

## 2020-08-30 MED ORDER — DEXAMETHASONE SODIUM PHOSPHATE 10 MG/ML IJ SOLN
8.0000 mg | Freq: Once | INTRAMUSCULAR | Status: AC
  Administered 2020-08-30: 8 mg via INTRAVENOUS
  Filled 2020-08-30: qty 1

## 2020-08-30 MED ORDER — SODIUM CHLORIDE 0.9 % IV BOLUS
1000.0000 mL | Freq: Once | INTRAVENOUS | Status: AC
  Administered 2020-08-30: 1000 mL via INTRAVENOUS

## 2020-08-30 MED ORDER — ONDANSETRON HCL 4 MG/2ML IJ SOLN
4.0000 mg | Freq: Once | INTRAMUSCULAR | Status: AC
  Administered 2020-08-30: 4 mg via INTRAVENOUS
  Filled 2020-08-30: qty 2

## 2020-08-30 MED ORDER — ONDANSETRON 4 MG PO TBDP: 4.0000 mg | ORAL_TABLET | Freq: Three times a day (TID) | ORAL | 0 refills | Status: DC | PRN

## 2020-08-30 MED ORDER — SODIUM CHLORIDE 0.9 % IV BOLUS
500.0000 mL | Freq: Once | INTRAVENOUS | Status: AC
  Administered 2020-08-30: 500 mL via INTRAVENOUS

## 2020-08-30 MED ORDER — ACETAMINOPHEN 325 MG PO TABS
650.0000 mg | ORAL_TABLET | Freq: Once | ORAL | Status: AC
  Administered 2020-08-30: 650 mg via ORAL
  Filled 2020-08-30: qty 2

## 2020-08-30 NOTE — ED Triage Notes (Signed)
Pt reports generalized body aches, cough, n/v/d since being dx with covid on Friday.

## 2020-08-30 NOTE — ED Provider Notes (Signed)
Emergency Medicine Provider Triage Evaluation Note  Patty Mata , a 30 y.o. female  was evaluated in triage.  Pt complains of covid sxs.  Review of Systems  Positive: Fever, headache, sore throat, cough, n/v/v, myalgia Negative: Sob, dysuria  Physical Exam  There were no vitals taken for this visit. Gen:   Awake, no distress   Resp:  Normal effort  MSK:   Moves extremities without difficulty  Other:    Medical Decision Making  Medically screening exam initiated at 3:00 PM.  Appropriate orders placed.  Patty Mata was informed that the remainder of the evaluation will be completed by another provider, this initial triage assessment does not replace that evaluation, and the importance of remaining in the ED until their evaluation is complete.  Pt here with sxs suggestive of covid, have not been vaccinated.     Fayrene Helper, PA-C 08/30/20 1505    Milagros Loll, MD 08/31/20 [phone]

## 2020-08-30 NOTE — ED Provider Notes (Signed)
[facility]   CSN: [id number] Arrival date & time: 08/30/20  1431     History No chief complaint on file.   Patty Mata is a 30 y.o. female.  HPI Patient is a 30 year old female with a medical history as noted below.  She presents to the emergency department due to intermittent fevers, headache, sore throat, cough, nausea, vomiting, diarrhea, body aches.  Symptoms all started about 5 days ago.  She states she had a positive home COVID test.  She states her nausea, vomiting, diarrhea have all persisted which is why she came to the emergency department today.  She states she has vomited 1 time today.  Nonbloody nonbilious.  States her diarrhea is watery brown.  No hematochezia.  She has not been vaccinated for COVID-19.  Denies any chest pain, shortness of breath, abdominal pain, or urinary complaints.    Past Medical History:  Diagnosis Date  . Accidental drug overdose   . Anxiety   . Depression   . History of kidney stones   . Kidney stones   . Pyelonephritis     Patient Active Problem List   Diagnosis Date Noted  . Substance induced mood disorder (HCC) 10/31/2019  . Severe recurrent major depression without psychotic features (HCC) 10/26/2019  . Alcohol-induced mood disorder (HCC)   . Cholestasis 03/03/2018  . Elevated liver function tests 02/19/2018  . No leakage of amniotic fluid into vagina 01/07/2018  . Rubella non-immune status, antepartum 11/04/2017  . Frequent UTI 10/30/2017  . Anxiety and depression 10/30/2017    Past Surgical History:  Procedure Laterality Date  . STENT PLACEMENT NON-VASCULAR ([facility] HX)     renal  . TUBAL LIGATION Bilateral 03/06/2018   Procedure: POST PARTUM TUBAL LIGATION;  Surgeon: Lesly DukesLeggett, Kelly H, MD;  [facility];  Service: Gynecology;  Laterality: Bilateral;     OB History    Gravida  2   Para  2   Term  2   Preterm      AB      Living  2     SAB       IAB      Ectopic      Multiple  0   Live Births  2           Family History  Problem Relation Age of Onset  . Depression Mother   . Arthritis Mother   . Asthma Father   . Mental illness Father   . Cancer Maternal Grandfather     Social History   Tobacco Use  . Smoking status: Current Every Day Smoker    Packs/day: 1.00    Years: 5.00    Pack years: 5.00    Types: Cigarettes  . Smokeless tobacco: Never Used  Vaping Use  . Vaping Use: Never used  Substance Use Topics  . Alcohol use: Not Currently  . Drug use: Not Currently    Comment: Xanax    Home Medications Prior to Admission medications   Medication Sig Start Date End Date Taking? Authorizing Provider  ondansetron (ZOFRAN ODT) 4 MG disintegrating tablet Take 1 tablet (4 mg total) by mouth every 8 (eight) hours as needed for nausea or vomiting. 08/30/20  Yes Placido SouJoldersma, Ronit Cranfield, PA-C  meloxicam (MOBIC) 15 MG tablet Take 1 tablet (15 mg total) by mouth daily as needed for pain. 05/24/20   Renne CriglerGeiple, Joshua, PA-C  amLODipine (NORVASC) 5 MG tablet Take 1 tablet (5 mg  total) by mouth daily. Patient not taking: Reported on 12/05/2018 03/07/18 07/14/19  Tilda Burrow, MD  sertraline (ZOLOFT) 25 MG tablet Take 1 tablet (25 mg total) by mouth daily for 7 days. 11/01/19 12/14/19  Antonieta Pert, MD    Allergies    Penicillins  Review of Systems   Review of Systems  All other systems reviewed and are negative. Ten systems reviewed and are negative for acute change, except as noted in the HPI.   Physical Exam Updated Vital Signs BP 101/64   Pulse 83   Temp 98.1 F (36.7 C) (Oral)   Resp 15   SpO2 96%   Physical Exam Vitals and nursing note reviewed.  Constitutional:      General: She is not in acute distress.    Appearance: Normal appearance. She is normal weight. She is not ill-appearing, toxic-appearing or diaphoretic.  HENT:     Head: Normocephalic and atraumatic.     Right Ear: External ear normal.     Left  Ear: External ear normal.     Nose: Nose normal.     Mouth/Throat:     Mouth: Mucous membranes are moist.     Pharynx: Oropharynx is clear. No oropharyngeal exudate or posterior oropharyngeal erythema.     Comments: Uvula midline.  No significant erythema noted in the posterior oropharynx.  No exudates.  Readily handling secretions. Eyes:     General: No scleral icterus.       Right eye: No discharge.        Left eye: No discharge.     Extraocular Movements: Extraocular movements intact.     Conjunctiva/sclera: Conjunctivae normal.  Cardiovascular:     Rate and Rhythm: Normal rate and regular rhythm.     Pulses: Normal pulses.     Heart sounds: Normal heart sounds. No murmur heard. No friction rub. No gallop.      Comments: Regular rate and rhythm without murmurs, rubs, or gallops. Pulmonary:     Effort: Pulmonary effort is normal. No respiratory distress.     Breath sounds: Normal breath sounds. No stridor. No wheezing, rhonchi or rales.     Comments: Lungs are clear to auscultation bilaterally. Abdominal:     General: Abdomen is flat.     Palpations: Abdomen is soft.     Tenderness: There is no abdominal tenderness.     Comments: Abdomen is flat, soft, and nontender with deep palpation in all 4 quadrants.  Musculoskeletal:        General: Normal range of motion.     Cervical back: Normal range of motion and neck supple. No tenderness.  Skin:    General: Skin is warm and dry.  Neurological:     General: No focal deficit present.     Mental Status: She is alert and oriented to person, place, and time.  Psychiatric:        Mood and Affect: Mood normal.        Behavior: Behavior normal.    ED Results / Procedures / Treatments   Labs (all labs ordered are listed, but only abnormal results are displayed) Labs Reviewed  RESP PANEL BY RT-PCR (FLU A&B, COVID) ARPGX2 - Abnormal; Notable for the following components:      Result Value   SARS Coronavirus 2 by RT PCR POSITIVE (*)     All other components within normal limits  CBC WITH DIFFERENTIAL/PLATELET - Abnormal; Notable for the following components:   Platelets 129 (*)  All other components within normal limits  COMPREHENSIVE METABOLIC PANEL - Abnormal; Notable for the following components:   Glucose, Bld 109 (*)    Calcium 8.7 (*)    AST 93 (*)    ALT 64 (*)    All other components within normal limits  URINALYSIS, ROUTINE W REFLEX MICROSCOPIC - Abnormal; Notable for the following components:   Color, Urine AMBER (*)    APPearance HAZY (*)    Ketones, ur 5 (*)    Protein, ur 30 (*)    Bacteria, UA RARE (*)    All other components within normal limits  LIPASE, BLOOD  I-STAT BETA HCG BLOOD, ED ([facility], [facility], [facility] ONLY)    EKG None  Radiology No results found.  Procedures Procedures   Medications Ordered in ED Medications  sodium chloride 0.9 % bolus 1,000 mL (0 mLs Intravenous Stopped 08/30/20 2011)  ondansetron (ZOFRAN) injection 4 mg (4 mg Intravenous Given 08/30/20 1914)  acetaminophen (TYLENOL) tablet 650 mg (650 mg Oral Given 08/30/20 1917)  dexamethasone (DECADRON) injection 8 mg (8 mg Intravenous Given 08/30/20 1911)  sodium chloride 0.9 % bolus 500 mL (0 mLs Intravenous Stopped 08/30/20 2214)    ED Course  I have reviewed the triage vital signs and the nursing notes.  Pertinent labs & imaging results that were available during my care of the patient were reviewed by me and considered in my medical decision making (see chart for details).  Clinical Course as of 08/30/20 2245  Wed Aug 30, 2020  2102 SARS Coronavirus 2 by RT PCR(!): POSITIVE [LJ]    Clinical Course User Index [LJ] Placido Sou, PA-C   MDM Rules/Calculators/A&P                          Pt is a 30 y.o. female who presents to the ED d/t sx related to COVID-19.  Labs: CBC with platelets of 129. CMP with glucose of 109, calcium of 8.7, AST of 93, ALT of 64. UA with 5 ketones, 30 protein, rare bacteria. I-STAT beta-hCG less  than 5. Respiratory panel is positive for COVID-19.  I, Placido Sou, PA-C, personally reviewed and evaluated these images and lab results as part of my medical decision-making.  Patient treated with IV fluids, Zofran, Tylenol, as well as Decadron.  She was reassessed and states she is feeling much better.  Now sleeping comfortably in bed.  Tolerating p.o. without difficulty.  Lab work is mostly reassuring.  Mild hypocalcemia but otherwise no electrolyte abnormalities.  CBC without leukocytosis or neutrophilia.  Lipase within normal limits.  I-STAT beta-hCG less than 5.  Feel the patient is stable for discharge at this time and she is agreeable.  Discussed OTC medications for management of her various symptoms.  Will discharge on a course of Zofran for breakthrough nausea and vomiting.  We discussed return precautions in length.  She verbalized understanding of the above plan.  Her questions were answered and she was amicable at the time of discharge.  Note: Portions of this report may have been transcribed using voice recognition software. Every effort was made to ensure accuracy; however, inadvertent computerized transcription errors may be present.   Final Clinical Impression(s) / ED Diagnoses Final diagnoses:  COVID-19   Rx / DC Orders ED Discharge Orders         Ordered    ondansetron (ZOFRAN ODT) 4 MG disintegrating tablet  Every 8 hours PRN  08/30/20 2240           Placido Sou, PA-C 08/30/20 2245    Benjiman Core, MD 08/30/20 2325

## 2020-08-30 NOTE — Discharge Instructions (Signed)
You have tested positive for COVID-19 today.  Please continue to quarantine for 10 to 14 days from onset of your symptoms.  I would recommend taking Tylenol for management of your body aches and fevers.  I prescribed you a medication called Zofran.  This is a nausea medication that you can take up to 3 times a day for extreme nausea and vomiting that you cannot control.  Only take it as prescribed.  If you develop worsening symptoms please come back to the emergency department for reevaluation.  It was a pleasure to meet you.

## 2020-08-30 NOTE — ED Notes (Signed)
Pt states she took a covid test 5 days prior, and it was positive. Pt states she has had diarrhea, nausea, and vomiting for the 5 days.

## 2021-03-17 ENCOUNTER — Emergency Department (HOSPITAL_COMMUNITY)
Admission: EM | Admit: 2021-03-17 | Discharge: 2021-03-17 | Disposition: A | Discharge status: Home / Self Care | Payer: Medicaid Other | Attending: Emergency Medicine | Admitting: Emergency Medicine

## 2021-03-17 ENCOUNTER — Other Ambulatory Visit: Payer: Self-pay

## 2021-03-17 ENCOUNTER — Encounter (HOSPITAL_COMMUNITY): Payer: Self-pay | Admitting: Emergency Medicine

## 2021-03-17 DIAGNOSIS — U071 COVID-19: Secondary | ICD-10-CM | POA: Diagnosis not present

## 2021-03-17 DIAGNOSIS — Z8616 Personal history of COVID-19: Secondary | ICD-10-CM | POA: Diagnosis not present

## 2021-03-17 DIAGNOSIS — F17210 Nicotine dependence, cigarettes, uncomplicated: Secondary | ICD-10-CM | POA: Diagnosis not present

## 2021-03-17 DIAGNOSIS — R059 Cough, unspecified: Secondary | ICD-10-CM | POA: Diagnosis present

## 2021-03-17 DIAGNOSIS — R319 Hematuria, unspecified: Secondary | ICD-10-CM | POA: Diagnosis not present

## 2021-03-17 LAB — CBC
HCT: 36.5 % (ref 36.0–46.0)
Hemoglobin: 11.6 g/dL — ABNORMAL LOW (ref 12.0–15.0)
MCH: 31.9 pg (ref 26.0–34.0)
MCHC: 31.8 g/dL (ref 30.0–36.0)
MCV: 100.3 fL — ABNORMAL HIGH (ref 80.0–100.0)
Platelets: 158 10*3/uL (ref 150–400)
RBC: 3.64 MIL/uL — ABNORMAL LOW (ref 3.87–5.11)
RDW: 13.4 % (ref 11.5–15.5)
WBC: 4.5 10*3/uL (ref 4.0–10.5)
nRBC: 0 % (ref 0.0–0.2)

## 2021-03-17 LAB — URINALYSIS, MICROSCOPIC (REFLEX)

## 2021-03-17 LAB — COMPREHENSIVE METABOLIC PANEL
ALT: 17 U/L (ref 0–44)
AST: 19 U/L (ref 15–41)
Albumin: 3.4 g/dL — ABNORMAL LOW (ref 3.5–5.0)
Alkaline Phosphatase: 52 U/L (ref 38–126)
Anion gap: 6 (ref 5–15)
BUN: 11 mg/dL (ref 6–20)
CO2: 26 mmol/L (ref 22–32)
Calcium: 8.8 mg/dL — ABNORMAL LOW (ref 8.9–10.3)
Chloride: 105 mmol/L (ref 98–111)
Creatinine, Ser: 0.82 mg/dL (ref 0.44–1.00)
GFR, Estimated: >60 mL/min (ref >60)
Glucose, Bld: 122 mg/dL — ABNORMAL HIGH (ref 70–99)
Potassium: 3.9 mmol/L (ref 3.5–5.1)
Sodium: 137 mmol/L (ref 135–145)
Total Bilirubin: 0.4 mg/dL (ref 0.3–1.2)
Total Protein: 6.6 g/dL (ref 6.5–8.1)

## 2021-03-17 LAB — RESP PANEL BY RT-PCR (FLU A&B, COVID) ARPGX2
Influenza A by PCR: NEGATIVE
Influenza B by PCR: NEGATIVE
SARS Coronavirus 2 by RT PCR: POSITIVE — AB

## 2021-03-17 LAB — URINALYSIS, ROUTINE W REFLEX MICROSCOPIC
Glucose, UA: NEGATIVE mg/dL
Ketones, ur: 15 mg/dL — AB
Leukocytes,Ua: NEGATIVE
Nitrite: NEGATIVE
Protein, ur: NEGATIVE mg/dL
Specific Gravity, Urine: >1.03 — ABNORMAL HIGH (ref 1.005–1.030)
pH: 5.5 (ref 5.0–8.0)

## 2021-03-17 LAB — PREGNANCY, URINE: Preg Test, Ur: NEGATIVE

## 2021-03-17 MED ORDER — IBUPROFEN 400 MG PO TABS: 600.0000 mg | ORAL_TABLET | Freq: Once | ORAL | Status: DC

## 2021-03-17 NOTE — ED Provider Notes (Signed)
[facility] EMERGENCY DEPARTMENT Provider Note   CSN: [id number] Arrival date & time: 03/17/21  [id number]     History No chief complaint on file.   Patty Mata is a 30 y.o. female.  HPI Patient is a 30 year old female with past medical history significant for depression, anxiety, kidney stones  Patient presented to the ER today with multiple complaints  Primarily she is endorsing swelling to all 4 extremities.  She states that it is currently resolved but seems to been intermittent for the past 1 week.  Seems a worse in the mornings.  She also is endorsing some occasional right flank pain.  She states that she had an episode of right lower abdominal pain yesterday as well.  She states it was sharp and stabbing it felt somewhat similar to kidney stone pain she has had in the past.  She does also endorse a cough over the past 2 weeks with some fatigue and malaise.  She states that she has been around sick family members recently 65 of whom tested positive for COVID-19 she states she took a rapid test that was negative.  Denies any chest pain, fevers, chills, nausea, vomiting.  Has taken no medications prior to arrival.  No other associate symptoms.    Past Medical History:  Diagnosis Date   Accidental drug overdose    Anxiety    Depression    History of kidney stones    Kidney stones    Pyelonephritis     Patient Active Problem List   Diagnosis Date Noted   Substance induced mood disorder ([place]) 10/31/2019   Severe recurrent major depression without psychotic features ([place]) 10/26/2019   Alcohol-induced mood disorder (HCC)    Cholestasis 03/03/2018   Elevated liver function tests 02/19/2018   No leakage of amniotic fluid into vagina 01/07/2018   Rubella non-immune status, antepartum 11/04/2017   Frequent UTI 10/30/2017   Anxiety and depression 10/30/2017    Past Surgical History:  Procedure Laterality Date   STENT PLACEMENT NON-VASCULAR ([place] HX)      renal   TUBAL LIGATION Bilateral 03/06/2018   Procedure: POST PARTUM TUBAL LIGATION;  Surgeon: Guss Bunde, MD;  Location: [place];  Service: Gynecology;  Laterality: Bilateral;     OB History     Gravida  2   Para  2   Term  2   Preterm      AB      Living  2      SAB      IAB      Ectopic      Multiple  0   Live Births  2           Family History  Problem Relation Age of Onset   Depression Mother    Arthritis Mother    Asthma Father    Mental illness Father    Cancer Maternal Grandfather     Social History   Tobacco Use   Smoking status: Every Day    Packs/day: 1.00    Years: 5.00    Pack years: 5.00    Types: Cigarettes   Smokeless tobacco: Never  Vaping Use   Vaping Use: Never used  Substance Use Topics   Alcohol use: Not Currently   Drug use: Not Currently    Comment: Xanax    Home Medications Prior to Admission medications   Medication Sig Start Date End Date Taking? Authorizing Provider  meloxicam (MOBIC) 15 MG tablet  Take 1 tablet (15 mg total) by mouth daily as needed for pain. 05/24/20   Carlisle Cater, PA-C  ondansetron (ZOFRAN ODT) 4 MG disintegrating tablet Take 1 tablet (4 mg total) by mouth every 8 (eight) hours as needed for nausea or vomiting. 08/30/20   Rayna Sexton, PA-C  amLODipine (NORVASC) 5 MG tablet Take 1 tablet (5 mg total) by mouth daily. Patient not taking: Reported on 12/05/2018 03/07/18 07/14/19  Jonnie Kind, MD  sertraline (ZOLOFT) 25 MG tablet Take 1 tablet (25 mg total) by mouth daily for 7 days. 11/01/19 12/14/19  Sharma Covert, MD    Allergies    Penicillins  Review of Systems   Review of Systems  Constitutional:  Positive for fatigue. Negative for chills and fever.  HENT:  Negative for congestion.   Eyes:  Negative for pain.  Respiratory:  Negative for cough and shortness of breath.   Cardiovascular:  Negative for chest pain and leg swelling.       BL leg and arm swelling   Gastrointestinal:  Negative for abdominal pain and vomiting.  Genitourinary:  Positive for flank pain. Negative for dysuria.  Musculoskeletal:  Negative for myalgias.  Skin:  Negative for rash.  Neurological:  Negative for dizziness and headaches.   Physical Exam Updated Vital Signs BP 129/74 (BP Location: Left Arm)    Pulse 84    Temp 98.2 F (36.8 C) (Oral)    Resp 16    LMP 02/15/2021    SpO2 99%   Physical Exam Vitals and nursing note reviewed.  Constitutional:      General: She is not in acute distress.    Comments: Pleasant well-appearing 30 year old.  In no acute distress.  Sitting comfortably in bed.  Able answer questions appropriately follow commands. No increased work of breathing. Speaking in full sentences.   HENT:     Head: Normocephalic and atraumatic.     Nose: Nose normal.     Mouth/Throat:     Mouth: Mucous membranes are dry.  Eyes:     General: No scleral icterus. Cardiovascular:     Rate and Rhythm: Normal rate and regular rhythm.     Pulses: Normal pulses.     Heart sounds: Normal heart sounds.     Comments: Bilateral radial artery and dorsalis pedis pulses 3+ and symmetric Pulmonary:     Effort: Pulmonary effort is normal. No respiratory distress.     Breath sounds: Normal breath sounds. No wheezing.  Abdominal:     Palpations: Abdomen is soft.     Tenderness: There is no abdominal tenderness. There is no guarding or rebound.     Comments: No CVA tenderness  Musculoskeletal:     Cervical back: Normal range of motion.     Right lower leg: No edema.     Left lower leg: No edema.     Comments: No lower extremity swelling or edema.  No calf tenderness.  No bruising  Skin:    General: Skin is warm and dry.     Capillary Refill: Capillary refill takes less than 2 seconds.  Neurological:     Mental Status: She is alert. Mental status is at baseline.  Psychiatric:        Mood and Affect: Mood normal.        Behavior: Behavior normal.    ED Results /  Procedures / Treatments   Labs (all labs ordered are listed, but only abnormal results are displayed) Labs Reviewed  RESP PANEL  BY RT-PCR (FLU A&B, COVID) ARPGX2 - Abnormal; Notable for the following components:      Result Value   SARS Coronavirus 2 by RT PCR POSITIVE (*)    All other components within normal limits  CBC - Abnormal; Notable for the following components:   RBC 3.64 (*)    Hemoglobin 11.6 (*)    MCV 100.3 (*)    All other components within normal limits  COMPREHENSIVE METABOLIC PANEL - Abnormal; Notable for the following components:   Glucose, Bld 122 (*)    Calcium 8.8 (*)    Albumin 3.4 (*)    All other components within normal limits  URINALYSIS, ROUTINE W REFLEX MICROSCOPIC - Abnormal; Notable for the following components:   Specific Gravity, Urine >1.030 (*)    Hgb urine dipstick LARGE (*)    Bilirubin Urine SMALL (*)    Ketones, ur 15 (*)    All other components within normal limits  URINALYSIS, MICROSCOPIC (REFLEX) - Abnormal; Notable for the following components:   Bacteria, UA RARE (*)    All other components within normal limits  PREGNANCY, URINE    EKG None  Radiology No results found.  Procedures Procedures   Medications Ordered in ED Medications - No data to display  ED Course  I have reviewed the triage vital signs and the nursing notes.  Pertinent labs & imaging results that were available during my care of the patient were reviewed by me and considered in my medical decision making (see chart for details).    MDM Rules/Calculators/A&P                          Patient is presented to the emergency room today with multiple complaints  Seems she has had some cough congestion fatigue and myalgias this is been going on for 2 weeks.  She denies any shortness of breath chest pain syncope or near syncope.  She states that she has been around a COVID-positive individual but tested negative on a rapid test earlier this week.  She also is  complaining of some intermittent flank pain seems to be left-sided although she has had some episodes on the right as well.  She has not noticed any hematuria.  No urinary frequency urgency or dysuria.  No other associated symptoms.  She has had kidney stones in the past.  She states this feels somewhat similar but much less severe.  She states that she has not been eating or drinking as much she has been more fatigued and tired over the past 2 weeks and has had less interest in eating or drinking.  Patient found to be positive for COVID-19.  She is well outside of the window for paxlovid will manage conservatively.  Urinalysis with increased specific gravity consistent with some dehydration.  Patient with hemoglobin and urine not consistent with infection.  Has some symptoms consistent with nephrolithiasis and has a history of this.  CMP with normal kidney function and unremarkable.  CBC without leukocytosis or clinically significant anemia.  Pregnancy test negative.  Patient will follow up with primary care.  She does not have any evidence of limb swelling today and she states that it resolved before she got to the ER.  With normal kidney function and no crackles on exam no JVD relatively low suspicion for cardiac cause of her lower extremity edema she also has upper extremity edema.  Also doubt bilateral upper and lower extremity DVTs as her  symptoms seem to wax and wane throughout the day.  This may be related to her current COVID-19 infection.  Return precautions were given.  She will follow-up with primary care.  Patient tolerated p.o. fluids and heart rate improved from 95 to 80s  Patient agreeable to plan.  Discharged home with information for [facility] wellness clinic.  Final Clinical Impression(s) / ED Diagnoses Final diagnoses:  COVID-19 virus infection  Hematuria, unspecified type    Rx / DC Orders ED Discharge Orders     None        Tedd Sias, [place] 03/18/21  [id number]    Pattricia Boss, MD 03/19/21 [phone]

## 2021-03-17 NOTE — Discharge Instructions (Addendum)
It was our pleasure to provide your ER care today - we hope that you feel better.  Your covid test is positive - see attached information.   Please drink plenty of water you appear dehydrated today and your labs/urine indicate this as well.  Hydration can help prevent future kidney stone formation  Please follow-up with the [facility] and wellness clinic.  I have given you information for their office.  Call Monday to make an appointment.  Return to ER if worse, new symptoms, increased trouble breathing, new/severe pain, or other concern.   Your kidney function was normal.  Please use Tylenol or ibuprofen for pain.  You may use 600 mg ibuprofen every 6 hours or 1000 mg of Tylenol every 6 hours.  You may choose to alternate between the 2.  This would be most effective.  Not to exceed 4 g of Tylenol within 24 hours.  Not to exceed 3200 mg ibuprofen 24 hours.   You do have blood in your urine. Please have this re-checked when you follow up with the primary care clinic.

## 2021-03-17 NOTE — ED Triage Notes (Signed)
Pt reports swelling to all extremities x 1 week.  Reports URI last week.  C/o lower back pain and R hip pain.  Denies SOB, n/v, fever, chills, and urinary complaints.

## 2021-03-17 NOTE — ED Provider Notes (Signed)
Emergency Medicine Provider Triage Evaluation Note  Patty Mata , a 30 y.o. female  was evaluated in triage.  Pt complains of bilateral upper and lower extremity edema. She states that this started earlier this week, and that she was sick with URI symptoms last week. Also endorses numbness/tingling in bilateral upper and lower extremities with some pain in her right hip and leg. Denies any injuries or recent overuse. Denies any fevers, chills, shortness of breath, nausea, vomiting, diarrhea, dysuria, hematuria. Denies any cardiac history or risk factors.  Review of Systems  Positive:  Negative: See above  Physical Exam  BP 113/87 (BP Location: Right Arm)    Pulse (!) 108    Temp 98 F (36.7 C) (Oral)    Resp 18    SpO2 98%  Gen:   Awake, no distress   Resp:  Normal effort  MSK:   Moves extremities without difficulty  Other:  Minimal swelling noted to bilateral upper and lower extremities  Medical Decision Making  Medically screening exam initiated at 9:21 AM.  Appropriate orders placed.  Patty Mata was informed that the remainder of the evaluation will be completed by another provider, this initial triage assessment does not replace that evaluation, and the importance of remaining in the ED until their evaluation is complete.     Vear Clock 03/17/21 3762    Margarita Grizzle, MD 03/19/21 [phone]

## 2021-03-17 NOTE — ED Notes (Signed)
Reviewed discharge instructions with patient. Follow-up care reviewed. Patient verbalized understanding. Patient A&Ox4, VSS, and ambulatory with steady gait upon discharge.  

## 2021-09-13 ENCOUNTER — Emergency Department (HOSPITAL_COMMUNITY)
Admission: EM | Admit: 2021-09-13 | Discharge: 2021-09-13 | Discharge status: Against Medical Advice | Payer: Medicaid Other | Attending: Emergency Medicine | Admitting: Emergency Medicine

## 2021-09-13 ENCOUNTER — Other Ambulatory Visit: Payer: Self-pay

## 2021-09-13 ENCOUNTER — Encounter (HOSPITAL_COMMUNITY): Payer: Self-pay

## 2021-09-13 DIAGNOSIS — R600 Localized edema: Secondary | ICD-10-CM | POA: Insufficient documentation

## 2021-09-13 DIAGNOSIS — Z5321 Procedure and treatment not carried out due to patient leaving prior to being seen by health care provider: Secondary | ICD-10-CM | POA: Diagnosis not present

## 2021-09-13 DIAGNOSIS — R1031 Right lower quadrant pain: Secondary | ICD-10-CM | POA: Diagnosis not present

## 2021-09-13 DIAGNOSIS — R112 Nausea with vomiting, unspecified: Secondary | ICD-10-CM | POA: Diagnosis not present

## 2021-09-13 LAB — COMPREHENSIVE METABOLIC PANEL
ALT: 17 U/L (ref 0–44)
AST: 20 U/L (ref 15–41)
Albumin: 4.1 g/dL (ref 3.5–5.0)
Alkaline Phosphatase: 62 U/L (ref 38–126)
Anion gap: 8 (ref 5–15)
BUN: 15 mg/dL (ref 6–20)
CO2: 28 mmol/L (ref 22–32)
Calcium: 8.9 mg/dL (ref 8.9–10.3)
Chloride: 97 mmol/L — ABNORMAL LOW (ref 98–111)
Creatinine, Ser: 0.64 mg/dL (ref 0.44–1.00)
GFR, Estimated: >60 mL/min (ref >60)
Glucose, Bld: 85 mg/dL (ref 70–99)
Potassium: 3.8 mmol/L (ref 3.5–5.1)
Sodium: 133 mmol/L — ABNORMAL LOW (ref 135–145)
Total Bilirubin: 0.6 mg/dL (ref 0.3–1.2)
Total Protein: 6.8 g/dL (ref 6.5–8.1)

## 2021-09-13 LAB — CBC
HCT: 36.2 % (ref 36.0–46.0)
Hemoglobin: 11.7 g/dL — ABNORMAL LOW (ref 12.0–15.0)
MCH: 31 pg (ref 26.0–34.0)
MCHC: 32.3 g/dL (ref 30.0–36.0)
MCV: 95.8 fL (ref 80.0–100.0)
Platelets: 193 10*3/uL (ref 150–400)
RBC: 3.78 MIL/uL — ABNORMAL LOW (ref 3.87–5.11)
RDW: 12.9 % (ref 11.5–15.5)
WBC: 8.9 10*3/uL (ref 4.0–10.5)
nRBC: 0 % (ref 0.0–0.2)

## 2021-09-13 LAB — URINALYSIS, ROUTINE W REFLEX MICROSCOPIC
Bacteria, UA: NONE SEEN
Bilirubin Urine: NEGATIVE
Glucose, UA: NEGATIVE mg/dL
Hgb urine dipstick: NEGATIVE
Ketones, ur: 5 mg/dL — AB
Nitrite: NEGATIVE
Protein, ur: NEGATIVE mg/dL
Specific Gravity, Urine: 1.024 (ref 1.005–1.030)
pH: 6 (ref 5.0–8.0)

## 2021-09-13 LAB — I-STAT BETA HCG BLOOD, ED (MC, WL, AP ONLY): I-stat hCG, quantitative: <5 m[IU]/mL (ref <5)

## 2021-09-13 LAB — LIPASE, BLOOD: Lipase: 22 U/L (ref 11–51)

## 2021-09-13 NOTE — ED Triage Notes (Signed)
Pt c/o hand and feet swelling intermittently for past 2 months.  Pt states it is worse when she has to urinate.  Pt c/o right flank pain.  Pt has had nausea/vomiting.

## 2021-09-24 ENCOUNTER — Telehealth: Payer: Medicaid Other | Admitting: Physician Assistant

## 2021-09-24 DIAGNOSIS — M7989 Other specified soft tissue disorders: Secondary | ICD-10-CM | POA: Diagnosis not present

## 2021-09-24 MED ORDER — HYDROCHLOROTHIAZIDE 25 MG PO TABS: 25.0000 mg | ORAL_TABLET | Freq: Every day | ORAL | 0 refills | Status: DC | PRN

## 2021-10-16 ENCOUNTER — Telehealth: Payer: Medicaid Other | Admitting: Physician Assistant

## 2021-10-16 DIAGNOSIS — Z91199 Patient's noncompliance with other medical treatment and regimen due to unspecified reason: Secondary | ICD-10-CM

## 2021-10-16 NOTE — Progress Notes (Signed)
The patient no-showed for appointment despite this provider sending direct link, reaching out via phone with no response and waiting for at least 10 minutes from appointment time for patient to join. They will be marked as a NS for this appointment/time.  ? ?Lucie Friedlander Cody Vi Biddinger, PA-C ? ? ? ?

## 2022-01-03 ENCOUNTER — Emergency Department (HOSPITAL_COMMUNITY)
Admission: EM | Admit: 2022-01-03 | Discharge: 2022-01-03 | Discharge status: Against Medical Advice | Payer: Medicaid Other | Attending: Student | Admitting: Student

## 2022-01-03 ENCOUNTER — Encounter (HOSPITAL_COMMUNITY): Payer: Self-pay

## 2022-01-03 DIAGNOSIS — R112 Nausea with vomiting, unspecified: Secondary | ICD-10-CM | POA: Diagnosis not present

## 2022-01-03 DIAGNOSIS — Y909 Presence of alcohol in blood, level not specified: Secondary | ICD-10-CM | POA: Insufficient documentation

## 2022-01-03 DIAGNOSIS — F10120 Alcohol abuse with intoxication, uncomplicated: Secondary | ICD-10-CM | POA: Insufficient documentation

## 2022-01-03 DIAGNOSIS — R197 Diarrhea, unspecified: Secondary | ICD-10-CM | POA: Diagnosis not present

## 2022-01-03 DIAGNOSIS — M7918 Myalgia, other site: Secondary | ICD-10-CM | POA: Insufficient documentation

## 2022-01-03 DIAGNOSIS — Z5321 Procedure and treatment not carried out due to patient leaving prior to being seen by health care provider: Secondary | ICD-10-CM | POA: Diagnosis not present

## 2022-01-03 NOTE — ED Provider Triage Note (Signed)
Emergency Medicine Provider Triage Evaluation Note  Patty Mata , a 31 y.o. female  was evaluated in triage.  Pt complains of drug intoxication.  Reports that she snorted fentanyl last night around midnight and then took Suboxone a few hours ago and now feels sick.  Nausea, vomiting, diarrhea and body aches.  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  Appropriate orders placed.  Patty Mata was informed that the remainder of the evaluation will be completed by another provider, this initial triage assessment does not replace that evaluation, and the importance of remaining in the ED until their evaluation is complete.     Rhae Hammock, PA-C 01/03/22 1842

## 2022-01-03 NOTE — ED Triage Notes (Signed)
Pt presents with c/o withdrawal of snorted fentanyl last night, approx 1/2 gram. Pt reports that after taking this, she took suboxone and is now experiencing headache and body aches. Pt is alert and oriented. Denies SI.

## 2022-01-10 ENCOUNTER — Telehealth: Payer: Medicaid Other | Admitting: Family

## 2022-01-10 DIAGNOSIS — R051 Acute cough: Secondary | ICD-10-CM | POA: Diagnosis not present

## 2022-01-10 DIAGNOSIS — B9689 Other specified bacterial agents as the cause of diseases classified elsewhere: Secondary | ICD-10-CM | POA: Diagnosis not present

## 2022-01-10 DIAGNOSIS — J208 Acute bronchitis due to other specified organisms: Secondary | ICD-10-CM | POA: Diagnosis not present

## 2022-01-10 MED ORDER — DOXYCYCLINE HYCLATE 100 MG PO TABS: 100.0000 mg | ORAL_TABLET | Freq: Two times a day (BID) | ORAL | 0 refills | Status: DC

## 2022-01-10 NOTE — Progress Notes (Signed)
Virtual Visit Consent   Patty Mata, you are scheduled for a virtual visit with a [facility] provider today. Just as with appointments in the office, your consent must be obtained to participate. Your consent will be active for this visit and any virtual visit you may have with one of our providers in the next 365 days. If you have a MyChart account, a copy of this consent can be sent to you electronically.  As this is a virtual visit, video technology does not allow for your provider to perform a traditional examination. This may limit your provider's ability to fully assess your condition. If your provider identifies any concerns that need to be evaluated in person or the need to arrange testing (such as labs, EKG, etc.), we will make arrangements to do so. Although advances in technology are sophisticated, we cannot ensure that it will always work on either your end or our end. If the connection with a video visit is poor, the visit may have to be switched to a telephone visit. With either a video or telephone visit, we are not always able to ensure that we have a secure connection.  By engaging in this virtual visit, you consent to the provision of healthcare and authorize for your insurance to be billed (if applicable) for the services provided during this visit. Depending on your insurance coverage, you may receive a charge related to this service.  I need to obtain your verbal consent now. Are you willing to proceed with your visit today? Patty Mata has provided verbal consent on 01/10/2022 for a virtual visit (video or telephone). Evelina Dun, FNP  Date: 01/10/2022 7:35 PM  Virtual Visit via Video Note   I, Evelina Dun, connected with  Patty Mata  ([id number], [birth year]) on 01/10/22 at  7:30 PM EDT by a video-enabled telemedicine application and verified that I am speaking with the correct person using two identifiers.  Location: Patient: Virtual Visit Location Patient: Other:  outside Provider: Virtual Visit Location Provider: Home Office   I discussed the limitations of evaluation and management by telemedicine and the availability of in person appointments. The patient expressed understanding and agreed to proceed.    History of Present Illness: Patty Mata is a 31 y.o. who identifies as a female who was assigned female at birth, and is being seen today for cough that started over a week.  HPI: Cough This is a new problem. The current episode started 1 to 4 weeks ago. The problem has been gradually worsening. The problem occurs every few minutes. The cough is Productive of sputum. Associated symptoms include nasal congestion, shortness of breath and wheezing. Pertinent negatives include no chills, ear congestion, ear pain, fever, headaches, myalgias or postnasal drip. She has tried rest and OTC cough suppressant for the symptoms. The treatment provided mild relief.    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   Alcohol-induced mood disorder ([place])    Cholestasis 03/03/2018   Elevated liver function tests 02/19/2018   No leakage of amniotic fluid into vagina 01/07/2018   Rubella non-immune status, antepartum 11/04/2017   Frequent UTI 10/30/2017   Anxiety and depression 10/30/2017    Allergies:  Allergies  Allergen Reactions   Penicillins Rash    Has patient had a PCN reaction causing immediate rash, facial/tongue/throat swelling, SOB or lightheadedness with hypotension:  Yes Has patient had a PCN reaction causing severe rash involving mucus membranes or skin necrosis: No Has patient had a PCN reaction that required hospitalization: No Has patient had a PCN reaction occurring within the last 10 years: No If all of the above answers are "NO", then may proceed with Cephalosporin use.    Medications:   Current Outpatient Medications:    doxycycline (VIBRA-TABS) 100 MG tablet, Take 1 tablet (100 mg total) by mouth 2 (two) times daily., Disp: 20 tablet, Rfl: 0  Observations/Objective: Patient is well-developed, well-nourished in no acute distress.  Resting comfortably  at home.  Head is normocephalic, atraumatic.  No labored breathing.  Speech is clear and coherent with logical content.  Patient is alert and oriented at baseline.  Nasal congestion  Assessment and Plan: 1. Acute cough - doxycycline (VIBRA-TABS) 100 MG tablet; Take 1 tablet (100 mg total) by mouth 2 (two) times daily.  Dispense: 20 tablet; Refill: 0  2. Acute bacterial bronchitis - doxycycline (VIBRA-TABS) 100 MG tablet; Take 1 tablet (100 mg total) by mouth 2 (two) times daily.  Dispense: 20 tablet; Refill: 0  - Take meds as prescribed - Use a cool mist humidifier  -Use saline nose sprays frequently -Force fluids -For any cough or congestion  Use plain Mucinex- regular strength or max strength is fine -For fever or aces or pains- take tylenol or ibuprofen. -Throat lozenges if help  Follow Up Instructions: I discussed the assessment and treatment plan with the patient. The patient was provided an opportunity to ask questions and all were answered. The patient agreed with the plan and demonstrated an understanding of the instructions.  A copy of instructions were sent to the patient via MyChart unless otherwise noted below.     The patient was advised to call back or seek an in-person evaluation if the symptoms worsen or if the condition fails to improve as anticipated.  Time:  I spent 13 minutes with the patient via telehealth technology discussing the above problems/concerns.    Jannifer Rodney, FNP

## 2022-01-11 ENCOUNTER — Telehealth: Payer: Medicaid Other | Admitting: Physician Assistant

## 2022-01-11 ENCOUNTER — Telehealth: Payer: Medicaid Other | Admitting: Family Medicine

## 2022-01-11 DIAGNOSIS — R051 Acute cough: Secondary | ICD-10-CM

## 2022-01-11 DIAGNOSIS — B9689 Other specified bacterial agents as the cause of diseases classified elsewhere: Secondary | ICD-10-CM | POA: Diagnosis not present

## 2022-01-11 DIAGNOSIS — J208 Acute bronchitis due to other specified organisms: Secondary | ICD-10-CM | POA: Diagnosis not present

## 2022-01-11 MED ORDER — DOXYCYCLINE HYCLATE 100 MG PO TABS: 100.0000 mg | ORAL_TABLET | Freq: Two times a day (BID) | ORAL | 0 refills | Status: DC

## 2022-01-11 MED ORDER — BENZONATATE 200 MG PO CAPS: 200.0000 mg | ORAL_CAPSULE | Freq: Two times a day (BID) | ORAL | 0 refills | Status: DC | PRN

## 2022-01-11 NOTE — Progress Notes (Signed)
The patient no-showed for appointment despite this provider sending direct link x 2 with no response and waiting for at least 10 minutes from appointment time for patient to join. They will be marked as a NS for this appointment/time.   Divina Neale M Leanny Moeckel, PA-C    

## 2022-01-11 NOTE — Progress Notes (Signed)
Virtual Visit Consent   Patty Mata, you are scheduled for a virtual visit with a [facility] provider today. Just as with appointments in the office, your consent must be obtained to participate. Your consent will be active for this visit and any virtual visit you may have with one of our providers in the next 365 days. If you have a MyChart account, a copy of this consent can be sent to you electronically.  As this is a virtual visit, video technology does not allow for your provider to perform a traditional examination. This may limit your provider's ability to fully assess your condition. If your provider identifies any concerns that need to be evaluated in person or the need to arrange testing (such as labs, EKG, etc.), we will make arrangements to do so. Although advances in technology are sophisticated, we cannot ensure that it will always work on either your end or our end. If the connection with a video visit is poor, the visit may have to be switched to a telephone visit. With either a video or telephone visit, we are not always able to ensure that we have a secure connection.  By engaging in this virtual visit, you consent to the provision of healthcare and authorize for your insurance to be billed (if applicable) for the services provided during this visit. Depending on your insurance coverage, you may receive a charge related to this service.  I need to obtain your verbal consent now. Are you willing to proceed with your visit today? Patty Mata has provided verbal consent on 01/11/2022 for a virtual visit (video or telephone). Patty Nims, FNP  Date: 01/11/2022 7:21 PM  Virtual Visit via Video Note   I, Patty Mata, connected with  Patty Mata  ([id number], 01-Apr-1991) on 01/11/22 at  7:15 PM EDT by a video-enabled telemedicine application and verified that I am speaking with the correct person using two identifiers.  Location: Patient: Virtual Visit Location Patient: Other:  pharmacy Provider: Virtual Visit Location Provider: Home Office   I discussed the limitations of evaluation and management by telemedicine and the availability of in person appointments. The patient expressed understanding and agreed to proceed.    History of Present Illness: Patty Mata is a 31 y.o. who identifies as a female who was assigned female at birth, and is being seen today for cough worsening. She is at the pharmacy to pick up her doxycycline sent yesterday by [facility] and they are closing. She would liked it sent to [facility] as they are open 24 hrs a day. She is also requesting a med to cough. No fever. In no distress. 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/26/2019   Alcohol-induced mood disorder ([place])    Cholestasis 03/03/2018   Elevated liver function tests 02/19/2018   No leakage of amniotic fluid into vagina 01/07/2018   Rubella non-immune status, antepartum 11/04/2017   Frequent UTI 10/30/2017   Anxiety and depression 10/30/2017    Allergies:  Allergies  Allergen Reactions   Penicillins Rash    Has patient had a PCN reaction causing immediate rash, facial/tongue/throat swelling, SOB or lightheadedness with hypotension: Yes Has patient had a PCN reaction causing severe rash involving mucus membranes or skin necrosis: No Has patient had a PCN reaction that required hospitalization: No Has patient had  a PCN reaction occurring within the last 10 years: No If all of the above answers are "NO", then may proceed with Cephalosporin use.    Medications:  Current Outpatient Medications:    benzonatate (TESSALON) 200 MG capsule, Take 1 capsule (200 mg total) by mouth 2 (two) times daily as needed for cough., Disp: 20 capsule, Rfl: 0   doxycycline (VIBRA-TABS) 100 MG tablet, Take 1 tablet (100 mg  total) by mouth 2 (two) times daily., Disp: 20 tablet, Rfl: 0  Observations/Objective: Patient is well-developed, well-nourished in no acute distress.  [facility] in no distress.   Head is normocephalic, atraumatic.  No labored breathing.  Speech is clear and coherent with logical content.  Patient is alert and oriented at baseline.    Assessment and Plan: 1. Acute bacterial bronchitis - doxycycline (VIBRA-TABS) 100 MG tablet; Take 1 tablet (100 mg total) by mouth 2 (two) times daily.  Dispense: 20 tablet; Refill: 0  2. Acute cough - doxycycline (VIBRA-TABS) 100 MG tablet; Take 1 tablet (100 mg total) by mouth 2 (two) times daily.  Dispense: 20 tablet; Refill: 0  Increase fluids, humidifier at night, med use and side effects discussed, urgent care if sx persist or worsen.   Follow Up Instructions: I discussed the assessment and treatment plan with the patient. The patient was provided an opportunity to ask questions and all were answered. The patient agreed with the plan and demonstrated an understanding of the instructions.  A copy of instructions were sent to the patient via MyChart unless otherwise noted below.     The patient was advised to call back or seek an in-person evaluation if the symptoms worsen or if the condition fails to improve as anticipated.  Time:  I spent 10 minutes with the patient via telehealth technology discussing the above problems/concerns.    Georgana Curio, FNP

## 2022-01-11 NOTE — Patient Instructions (Signed)

## 2022-01-12 ENCOUNTER — Telehealth: Payer: Medicaid Other | Admitting: Physician Assistant

## 2022-01-12 DIAGNOSIS — B9689 Other specified bacterial agents as the cause of diseases classified elsewhere: Secondary | ICD-10-CM | POA: Diagnosis not present

## 2022-01-12 DIAGNOSIS — J208 Acute bronchitis due to other specified organisms: Secondary | ICD-10-CM

## 2022-01-12 MED ORDER — PROMETHAZINE-DM 6.25-15 MG/5ML PO SYRP: 5.0000 mL | ORAL_SOLUTION | Freq: Four times a day (QID) | ORAL | 0 refills | Status: DC | PRN

## 2022-01-12 NOTE — Patient Instructions (Signed)
  Dawson Bills, thank you for joining Mar Daring, PA-C for today's virtual visit.  While this provider is not your primary care provider (PCP), if your PCP is located in our provider database this encounter information will be shared with them immediately following your visit.  [facility] account gives you access to today's visit and all your visits, tests, and labs performed at [facility] " click here if you don't have a [facility] account or go to [email or web address]  Consent: (Patient) Patty Mata provided verbal consent for this virtual visit at the beginning of the encounter.  Current Medications:  Current Outpatient Medications:    promethazine-dextromethorphan (PROMETHAZINE-DM) 6.25-15 MG/5ML syrup, Take 5 mLs by mouth 4 (four) times daily as needed., Disp: 118 mL, Rfl: 0   benzonatate (TESSALON) 200 MG capsule, Take 1 capsule (200 mg total) by mouth 2 (two) times daily as needed for cough., Disp: 20 capsule, Rfl: 0   doxycycline (VIBRA-TABS) 100 MG tablet, Take 1 tablet (100 mg total) by mouth 2 (two) times daily., Disp: 20 tablet, Rfl: 0   Medications ordered in this encounter:  Meds ordered this encounter  Medications   promethazine-dextromethorphan (PROMETHAZINE-DM) 6.25-15 MG/5ML syrup    Sig: Take 5 mLs by mouth 4 (four) times daily as needed.    Dispense:  118 mL    Refill:  0    Order Specific Question:   Supervising Provider    Answer:   Chase Picket [[id number]]     *If you need refills on other medications prior to your next appointment, please contact your pharmacy*  Follow-Up: Call back or seek an in-person evaluation if the symptoms worsen or if the condition fails to improve as anticipated.  [facility] [phone]   If you have been instructed to have an in-person evaluation today at a local Urgent Care facility, please use the link below. It will take you to a list of all of our available [facility], including address, phone number and hours of operation. Please do not delay care.  [facility]  If you or a family member do not have a primary care provider, use the link below to schedule a visit and establish care. When you choose a [facility] primary care physician or advanced practice provider, you gain a long-term partner in health. Find a Primary Care Provider  Learn more about [facility]'s in-office and virtual care options: [facility]

## 2022-01-12 NOTE — Progress Notes (Signed)
Virtual Visit Consent   Patty Mata, you are scheduled for a virtual visit with a [facility] provider today. Just as with appointments in the office, your consent must be obtained to participate. Your consent will be active for this visit and any virtual visit you may have with one of our providers in the next 365 days. If you have a MyChart account, a copy of this consent can be sent to you electronically.  As this is a virtual visit, video technology does not allow for your provider to perform a traditional examination. This may limit your provider's ability to fully assess your condition. If your provider identifies any concerns that need to be evaluated in person or the need to arrange testing (such as labs, EKG, etc.), we will make arrangements to do so. Although advances in technology are sophisticated, we cannot ensure that it will always work on either your end or our end. If the connection with a video visit is poor, the visit may have to be switched to a telephone visit. With either a video or telephone visit, we are not always able to ensure that we have a secure connection.  By engaging in this virtual visit, you consent to the provision of healthcare and authorize for your insurance to be billed (if applicable) for the services provided during this visit. Depending on your insurance coverage, you may receive a charge related to this service.  I need to obtain your verbal consent now. Are you willing to proceed with your visit today? Patty Mata has provided verbal consent on 01/12/2022 for a virtual visit (video or telephone). Margaretann Loveless, PA-C  Date: 01/12/2022 7:52 PM  Virtual Visit via Video Note   I, Margaretann Loveless, connected with  Patty Mata  ([id number], [birth year]) on 01/12/22 at  7:45 PM EDT by a video-enabled telemedicine application and verified that I am speaking with the correct person using two identifiers.  Location: Patient: Virtual Visit Location  Patient: Home Provider: Virtual Visit Location Provider: Home Office   I discussed the limitations of evaluation and management by telemedicine and the availability of in person appointments. The patient expressed understanding and agreed to proceed.    History of Present Illness: Patty Mata is a 31 y.o. who identifies as a female who was assigned female at birth, and is being seen today for needing a new cough medication called in. Was seen on 10/12 and 10/13 for acute bronchitis. Was started on Doxycycline. Then had tessalon called in, but this was not covered by her insurance and could not afford it. Asking for a change in treatment to more cost effective and needing a work note.   Problems:  Patient Active Problem List   Diagnosis Date Noted   Substance induced mood disorder (HCC) 10/31/2019   Severe recurrent major depression without psychotic features ([facility]) 10/26/2019   Alcohol-induced mood disorder ([facility])    Cholestasis 03/03/2018   Elevated liver function tests 02/19/2018   No leakage of amniotic fluid into vagina 01/07/2018   Rubella non-immune status, antepartum 11/04/2017   Frequent UTI 10/30/2017   Anxiety and depression 10/30/2017    Allergies:  Allergies  Allergen Reactions   Penicillins Rash    Has patient had a PCN reaction causing immediate rash, facial/tongue/throat swelling, SOB or lightheadedness with hypotension: Yes Has patient had a PCN reaction causing severe rash involving mucus membranes or skin necrosis: No Has patient had a PCN reaction that required hospitalization: No Has patient  had a PCN reaction occurring within the last 10 years: No If all of the above answers are "NO", then may proceed with Cephalosporin use.    Medications:  Current Outpatient Medications:    promethazine-dextromethorphan (PROMETHAZINE-DM) 6.25-15 MG/5ML syrup, Take 5 mLs by mouth 4 (four) times daily as needed., Disp: 118 mL, Rfl: 0   benzonatate (TESSALON) 200 MG capsule,  Take 1 capsule (200 mg total) by mouth 2 (two) times daily as needed for cough., Disp: 20 capsule, Rfl: 0   doxycycline (VIBRA-TABS) 100 MG tablet, Take 1 tablet (100 mg total) by mouth 2 (two) times daily., Disp: 20 tablet, Rfl: 0  Observations/Objective: Patient is well-developed, well-nourished in no acute distress.  Resting comfortably at home.  Head is normocephalic, atraumatic.  No labored breathing.  Speech is clear and coherent with logical content.  Patient is alert and oriented at baseline.    Assessment and Plan: 1. Acute bacterial bronchitis - promethazine-dextromethorphan (PROMETHAZINE-DM) 6.25-15 MG/5ML syrup; Take 5 mLs by mouth 4 (four) times daily as needed.  Dispense: 118 mL; Refill: 0  - Promethazine DM sent in instead as it is $10 on GoodRx; did advise medicaid does not cover any Rx cough suppressants, only OTC - Work note provided  Follow Up Instructions: I discussed the assessment and treatment plan with the patient. The patient was provided an opportunity to ask questions and all were answered. The patient agreed with the plan and demonstrated an understanding of the instructions.  A copy of instructions were sent to the patient via MyChart unless otherwise noted below.    The patient was advised to call back or seek an in-person evaluation if the symptoms worsen or if the condition fails to improve as anticipated.  Time:  I spent 8 minutes with the patient via telehealth technology discussing the above problems/concerns.    Mar Daring, PA-C

## 2022-01-12 NOTE — Progress Notes (Signed)
The patient no-showed for appointment despite this provider sending direct link x 2 with no response and waiting for at least 10 minutes from appointment time for patient to join. They will be marked as a NS for this appointment/time.   Paden Kuras M Jonea Bukowski, PA-C    

## 2022-01-16 ENCOUNTER — Telehealth: Payer: Medicaid Other

## 2022-03-23 ENCOUNTER — Telehealth: Payer: Medicaid Other | Admitting: Urgent Care

## 2022-03-23 DIAGNOSIS — R0981 Nasal congestion: Secondary | ICD-10-CM

## 2022-03-23 DIAGNOSIS — J3489 Other specified disorders of nose and nasal sinuses: Secondary | ICD-10-CM | POA: Diagnosis not present

## 2022-03-23 DIAGNOSIS — R051 Acute cough: Secondary | ICD-10-CM

## 2022-03-23 MED ORDER — FLUTICASONE PROPIONATE 50 MCG/ACT NA SUSP: 2.0000 | Freq: Every day | NASAL | 0 refills | Status: DC

## 2022-03-23 MED ORDER — PSEUDOEPH-BROMPHEN-DM 30-2-10 MG/5ML PO SYRP: 5.0000 mL | ORAL_SOLUTION | Freq: Four times a day (QID) | ORAL | 0 refills | Status: DC | PRN

## 2022-03-23 NOTE — Patient Instructions (Signed)
  [name], thank you for joining Maretta Bees, PA for today's virtual visit.  While this provider is not your primary care provider (PCP), if your PCP is located in our provider database this encounter information will be shared with them immediately following your visit.   A [facility] MyChart account gives you access to today's visit and all your visits, tests, and labs performed at [facility] " click here if you don't have a [facility] MyChart account or go to [email or web address]  Consent: (Patient) Patty Mata provided verbal consent for this virtual visit at the beginning of the encounter.  Current Medications:  Current Outpatient Medications:    brompheniramine-pseudoephedrine-DM 30-2-10 MG/5ML syrup, Take 5 mLs by mouth 4 (four) times daily as needed., Disp: 118 mL, Rfl: 0   fluticasone (FLONASE) 50 MCG/ACT nasal spray, Place 2 sprays into both nostrils daily., Disp: 16 g, Rfl: 0   Medications ordered in this encounter:  Meds ordered this encounter  Medications   brompheniramine-pseudoephedrine-DM 30-2-10 MG/5ML syrup    Sig: Take 5 mLs by mouth 4 (four) times daily as needed.    Dispense:  118 mL    Refill:  0    Order Specific Question:   Supervising Provider    Answer:   Merrilee Jansky [[id number]]   fluticasone (FLONASE) 50 MCG/ACT nasal spray    Sig: Place 2 sprays into both nostrils daily.    Dispense:  16 g    Refill:  0    Order Specific Question:   Supervising Provider    Answer:   Merrilee Jansky [id number]     *If you need refills on other medications prior to your next appointment, please contact your pharmacy*  Follow-Up: Call back or seek an in-person evaluation if the symptoms worsen or if the condition fails to improve as anticipated.  [facility] [phone]  Other Instructions Start taking the bromfed cough syrup up to four times daily as needed.  Continue plain mucinex with plenty of water. Use flonase  one spray each nostril twice daily to help with nasal congestion. If your symptoms persist or worsen, consider covid testing. Try to cut back / quit smoking as this is a risk factor for recurrent respiratory infections.   If you have been instructed to have an in-person evaluation today at a local Urgent Care facility, please use the link below. It will take you to a list of all of our available [facility], including address, phone number and hours of operation. Please do not delay care.  [facility]  If you or a family member do not have a primary care provider, use the link below to schedule a visit and establish care. When you choose a [facility] primary care physician or advanced practice provider, you gain a long-term partner in health. Find a Primary Care Provider  Learn more about [facility]'s in-office and virtual care options: [facility] - Get Care Now

## 2022-03-23 NOTE — Progress Notes (Signed)
Virtual Visit Consent   Patty Mata, you are scheduled for a virtual visit with a [facility] provider today. Just as with appointments in the office, your consent must be obtained to participate. Your consent will be active for this visit and any virtual visit you may have with one of our providers in the next 365 days. If you have a MyChart account, a copy of this consent can be sent to you electronically.  As this is a virtual visit, video technology does not allow for your provider to perform a traditional examination. This may limit your provider's ability to fully assess your condition. If your provider identifies any concerns that need to be evaluated in person or the need to arrange testing (such as labs, EKG, etc.), we will make arrangements to do so. Although advances in technology are sophisticated, we cannot ensure that it will always work on either your end or our end. If the connection with a video visit is poor, the visit may have to be switched to a telephone visit. With either a video or telephone visit, we are not always able to ensure that we have a secure connection.  By engaging in this virtual visit, you consent to the provision of healthcare and authorize for your insurance to be billed (if applicable) for the services provided during this visit. Depending on your insurance coverage, you may receive a charge related to this service.  I need to obtain your verbal consent now. Are you willing to proceed with your visit today? Patty Mata has provided verbal consent on 03/23/2022 for a virtual visit (video or telephone). Maretta Bees, PA  Date: 03/23/2022 3:30 PM  Virtual Visit via Video Note   I, Tallin Hart L Keishon Chavarin, connected with  Patty Mata  ([id number], 06/23/1990) on 03/23/22 at  3:15 PM EST by a video-enabled telemedicine application and verified that I am speaking with the correct person using two identifiers.  Location: Patient: Virtual Visit Location Patient:  Other: pt walking down the street outside Provider: Virtual Visit Location Provider: Home Office   I discussed the limitations of evaluation and management by telemedicine and the availability of in person appointments. The patient expressed understanding and agreed to proceed.    History of Present Illness: Patty Mata is a 31 y.o. who identifies as a female who was assigned female at birth, and is being seen today for congestion.  HPI: 31yo female presents today due to a deep, harsh cough and mucous in chest. Cough started last night. No fever. Does smoke cigarettes, denies any known hx of asthma or COPD. States she took one tablet of mucinex this morning without improvement to her congestion. Denies any known sick contact exposures, but does work in [employer]. Denies CP, SOB, palpitations or wheezing. Endorses clear rhinorrhea and sneezing. No sinus pain or ear pain. No additional symptoms.   Problems:  Patient Active Problem List   Diagnosis Date Noted   Substance induced mood disorder (HCC) 10/31/2019   Severe recurrent major depression without psychotic features (HCC) 10/26/2019   Alcohol-induced mood disorder (HCC)    Cholestasis 03/03/2018   Elevated liver function tests 02/19/2018   No leakage of amniotic fluid into vagina 01/07/2018   Rubella non-immune status, antepartum 11/04/2017   Frequent UTI 10/30/2017   Anxiety and depression 10/30/2017    Allergies:  Allergies  Allergen Reactions   Penicillins Rash    Has patient had a PCN reaction causing immediate rash, facial/tongue/throat swelling,  SOB or lightheadedness with hypotension: Yes Has patient had a PCN reaction causing severe rash involving mucus membranes or skin necrosis: No Has patient had a PCN reaction that required hospitalization: No Has patient had a PCN reaction occurring within the last 10 years: No If all of the above answers are "NO", then may proceed with Cephalosporin use.    Medications:   Current Outpatient Medications:    brompheniramine-pseudoephedrine-DM 30-2-10 MG/5ML syrup, Take 5 mLs by mouth 4 (four) times daily as needed., Disp: 118 mL, Rfl: 0   fluticasone (FLONASE) 50 MCG/ACT nasal spray, Place 2 sprays into both nostrils daily., Disp: 16 g, Rfl: 0  Observations/Objective: Patient is well-developed, well-nourished in no acute distress.  Pt is actively exerting herself, walking down the street. No coughing heard during encounter. Pt does not appear short of breath with exertion.  Head is normocephalic, atraumatic.  No labored breathing.  Speech is clear and coherent with logical content.  Patient is alert and oriented at baseline.    Assessment and Plan: 1. Nasal congestion with rhinorrhea  2. Acute cough  Pt with nasal congestion and cough x <24 hours. Supportive care appropriate as pt denies any red flag s/sx. Admits to having had something similar previously which resolved with cough medication. She requested a work note for today. Will start nasal spray as well. Encouraged smoking cessation.  Follow Up Instructions: I discussed the assessment and treatment plan with the patient. The patient was provided an opportunity to ask questions and all were answered. The patient agreed with the plan and demonstrated an understanding of the instructions.  A copy of instructions were sent to the patient via MyChart unless otherwise noted below.    The patient was advised to call back or seek an in-person evaluation if the symptoms worsen or if the condition fails to improve as anticipated.  Time:  I spent 8 minutes with the patient via telehealth technology discussing the above problems/concerns.    Persais Ethridge L Kayn Haymore, PA

## 2022-03-24 ENCOUNTER — Telehealth: Payer: Medicaid Other | Admitting: Family Medicine

## 2022-03-24 DIAGNOSIS — J684 Chronic respiratory conditions due to chemicals, gases, fumes and vapors: Secondary | ICD-10-CM | POA: Diagnosis not present

## 2022-03-24 MED ORDER — PREDNISONE 20 MG PO TABS: 20.0000 mg | ORAL_TABLET | Freq: Two times a day (BID) | ORAL | 0 refills | Status: AC

## 2022-03-24 NOTE — Patient Instructions (Signed)

## 2022-03-24 NOTE — Progress Notes (Signed)
Virtual Visit Consent   Patty Mata, you are scheduled for a virtual visit with a [facility] provider today. Just as with appointments in the office, your consent must be obtained to participate. Your consent will be active for this visit and any virtual visit you may have with one of our providers in the next 365 days. If you have a MyChart account, a copy of this consent can be sent to you electronically.  As this is a virtual visit, video technology does not allow for your provider to perform a traditional examination. This may limit your provider's ability to fully assess your condition. If your provider identifies any concerns that need to be evaluated in person or the need to arrange testing (such as labs, EKG, etc.), we will make arrangements to do so. Although advances in technology are sophisticated, we cannot ensure that it will always work on either your end or our end. If the connection with a video visit is poor, the visit may have to be switched to a telephone visit. With either a video or telephone visit, we are not always able to ensure that we have a secure connection.  By engaging in this virtual visit, you consent to the provision of healthcare and authorize for your insurance to be billed (if applicable) for the services provided during this visit. Depending on your insurance coverage, you may receive a charge related to this service.  I need to obtain your verbal consent now. Are you willing to proceed with your visit today? Patty Mata has provided verbal consent on 03/24/2022 for a virtual visit (video or telephone). Georgana Curio, FNP  Date: 03/24/2022 2:29 PM  Virtual Visit via Video Note   I, Georgana Curio, connected with  Patty Mata  ([id number], 1990/08/07) on 03/24/22 at  2:15 PM EST by a video-enabled telemedicine application and verified that I am speaking with the correct person using two identifiers.  Location: Patient: Virtual Visit Location Patient:  Home Provider: Virtual Visit Location Provider: Home Office   I discussed the limitations of evaluation and management by telemedicine and the availability of in person appointments. The patient expressed understanding and agreed to proceed.    History of Present Illness: Patty Mata is a 31 y.o. who identifies as a female who was assigned female at birth, and is being seen today for cough, fever and chills. No home covid test. No specific exposure however she works at [employer]. .Sx for 2 days. Seen yesterday with flonase and bromfed dm sent.   HPI: HPI  Problems:  Patient Active Problem List   Diagnosis Date Noted   Substance induced mood disorder (HCC) 10/31/2019   Severe recurrent major depression without psychotic features (HCC) 10/26/2019   Alcohol-induced mood disorder (HCC)    Cholestasis 03/03/2018   Elevated liver function tests 02/19/2018   No leakage of amniotic fluid into vagina 01/07/2018   Rubella non-immune status, antepartum 11/04/2017   Frequent UTI 10/30/2017   Anxiety and depression 10/30/2017    Allergies:  Allergies  Allergen Reactions   Penicillins Rash    Has patient had a PCN reaction causing immediate rash, facial/tongue/throat swelling, SOB or lightheadedness with hypotension: Yes Has patient had a PCN reaction causing severe rash involving mucus membranes or skin necrosis: No Has patient had a PCN reaction that required hospitalization: No Has patient had a PCN reaction occurring within the last 10 years: No If all of the above answers are "NO", then may proceed with  Cephalosporin use.    Medications:  Current Outpatient Medications:    predniSONE (DELTASONE) 20 MG tablet, Take 1 tablet (20 mg total) by mouth 2 (two) times daily with a meal for 5 days., Disp: 10 tablet, Rfl: 0   brompheniramine-pseudoephedrine-DM 30-2-10 MG/5ML syrup, Take 5 mLs by mouth 4 (four) times daily as needed., Disp: 118 mL, Rfl: 0   fluticasone (FLONASE) 50 MCG/ACT nasal  spray, Place 2 sprays into both nostrils daily., Disp: 16 g, Rfl: 0  Observations/Objective: Patient is well-developed, well-nourished in no acute distress.  Resting comfortably  at home.  Head is normocephalic, atraumatic.  No labored breathing.  Speech is clear and coherent with logical content.  Patient is alert and oriented at baseline.    Assessment and Plan: 1. 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   Follow Up Instructions: I discussed the assessment and treatment plan with the patient. The patient was provided an opportunity to ask questions and all were answered. The patient agreed with the plan and demonstrated an understanding of the instructions.  A copy of instructions were sent to the patient via MyChart unless otherwise noted below.   Patient has requested to receive PHI (AVS, Work Notes, etc) pertaining to this video visit through e-mail as they are currently without active MyChart. They have voiced understand that email is not considered secure and their health information could be viewed by someone other than the patient.   The patient was advised to call back or seek an in-person evaluation if the symptoms worsen or if the condition fails to improve as anticipated.  Time:  I spent 10 minutes with the patient via telehealth technology discussing the above problems/concerns.    Georgana Curio, FNP

## 2022-03-26 ENCOUNTER — Telehealth: Payer: Medicaid Other | Admitting: Family

## 2022-03-26 DIAGNOSIS — R6889 Other general symptoms and signs: Secondary | ICD-10-CM

## 2022-03-26 NOTE — Progress Notes (Signed)
Virtual Visit Consent   LAKEIDRA RELIFORD, you are scheduled for a virtual visit with a [facility] provider today. Just as with appointments in the office, your consent must be obtained to participate. Your consent will be active for this visit and any virtual visit you may have with one of our providers in the next 365 days. If you have a MyChart account, a copy of this consent can be sent to you electronically.  As this is a virtual visit, video technology does not allow for your provider to perform a traditional examination. This may limit your provider's ability to fully assess your condition. If your provider identifies any concerns that need to be evaluated in person or the need to arrange testing (such as labs, EKG, etc.), we will make arrangements to do so. Although advances in technology are sophisticated, we cannot ensure that it will always work on either your end or our end. If the connection with a video visit is poor, the visit may have to be switched to a telephone visit. With either a video or telephone visit, we are not always able to ensure that we have a secure connection.  By engaging in this virtual visit, you consent to the provision of healthcare and authorize for your insurance to be billed (if applicable) for the services provided during this visit. Depending on your insurance coverage, you may receive a charge related to this service.  I need to obtain your verbal consent now. Are you willing to proceed with your visit today? Patty Mata has provided verbal consent on 03/26/2022 for a virtual visit (video or telephone). Jannifer Rodney, FNP  Date: 03/26/2022 6:35 PM  Virtual Visit via Video Note   I, Jannifer Rodney, connected with  Patty Mata  ([id number], [birth year]) on 03/26/22 at  6:30 PM EST by a video-enabled telemedicine application and verified that I am speaking with the correct person using two identifiers.  Location: Patient: Virtual Visit Location Patient:  Home Provider: Virtual Visit Location Provider: Home Office   I discussed the limitations of evaluation and management by telemedicine and the availability of in person appointments. The patient expressed understanding and agreed to proceed.    History of Present Illness: Patty Mata is a 31 y.o. who identifies as a female who was assigned female at birth, and is being seen today for cough. She was seen on 03/24/23 and 03/24/22 and diagnosed with bronchitis. She was given prednisone but continues to have cough and not feeling well.   HPI: Cough This is a new problem. The current episode started in the past 7 days. The problem has been gradually worsening. The problem occurs every few minutes. The cough is Productive of sputum. Associated symptoms include chills, ear congestion, a fever, myalgias, nasal congestion, postnasal drip, shortness of breath and wheezing. Pertinent negatives include no ear pain or headaches. Risk factors for lung disease include smoking/tobacco exposure. She has tried rest for the symptoms. The treatment provided mild relief.    Problems:  Patient Active Problem List   Diagnosis Date Noted   Substance induced mood disorder (HCC) 10/31/2019   Severe recurrent major depression without psychotic features (HCC) 10/26/2019   Alcohol-induced mood disorder (HCC)    Cholestasis 03/03/2018   Elevated liver function tests 02/19/2018   No leakage of amniotic fluid into vagina 01/07/2018   Rubella non-immune status, antepartum 11/04/2017   Frequent UTI 10/30/2017   Anxiety and depression 10/30/2017    Allergies:  Allergies  Allergen  Reactions   Penicillins Rash    Has patient had a PCN reaction causing immediate rash, facial/tongue/throat swelling, SOB or lightheadedness with hypotension: Yes Has patient had a PCN reaction causing severe rash involving mucus membranes or skin necrosis: No Has patient had a PCN reaction that required hospitalization: No Has patient had a  PCN reaction occurring within the last 10 years: No If all of the above answers are "NO", then may proceed with Cephalosporin use.    Medications:  Current Outpatient Medications:    brompheniramine-pseudoephedrine-DM 30-2-10 MG/5ML syrup, Take 5 mLs by mouth 4 (four) times daily as needed., Disp: 118 mL, Rfl: 0   fluticasone (FLONASE) 50 MCG/ACT nasal spray, Place 2 sprays into both nostrils daily., Disp: 16 g, Rfl: 0   predniSONE (DELTASONE) 20 MG tablet, Take 1 tablet (20 mg total) by mouth 2 (two) times daily with a meal for 5 days., Disp: 10 tablet, Rfl: 0  Observations/Objective: Patient is well-developed, well-nourished in no acute distress.  Resting comfortably  at home.  Head is normocephalic, atraumatic.  No labored breathing.  Speech is clear and coherent with logical content.  Patient is alert and oriented at baseline.  Nasal congestion  Coarse cough  Assessment and Plan: 1. Flu-like symptoms  Continue prednisone  Rest Force fluids Droplet precautions  Follow up if symptoms worsen or do not improve   Follow Up Instructions: I discussed the assessment and treatment plan with the patient. The patient was provided an opportunity to ask questions and all were answered. The patient agreed with the plan and demonstrated an understanding of the instructions.  A copy of instructions were sent to the patient via MyChart unless otherwise noted below.     The patient was advised to call back or seek an in-person evaluation if the symptoms worsen or if the condition fails to improve as anticipated.  Time:  I spent 12 minutes with the patient via telehealth technology discussing the above problems/concerns.    Jannifer Rodney, FNP

## 2022-04-07 ENCOUNTER — Telehealth: Payer: Medicaid Other | Admitting: Physician Assistant

## 2022-04-07 DIAGNOSIS — R197 Diarrhea, unspecified: Secondary | ICD-10-CM

## 2022-04-07 DIAGNOSIS — R112 Nausea with vomiting, unspecified: Secondary | ICD-10-CM | POA: Diagnosis not present

## 2022-04-07 MED ORDER — ONDANSETRON HCL 4 MG PO TABS: 4.0000 mg | ORAL_TABLET | Freq: Three times a day (TID) | ORAL | 0 refills | Status: DC | PRN

## 2022-04-07 NOTE — Progress Notes (Signed)
Virtual Visit Consent   Patty Mata, you are scheduled for a virtual visit with a [facility] provider today. Just as with appointments in the office, your consent must be obtained to participate. Your consent will be active for this visit and any virtual visit you may have with one of our providers in the next 365 days. If you have a MyChart account, a copy of this consent can be sent to you electronically.  As this is a virtual visit, video technology does not allow for your provider to perform a traditional examination. This may limit your provider's ability to fully assess your condition. If your provider identifies any concerns that need to be evaluated in person or the need to arrange testing (such as labs, EKG, etc.), we will make arrangements to do so. Although advances in technology are sophisticated, we cannot ensure that it will always work on either your end or our end. If the connection with a video visit is poor, the visit may have to be switched to a telephone visit. With either a video or telephone visit, we are not always able to ensure that we have a secure connection.  By engaging in this virtual visit, you consent to the provision of healthcare and authorize for your insurance to be billed (if applicable) for the services provided during this visit. Depending on your insurance coverage, you may receive a charge related to this service.  I need to obtain your verbal consent now. Are you willing to proceed with your visit today? Patty Mata has provided verbal consent on 04/07/2022 for a virtual visit (video or telephone). Tylene Fantasia Ward, PA-C  Date: 04/07/2022 7:46 PM  Virtual Visit via Video Note   I, Tylene Fantasia Ward, connected with  Patty Mata  ([id number], July 28, 1990) on 04/07/22 at  7:45 PM EST by a video-enabled telemedicine application and verified that I am speaking with the correct person using two identifiers.  Location: Patient: Virtual Visit Location Patient: Other:  bus Provider: Virtual Visit Location Provider: Home Office   I discussed the limitations of evaluation and management by telemedicine and the availability of in person appointments. The patient expressed understanding and agreed to proceed.    History of Present Illness: Patty Mata is a 32 y.o. who identifies as a female who was assigned female at birth, and is being seen today for nausea, vomiting, and diarrhea that started earlier today.  Denies fever, congestion, or cough.  Denies sick contacts.    HPI: HPI  Problems:  Patient Active Problem List   Diagnosis Date Noted   Substance induced mood disorder (HCC) 10/31/2019   Severe recurrent major depression without psychotic features (HCC) 10/26/2019   Alcohol-induced mood disorder (HCC)    Cholestasis 03/03/2018   Elevated liver function tests 02/19/2018   No leakage of amniotic fluid into vagina 01/07/2018   Rubella non-immune status, antepartum 11/04/2017   Frequent UTI 10/30/2017   Anxiety and depression 10/30/2017    Allergies:  Allergies  Allergen Reactions   Penicillins Rash    Has patient had a PCN reaction causing immediate rash, facial/tongue/throat swelling, SOB or lightheadedness with hypotension: Yes Has patient had a PCN reaction causing severe rash involving mucus membranes or skin necrosis: No Has patient had a PCN reaction that required hospitalization: No Has patient had a PCN reaction occurring within the last 10 years: No If all of the above answers are "NO", then may proceed with Cephalosporin use.    Medications:  Current Outpatient Medications:    ondansetron (ZOFRAN) 4 MG tablet, Take 1 tablet (4 mg total) by mouth every 8 (eight) hours as needed for nausea or vomiting., Disp: 20 tablet, Rfl: 0   brompheniramine-pseudoephedrine-DM 30-2-10 MG/5ML syrup, Take 5 mLs by mouth 4 (four) times daily as needed., Disp: 118 mL, Rfl: 0   fluticasone (FLONASE) 50 MCG/ACT nasal spray, Place 2 sprays into both  nostrils daily., Disp: 16 g, Rfl: 0  Observations/Objective: Patient is well-developed, well-nourished in no acute distress.  Resting comfortably  at home.  Head is normocephalic, atraumatic.  No labored breathing.  Speech is clear and coherent with logical content.  Patient is alert and oriented at baseline.    Assessment and Plan: 1. Nausea vomiting and diarrhea - ondansetron (ZOFRAN) 4 MG tablet; Take 1 tablet (4 mg total) by mouth every 8 (eight) hours as needed for nausea or vomiting.  Dispense: 20 tablet; Refill: 0  Advised fluids, rest, and imodium as needed for diarrhea.  In person evaluation precautions discussed.   Follow Up Instructions: I discussed the assessment and treatment plan with the patient. The patient was provided an opportunity to ask questions and all were answered. The patient agreed with the plan and demonstrated an understanding of the instructions.  A copy of instructions were sent to the patient via MyChart unless otherwise noted below.     The patient was advised to call back or seek an in-person evaluation if the symptoms worsen or if the condition fails to improve as anticipated.  Time:  I spent 4 minutes with the patient via telehealth technology discussing the above problems/concerns.    Lenise Arena Ward, PA-C

## 2022-04-07 NOTE — Patient Instructions (Signed)
  Dawson Bills, thank you for joining Ramsey, PA-C for today's virtual visit.  While this provider is not your primary care provider (PCP), if your PCP is located in our provider database this encounter information will be shared with them immediately following your visit.   [place] account gives you access to today's visit and all your visits, tests, and labs performed at [facility] " click here if you don't have a [facility] account or go to [email or web address]  Consent: (Patient) KYAIRA TRANTHAM provided verbal consent for this virtual visit at the beginning of the encounter.  Current Medications:  Current Outpatient Medications:    ondansetron (ZOFRAN) 4 MG tablet, Take 1 tablet (4 mg total) by mouth every 8 (eight) hours as needed for nausea or vomiting., Disp: 20 tablet, Rfl: 0   brompheniramine-pseudoephedrine-DM 30-2-10 MG/5ML syrup, Take 5 mLs by mouth 4 (four) times daily as needed., Disp: 118 mL, Rfl: 0   fluticasone (FLONASE) 50 MCG/ACT nasal spray, Place 2 sprays into both nostrils daily., Disp: 16 g, Rfl: 0   Medications ordered in this encounter:  Meds ordered this encounter  Medications   ondansetron (ZOFRAN) 4 MG tablet    Sig: Take 1 tablet (4 mg total) by mouth every 8 (eight) hours as needed for nausea or vomiting.    Dispense:  20 tablet    Refill:  0    Order Specific Question:   Supervising Provider    Answer:   Chase Picket [id number]     *If you need refills on other medications prior to your next appointment, please contact your pharmacy*  Follow-Up: Call back or seek an in-person evaluation if the symptoms worsen or if the condition fails to improve as anticipated.  [place] [phone]  Other Instructions Take Zofran as needed for nausea.  Make sure you are drinking plenty of fluids.  Recommend Gatorade or Pedialyte.  You can take Imodium as needed for diarrhea.   If you have been  instructed to have an in-person evaluation today at a local Urgent Care facility, please use the link below. It will take you to a list of all of our available [facility], including address, phone number and hours of operation. Please do not delay care.  [facility]  If you or a family member do not have a primary care provider, use the link below to schedule a visit and establish care. When you choose a [facility] primary care physician or advanced practice provider, you gain a long-term partner in health. Find a Primary Care Provider  Learn more about [facility]'s in-office and virtual care options: [facility]

## 2022-04-13 ENCOUNTER — Telehealth: Payer: Medicaid Other | Admitting: Physician Assistant

## 2022-04-13 ENCOUNTER — Telehealth: Payer: Medicaid Other

## 2022-04-16 ENCOUNTER — Telehealth: Payer: Medicaid Other | Admitting: Family Medicine

## 2022-04-16 DIAGNOSIS — R2231 Localized swelling, mass and lump, right upper limb: Secondary | ICD-10-CM

## 2022-04-16 NOTE — Progress Notes (Signed)
Virtual Visit Consent   Patty Mata, you are scheduled for a virtual visit with a [facility] provider today. Just as with appointments in the office, your consent must be obtained to participate. Your consent will be active for this visit and any virtual visit you may have with one of our providers in the next 365 days. If you have a MyChart account, a copy of this consent can be sent to you electronically.  As this is a virtual visit, video technology does not allow for your provider to perform a traditional examination. This may limit your provider's ability to fully assess your condition. If your provider identifies any concerns that need to be evaluated in person or the need to arrange testing (such as labs, EKG, etc.), we will make arrangements to do so. Although advances in technology are sophisticated, we cannot ensure that it will always work on either your end or our end. If the connection with a video visit is poor, the visit may have to be switched to a telephone visit. With either a video or telephone visit, we are not always able to ensure that we have a secure connection.  By engaging in this virtual visit, you consent to the provision of healthcare and authorize for your insurance to be billed (if applicable) for the services provided during this visit. Depending on your insurance coverage, you may receive a charge related to this service.  I need to obtain your verbal consent now. Are you willing to proceed with your visit today? Patty Mata has provided verbal consent on 04/16/2022 for a virtual visit (video or telephone). Perlie Mayo, NP  Date: 04/16/2022 2:18 PM  Virtual Visit via Video Note   I, Perlie Mayo, connected with  Patty Mata  ([id number], 12/17/1990) on 04/16/22 at  2:15 PM EST by a video-enabled telemedicine application and verified that I am speaking with the correct person using two identifiers.  Location: Patient: Virtual Visit Location Patient:  Home Provider: Virtual Visit Location Provider: Home Office   I discussed the limitations of evaluation and management by telemedicine and the availability of in person appointments. The patient expressed understanding and agreed to proceed.    History of Present Illness: Patty Mata is a 32 y.o. who identifies as a female who was assigned female at birth, and is being seen today for growth/ lump under her right arm. Onset was 2 years ago- reports a few days ago it has gotten bigger and more painful- thought stress related. Has not really had issues since having it come up a few years ago. Reports pressure feeling and unable to put all the arm all the way down. No drainage.   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   Alcohol-induced mood disorder ([place])    Cholestasis 03/03/2018   Elevated liver function tests 02/19/2018   No leakage of amniotic fluid into vagina 01/07/2018   Rubella non-immune status, antepartum 11/04/2017   Frequent UTI 10/30/2017   Anxiety and depression 10/30/2017    Allergies:  Allergies  Allergen Reactions   Penicillins Rash    Has patient had a PCN reaction causing immediate rash, facial/tongue/throat swelling, SOB or lightheadedness with hypotension: Yes Has patient had a PCN reaction causing severe rash involving mucus membranes or skin necrosis: No Has patient had a PCN reaction that required hospitalization:  No Has patient had a PCN reaction occurring within the last 10 years: No If all of the above answers are "NO", then may proceed with Cephalosporin use.    Medications:  Current Outpatient Medications:    brompheniramine-pseudoephedrine-DM 30-2-10 MG/5ML syrup, Take 5 mLs by mouth 4 (four) times daily as needed., Disp: 118 mL, Rfl: 0   fluticasone (FLONASE) 50 MCG/ACT nasal spray, Place 2 sprays  into both nostrils daily., Disp: 16 g, Rfl: 0   ondansetron (ZOFRAN) 4 MG tablet, Take 1 tablet (4 mg total) by mouth every 8 (eight) hours as needed for nausea or vomiting., Disp: 20 tablet, Rfl: 0  Observations/Objective: Patient is well-developed, well-nourished in no acute distress.  Resting comfortably  at home.  Head is normocephalic, atraumatic.  No labored breathing.  Speech is clear and coherent with logical content.  Patient is alert and oriented at baseline.   Unable to assess site   Assessment and Plan:  1. Mass of right axilla  Needs to be assessed in person to identify issue given the duration and presentation.  Patient acknowledged agreement and understanding of the plan.   Appt ended- [place]    Follow Up Instructions: I discussed the assessment and treatment plan with the patient. The patient was provided an opportunity to ask questions and all were answered. The patient agreed with the plan and demonstrated an understanding of the instructions.  A copy of instructions were sent to the patient via MyChart unless otherwise noted below.     The patient was advised to call back or seek an in-person evaluation if the symptoms worsen or if the condition fails to improve as anticipated.  Time:  I spent 3 minutes with the patient via telehealth technology discussing the above problems/concerns.    Perlie Mayo, NP

## 2022-04-16 NOTE — Patient Instructions (Signed)
  Dawson Bills, thank you for joining Perlie Mayo, NP for today's virtual visit.  While this provider is not your primary care provider (PCP), if your PCP is located in our provider database this encounter information will be shared with them immediately following your visit.   [facility] account gives you access to today's visit and all your visits, tests, and labs performed at [facility] " click here if you don't have a [facility] account or go to [email or web address]  Consent: (Patient) XINYI BATTON provided verbal consent for this virtual visit at the beginning of the encounter.  Current Medications:  Current Outpatient Medications:    brompheniramine-pseudoephedrine-DM 30-2-10 MG/5ML syrup, Take 5 mLs by mouth 4 (four) times daily as needed., Disp: 118 mL, Rfl: 0   fluticasone (FLONASE) 50 MCG/ACT nasal spray, Place 2 sprays into both nostrils daily., Disp: 16 g, Rfl: 0   ondansetron (ZOFRAN) 4 MG tablet, Take 1 tablet (4 mg total) by mouth every 8 (eight) hours as needed for nausea or vomiting., Disp: 20 tablet, Rfl: 0   Medications ordered in this encounter:  No orders of the defined types were placed in this encounter.    *If you need refills on other medications prior to your next appointment, please contact your pharmacy*  Follow-Up: Call back or seek an in-person evaluation if the symptoms worsen or if the condition fails to improve as anticipated.  [place] [phone]  Other Instructions   If you have been instructed to have an in-person evaluation today at a local Urgent Care facility, please use the link below. It will take you to a list of all of our available [facility], including address, phone number and hours of operation. Please do not delay care.  [facility]  If you or a family member do not have a primary care provider, use the link below to schedule a visit and establish  care. When you choose a [facility] primary care physician or advanced practice provider, you gain a long-term partner in health. Find a Primary Care Provider  Learn more about [facility]'s in-office and virtual care options: [facility]

## 2022-04-17 ENCOUNTER — Ambulatory Visit (HOSPITAL_COMMUNITY)
Admission: EM | Admit: 2022-04-17 | Discharge: 2022-04-17 | Disposition: A | Discharge status: Home / Self Care | Payer: Medicaid Other | Attending: Family Medicine | Admitting: Family Medicine

## 2022-04-17 ENCOUNTER — Encounter (HOSPITAL_COMMUNITY): Payer: Self-pay | Admitting: Emergency Medicine

## 2022-04-17 DIAGNOSIS — L03111 Cellulitis of right axilla: Secondary | ICD-10-CM

## 2022-04-17 MED ORDER — CLINDAMYCIN HCL 300 MG PO CAPS: 300.0000 mg | ORAL_CAPSULE | Freq: Three times a day (TID) | ORAL | 0 refills | Status: DC

## 2022-04-17 MED ORDER — KETOROLAC TROMETHAMINE 30 MG/ML IJ SOLN
INTRAMUSCULAR | Status: AC
  Filled 2022-04-17: qty 1

## 2022-04-17 MED ORDER — KETOROLAC TROMETHAMINE 30 MG/ML IJ SOLN
30.0000 mg | Freq: Once | INTRAMUSCULAR | Status: AC
  Administered 2022-04-17: 30 mg via INTRAMUSCULAR

## 2022-04-17 MED ORDER — IBUPROFEN 800 MG PO TABS: 800.0000 mg | ORAL_TABLET | Freq: Three times a day (TID) | ORAL | 0 refills | Status: DC | PRN

## 2022-04-17 NOTE — ED Provider Notes (Addendum)
[facility]    CSN: [id number] Arrival date & time: 04/17/22  1902      History   Chief Complaint Chief Complaint  Patient presents with   Abscess    HPI Patty Mata is a 32 y.o. female.    Abscess  Here for pain and swelling in her right axilla.  She has had a knot in her right underarm for about a year, but then in the last 3 days it became more painful and got a little bigger.  She has not had any fever or chills or vomiting.  She last had a period December 20.  She is allergic to penicillins which cause a rash.  I can see in epic that she has tolerated cephalosporins in the past  Past Medical History:  Diagnosis Date   Accidental drug overdose    Anxiety    Depression    History of kidney stones    Kidney stones    Pyelonephritis     Patient Active Problem List   Diagnosis Date Noted   Substance induced mood disorder ([place]) 10/31/2019   Severe recurrent major depression without psychotic features ([place]) 10/26/2019   Alcohol-induced mood disorder ([place])    Cholestasis 03/03/2018   Elevated liver function tests 02/19/2018   No leakage of amniotic fluid into vagina 01/07/2018   Rubella non-immune status, antepartum 11/04/2017   Frequent UTI 10/30/2017   Anxiety and depression 10/30/2017    Past Surgical History:  Procedure Laterality Date   STENT PLACEMENT NON-VASCULAR ([place] HX)     renal   TUBAL LIGATION Bilateral 03/06/2018   Procedure: POST PARTUM TUBAL LIGATION;  Surgeon: Guss Bunde, MD;  Location: [facility] [place] [facility];  Service: Gynecology;  Laterality: Bilateral;    OB History     Gravida  2   Para  2   Term  2   Preterm      AB      Living  2      SAB      IAB      Ectopic      Multiple  0   Live Births  2            Home Medications    Prior to Admission medications   Medication Sig Start Date End Date Taking? Authorizing Provider  clindamycin (CLEOCIN) 300 MG capsule Take 1 capsule (300 mg total) by  mouth 3 (three) times daily for 7 days. 04/17/22 04/24/22 Yes Barrett Henle, MD  ibuprofen (ADVIL) 800 MG tablet Take 1 tablet (800 mg total) by mouth every 8 (eight) hours as needed (pain). 04/17/22  Yes Barrett Henle, MD  amLODipine (NORVASC) 5 MG tablet Take 1 tablet (5 mg total) by mouth daily. Patient not taking: Reported on 12/05/2018 03/07/18 07/14/19  Jonnie Kind, MD  sertraline (ZOLOFT) 25 MG tablet Take 1 tablet (25 mg total) by mouth daily for 7 days. 11/01/19 12/14/19  Sharma Covert, MD    Family History Family History  Problem Relation Age of Onset   Depression Mother    Arthritis Mother    Asthma Father    Mental illness Father    Cancer Maternal Grandfather     Social History Social History   Tobacco Use   Smoking status: Every Day    Packs/day: 1.00    Years: 5.00    Total pack years: 5.00    Types: Cigarettes   Smokeless tobacco: Never  Vaping Use  Vaping Use: Never used  Substance Use Topics   Alcohol use: Not Currently   Drug use: Not Currently    Comment: Xanax     Allergies   Penicillins   Review of Systems Review of Systems   Physical Exam Triage Vital Signs ED Triage Vitals  Enc Vitals Group     BP 04/17/22 1945 119/83     Pulse Rate 04/17/22 1945 87     Resp 04/17/22 1945 16     Temp 04/17/22 1945 98.1 F (36.7 C)     Temp Source 04/17/22 1945 Oral     SpO2 04/17/22 1945 98 %     Weight --      Height --      Head Circumference --      Peak Flow --      Pain Score 04/17/22 1943 10     Pain Loc --      Pain Edu? --      Excl. in GC? --    No data found.  Updated Vital Signs BP 119/83 (BP Location: Left Arm)   Pulse 87   Temp 98.1 F (36.7 C) (Oral)   Resp 16   LMP 03/20/2022   SpO2 98%   Visual Acuity Right Eye Distance:   Left Eye Distance:   Bilateral Distance:    Right Eye Near:   Left Eye Near:    Bilateral Near:     Physical Exam Vitals reviewed.  Constitutional:      General: She is not in  acute distress.    Appearance: She is not ill-appearing, toxic-appearing or diaphoretic.  Skin:    Coloration: Skin is not jaundiced or pale.     Comments: In her right axilla there is an area of induration and redness and tenderness that is 2 cm x 1 cm.  There is no fluctuance that I can palpate.  It is raised.  Neurological:     General: No focal deficit present.     Mental Status: She is alert and oriented to person, place, and time.  Psychiatric:        Behavior: Behavior normal.      UC Treatments / Results  Labs (all labs ordered are listed, but only abnormal results are displayed) Labs Reviewed - No data to display  EKG   Radiology No results found.  Procedures Procedures (including critical care time)  Medications Ordered in UC Medications  ketorolac (TORADOL) 30 MG/ML injection 30 mg (has no administration in time range)    Initial Impression / Assessment and Plan / UC Course  I have reviewed the triage vital signs and the nursing notes.  Pertinent labs & imaging results that were available during my care of the patient were reviewed by me and considered in my medical decision making (see chart for details).        I discussed with her that at this point this is not ready to be drained.  She will be treated with clindamycin and she will do warm compresses at home.  Toradol was given for pain here and ibuprofen is sent into the pharmacy.  We discussed returning if it begins to point and needs draining Final Clinical Impressions(s) / UC Diagnoses   Final diagnoses:  Cellulitis of right axilla     Discharge Instructions      You have been given a shot of Toradol 30 mg today.  Take clindamycin 300 mg-- 1 capsule 3 times daily for 7 days; this  is the antibiotic  Take ibuprofen 800 mg--1 tab every 8 hours as needed for pain.       ED Prescriptions     Medication Sig Dispense Auth. Provider   clindamycin (CLEOCIN) 300 MG capsule Take 1 capsule  (300 mg total) by mouth 3 (three) times daily for 7 days. 21 capsule Barrett Henle, MD   ibuprofen (ADVIL) 800 MG tablet Take 1 tablet (800 mg total) by mouth every 8 (eight) hours as needed (pain). 21 tablet Shauntavia Brackin, Gwenlyn Perking, MD      PDMP not reviewed this encounter.   Barrett Henle, MD 04/17/22 2019    Barrett Henle, MD 04/17/22 2019

## 2022-04-17 NOTE — Discharge Instructions (Signed)
You have been given a shot of Toradol 30 mg today.  Take clindamycin 300 mg-- 1 capsule 3 times daily for 7 days; this is the antibiotic  Take ibuprofen 800 mg--1 tab every 8 hours as needed for pain.

## 2022-04-17 NOTE — ED Triage Notes (Signed)
Pt reports abscess in right axilla that has been there over a year but become more swollen and painful in the past 3 days.  Had virtual visit yesterday and advised to get seen.  Denies drainage

## 2022-04-20 ENCOUNTER — Encounter: Payer: Medicaid Other | Admitting: Nurse Practitioner

## 2022-04-20 NOTE — Progress Notes (Signed)
Patient needed an extended work note form ED. Patient was told will need to get extended note form ED that she was een at. [place] cancelled video visit.

## 2022-04-21 ENCOUNTER — Encounter (HOSPITAL_COMMUNITY): Payer: Self-pay

## 2022-04-21 ENCOUNTER — Ambulatory Visit (HOSPITAL_COMMUNITY)
Admission: EM | Admit: 2022-04-21 | Discharge: 2022-04-21 | Disposition: A | Discharge status: Home / Self Care | Payer: Medicaid Other | Attending: Emergency Medicine | Admitting: Emergency Medicine

## 2022-04-21 DIAGNOSIS — L732 Hidradenitis suppurativa: Secondary | ICD-10-CM

## 2022-04-21 MED ORDER — LIDOCAINE HCL (PF) 2 % IJ SOLN
INTRAMUSCULAR | Status: AC
  Filled 2022-04-21: qty 5

## 2022-04-21 MED ORDER — TRIAMCINOLONE ACETONIDE 40 MG/ML IJ SUSP
INTRAMUSCULAR | Status: AC
  Filled 2022-04-21: qty 1

## 2022-04-21 MED ORDER — DOXYCYCLINE HYCLATE 100 MG PO CAPS: 100.0000 mg | ORAL_CAPSULE | Freq: Two times a day (BID) | ORAL | 2 refills | Status: AC

## 2022-04-21 NOTE — ED Provider Notes (Signed)
Patty Mata CARE    CSN: [id number] Arrival date & time: 04/21/22  1719    HISTORY   Chief Complaint  Patient presents with   Abscess   HPI Patty Mata is a pleasant, 32 y.o. female who presents to urgent care today. Patient complains of an abscess under her right arm has been present for a year.  Patient states she was seen 5 days ago and given prescription for an antibiotic but states the abscess has worsened.  Patient states she is also been applying warm compresses with no relief.  The history is provided by the patient.   Past Medical History:  Diagnosis Date   Accidental drug overdose    Anxiety    Depression    History of kidney stones    Kidney stones    Pyelonephritis    Patient Active Problem List   Diagnosis Date Noted   Substance induced mood disorder ([place]) 10/31/2019   Severe recurrent major depression without psychotic features ([place]) 10/26/2019   Alcohol-induced mood disorder ([facility])    Cholestasis 03/03/2018   Elevated liver function tests 02/19/2018   No leakage of amniotic fluid into vagina 01/07/2018   Rubella non-immune status, antepartum 11/04/2017   Frequent UTI 10/30/2017   Anxiety and depression 10/30/2017   Past Surgical History:  Procedure Laterality Date   STENT PLACEMENT NON-VASCULAR ([place] HX)     renal   TUBAL LIGATION Bilateral 03/06/2018   Procedure: POST PARTUM TUBAL LIGATION;  Surgeon: Guss Bunde, MD;  Location: [facility];  Service: Gynecology;  Laterality: Bilateral;   OB History     Gravida  2   Para  2   Term  2   Preterm      AB      Living  2      SAB      IAB      Ectopic      Multiple  0   Live Births  2          Home Medications    Prior to Admission medications   Medication Sig Start Date End Date Taking? Authorizing Provider  clindamycin (CLEOCIN) 300 MG capsule Take 1 capsule (300 mg total) by mouth 3 (three) times daily for 7 days. 04/17/22 04/24/22  Barrett Henle, MD   ibuprofen (ADVIL) 800 MG tablet Take 1 tablet (800 mg total) by mouth every 8 (eight) hours as needed (pain). 04/17/22   Barrett Henle, MD  amLODipine (NORVASC) 5 MG tablet Take 1 tablet (5 mg total) by mouth daily. Patient not taking: Reported on 12/05/2018 03/07/18 07/14/19  Jonnie Kind, MD  sertraline (ZOLOFT) 25 MG tablet Take 1 tablet (25 mg total) by mouth daily for 7 days. 11/01/19 12/14/19  Sharma Covert, MD    Family History Family History  Problem Relation Age of Onset   Depression Mother    Arthritis Mother    Asthma Father    Mental illness Father    Cancer Maternal Grandfather    Social History Social History   Tobacco Use   Smoking status: Every Day    Packs/day: 0.50    Years: 5.00    Total pack years: 2.50    Types: Cigarettes   Smokeless tobacco: Never  Vaping Use   Vaping Use: Never used  Substance Use Topics   Alcohol use: Not Currently   Drug use: Not Currently    Comment: Xanax   Allergies   Penicillins  Review  of Systems Review of Systems Pertinent findings revealed after performing a 14 point review of systems has been noted in the history of present illness.  Physical Exam Triage Vital Signs ED Triage Vitals  Enc Vitals Group     BP 01/26/21 0827 (!) 147/82     Pulse Rate 01/26/21 0827 72     Resp 01/26/21 0827 18     Temp 01/26/21 0827 98.3 F (36.8 C)     Temp Source 01/26/21 0827 Oral     SpO2 01/26/21 0827 98 %     Weight --      Height --      Head Circumference --      Peak Flow --      Pain Score 01/26/21 0826 5     Pain Loc --      Pain Edu? --      Excl. in GC? --   No data found.  Updated Vital Signs BP (!) 148/80 (BP Location: Left Arm)   Pulse 99   Temp 98.8 F (37.1 C) (Oral)   Resp 16   LMP 03/20/2022   SpO2 97%   Physical Exam Vitals and nursing note reviewed.  Constitutional:      General: She is not in acute distress.    Appearance: Normal appearance.  HENT:     Head: Normocephalic and  atraumatic.  Eyes:     Pupils: Pupils are equal, round, and reactive to light.  Cardiovascular:     Rate and Rhythm: Normal rate and regular rhythm.  Pulmonary:     Effort: Pulmonary effort is normal.     Breath sounds: Normal breath sounds.  Musculoskeletal:        General: Normal range of motion.     Cervical back: Normal range of motion and neck supple.  Skin:    General: Skin is warm and dry.     Findings: Lesion (3 cm x 1.5 cm lesion with surrounding induration, mild fluctuance, tender to palpation) present.  Neurological:     General: No focal deficit present.     Mental Status: She is alert and oriented to person, place, and time. Mental status is at baseline.  Psychiatric:        Mood and Affect: Mood normal.        Behavior: Behavior normal.        Thought Content: Thought content normal.        Judgment: Judgment normal.     Visual Acuity Right Eye Distance:   Left Eye Distance:   Bilateral Distance:    Right Eye Near:   Left Eye Near:    Bilateral Near:     UC Couse / Diagnostics / Procedures:     Radiology No results found.  Procedures Wound Care  Date/Time: 04/21/2022 6:23 PM  Performed by: Theadora Rama Scales, PA-C Authorized by: Theadora Rama Scales, PA-C   Consent:    Consent obtained:  Verbal   Consent given by:  Patient   Risks, benefits, and alternatives were discussed: yes     Risks discussed:  Bleeding, incomplete drainage, infection, pain, poor cosmetic result and nerve damage   Alternatives discussed:  No treatment, delayed treatment, alternative treatment, observation and referral Universal protocol:    Procedure explained and questions answered to patient or proxy's satisfaction: yes     Patient identity confirmed:  Verbally with patient Pre-procedure details:    Preparation: Patient was prepped and draped in usual sterile fashion   Sedation:  Sedation type:  None Anesthesia:    Anesthesia method:  Local infiltration (0.75 mL  of lidocaine 2% was injected into the wound along with 0.25 mL of Kenalog 40 mg/mL)   Local anesthetic:  Lidocaine 2% w/o epi Procedure details:    Indications: benign lesion     Benign lesion:  Hidradenitis suppurativa   Wound location: Right axilla.   Wound age (days):  >14   Wound surface area (sq cm):  4.5   Debridement performed: No   Skin layer closed with:    Wound care performed:  Nothing Dressing:    Dressing applied:  Tegaderm 2x3 Post-procedure details:    Procedure completion:  Tolerated  (including critical care time) EKG  Pending results:  Labs Reviewed - No data to display  Medications Ordered in UC: Medications - No data to display  UC Diagnoses / Final Clinical Impressions(s)   I have reviewed the triage vital signs and the nursing notes.  Pertinent labs & imaging results that were available during my care of the patient were reviewed by me and considered in my medical decision making (see chart for details).    Final diagnoses:  Hidradenitis suppurativa of right axilla   Patient was offered I&D versus steroid injection, patient requesting steroid injection to help resolve the persistent lesion.  Patient was also advised to discontinue clindamycin and begin doxycycline for empiric treatment of presumed hidradenitis suppurativa.  Patient was advised to use head and shoulders shampoo to clean under her arms.  Return precautions advised.  Please see discharge instructions below for details of plan of care as provided to patient. ED Prescriptions     Medication Sig Dispense Auth. Provider   doxycycline (VIBRAMYCIN) 100 MG capsule Take 1 capsule (100 mg total) by mouth 2 (two) times daily. 60 capsule Theadora Rama Scales, PA-C      PDMP not reviewed this encounter.  Pending results:  Labs Reviewed - No data to display  Discharge Instructions:   Discharge Instructions      During your visit today, you received an injection of Kenalog which is a potent  steroid which will reduce the inflammation causing the pain and swelling of the lesion under your arm.  This is known as hidradenitis suppurativa.  I would like for you to discontinue clindamycin and begin doxycycline 1 capsule twice daily and continue doxycycline for the next 30 days.  This may sound odd but please purchase a bottle of head and shoulders original formula shampoo at the pharmacy when you pick up your prescription for doxycycline and wash both underarms with the shampoo daily.  Head and shoulders shampoo contains topical exfoliant that will help open the pores on your arms and prevent future lesions from occurring.  If you do not feel like you have had significant improvement of the swelling and pain of the lesion by tomorrow evening, please come back to see me at the [facility] urgent care located in [place] on [place] for further evaluation and possible full drainage of the lesion.  Thank you for visiting urgent care today.      Disposition Upon Discharge:  Condition: stable for discharge home  Patient presented with an acute illness with associated systemic symptoms and significant discomfort requiring urgent management. In my opinion, this is a condition that a prudent lay person (someone who possesses an average knowledge of health and medicine) may potentially expect to result in complications if not addressed urgently such as respiratory distress, impairment of bodily function  or dysfunction of bodily organs.   Routine symptom specific, illness specific and/or disease specific instructions were discussed with the patient and/or caregiver at length.   As such, the patient has been evaluated and assessed, work-up was performed and treatment was provided in alignment with urgent care protocols and evidence based medicine.  Patient/parent/caregiver has been advised that the patient may require follow up for further testing and treatment if the symptoms continue in  spite of treatment, as clinically indicated and appropriate.  Patient/parent/caregiver has been advised to return to the [facility] or PCP if no better; to PCP or the Emergency Department if new signs and symptoms develop, or if the current signs or symptoms continue to change or worsen for further workup, evaluation and treatment as clinically indicated and appropriate  The patient will follow up with their current PCP if and as advised. If the patient does not currently have a PCP we will assist them in obtaining one.   The patient may need specialty follow up if the symptoms continue, in spite of conservative treatment and management, for further workup, evaluation, consultation and treatment as clinically indicated and appropriate.  Patient/parent/caregiver verbalized understanding and agreement of plan as discussed.  All questions were addressed during visit.  Please see discharge instructions below for further details of plan.  This office note has been dictated using Museum/gallery curator.  Unfortunately, this method of dictation can sometimes lead to typographical or grammatical errors.  I apologize for your inconvenience in advance if this occurs.  Please do not hesitate to reach out to me if clarification is needed.      Lynden Oxford Scales, [place] 04/22/22 [phone]

## 2022-04-21 NOTE — Discharge Instructions (Signed)
During your visit today, you received an injection of Kenalog which is a potent steroid which will reduce the inflammation causing the pain and swelling of the lesion under your arm.  This is known as hidradenitis suppurativa.  I would like for you to discontinue clindamycin and begin doxycycline 1 capsule twice daily and continue doxycycline for the next 30 days.  This may sound odd but please purchase a bottle of head and shoulders original formula shampoo at the pharmacy when you pick up your prescription for doxycycline and wash both underarms with the shampoo daily.  Head and shoulders shampoo contains topical exfoliant that will help open the pores on your arms and prevent future lesions from occurring.  If you do not feel like you have had significant improvement of the swelling and pain of the lesion by tomorrow evening, please come back to see me at the [facility] urgent care located in [place] on [place] for further evaluation and possible full drainage of the lesion.  Thank you for visiting urgent care today.

## 2022-04-21 NOTE — ED Triage Notes (Signed)
Patient reports that she has a right axillary abscess for over a year. Patient states she was seen on 17/24 and given prescriptions. Patient states the abscess has worsened.  Patient states she has also completed the compresses.

## 2022-05-06 ENCOUNTER — Telehealth: Payer: Medicaid Other | Admitting: Physician Assistant

## 2022-05-06 DIAGNOSIS — J069 Acute upper respiratory infection, unspecified: Secondary | ICD-10-CM

## 2022-05-06 MED ORDER — PROMETHAZINE-DM 6.25-15 MG/5ML PO SYRP: 5.0000 mL | ORAL_SOLUTION | Freq: Four times a day (QID) | ORAL | 0 refills | Status: DC | PRN

## 2022-05-06 MED ORDER — FLUTICASONE PROPIONATE 50 MCG/ACT NA SUSP: 2.0000 | Freq: Every day | NASAL | 6 refills | Status: AC

## 2022-05-06 NOTE — Progress Notes (Signed)
Virtual Visit Consent   Patty Mata, you are scheduled for a virtual visit with a [facility] provider today. Just as with appointments in the office, your consent must be obtained to participate. Your consent will be active for this visit and any virtual visit you may have with one of our providers in the next 365 days. If you have a MyChart account, a copy of this consent can be sent to you electronically.  As this is a virtual visit, video technology does not allow for your provider to perform a traditional examination. This may limit your provider's ability to fully assess your condition. If your provider identifies any concerns that need to be evaluated in person or the need to arrange testing (such as labs, EKG, etc.), we will make arrangements to do so. Although advances in technology are sophisticated, we cannot ensure that it will always work on either your end or our end. If the connection with a video visit is poor, the visit may have to be switched to a telephone visit. With either a video or telephone visit, we are not always able to ensure that we have a secure connection.  By engaging in this virtual visit, you consent to the provision of healthcare and authorize for your insurance to be billed (if applicable) for the services provided during this visit. Depending on your insurance coverage, you may receive a charge related to this service.  I need to obtain your verbal consent now. Are you willing to proceed with your visit today? AULANI SHIPTON has provided verbal consent on 05/06/2022 for a virtual visit (video or telephone). Patty Arena Ward, PA-C  Date: 05/06/2022 6:05 PM  Virtual Visit via Video Note   I, Patty Mata, connected with  Patty Mata  ([id number], 04-06-1990) on 05/06/22 at  6:00 PM EST by a video-enabled telemedicine application and verified that I am speaking with the correct person using two identifiers.  Location: Patient: Virtual Visit Location Patient:  Home Provider: Virtual Visit Location Provider: Home   I discussed the limitations of evaluation and management by telemedicine and the availability of in person appointments. The patient expressed understanding and agreed to proceed.    History of Present Illness: Patty Mata is a 32 y.o. who identifies as a female who was assigned female at birth, and is being seen today for cough and fever that started yesterday.  Reports fever of 102 this morning.  She has been taking Tylenol with reduction of fever.  She reports decreased appetite, reports when trying to eat she felt sick to her stomach.  Denies vomiting.  She reports productive cough.  Denies wheezing, shortness of breath.   HPI: HPI  Problems:  Patient Active Problem List   Diagnosis Date Noted   Substance induced mood disorder ([place]) 10/31/2019   Severe recurrent major depression without psychotic features ([place]) 10/26/2019   Alcohol-induced mood disorder ([place])    Cholestasis 03/03/2018   Elevated liver function tests 02/19/2018   No leakage of amniotic fluid into vagina 01/07/2018   Rubella non-immune status, antepartum 11/04/2017   Frequent UTI 10/30/2017   Anxiety and depression 10/30/2017    Allergies:  Allergies  Allergen Reactions   Penicillins Rash    Has patient had a PCN reaction causing immediate rash, facial/tongue/throat swelling, SOB or lightheadedness with hypotension: Yes Has patient had a PCN reaction causing severe rash involving mucus membranes or skin necrosis: No Has patient had a PCN reaction that required hospitalization: No  Has patient had a PCN reaction occurring within the last 10 years: No If all of the above answers are "NO", then may proceed with Cephalosporin use.    Medications:  Current Outpatient Medications:    fluticasone (FLONASE) 50 MCG/ACT nasal spray, Place 2 sprays into both nostrils daily., Disp: 16 g, Rfl: 6   promethazine-dextromethorphan (PROMETHAZINE-DM) 6.25-15 MG/5ML syrup,  Take 5 mLs by mouth 4 (four) times daily as needed for cough., Disp: 118 mL, Rfl: 0   doxycycline (VIBRAMYCIN) 100 MG capsule, Take 1 capsule (100 mg total) by mouth 2 (two) times daily., Disp: 60 capsule, Rfl: 2   ibuprofen (ADVIL) 800 MG tablet, Take 1 tablet (800 mg total) by mouth every 8 (eight) hours as needed (pain)., Disp: 21 tablet, Rfl: 0  Observations/Objective: Patient is well-developed, well-nourished in no acute distress.  Resting comfortably at home.  Head is normocephalic, atraumatic.  No labored breathing.  Speech is clear and coherent with logical content.  Patient is alert and oriented at baseline.    Assessment and Plan: 1. Viral upper respiratory tract infection - fluticasone (FLONASE) 50 MCG/ACT nasal spray; Place 2 sprays into both nostrils daily.  Dispense: 16 g; Refill: 6 - promethazine-dextromethorphan (PROMETHAZINE-DM) 6.25-15 MG/5ML syrup; Take 5 mLs by mouth 4 (four) times daily as needed for cough.  Dispense: 118 mL; Refill: 0  Supportive care discussed.  Sx onset yesterday.  Advised to return to work once fever free for 24 hours.   Follow Up Instructions: I discussed the assessment and treatment plan with the patient. The patient was provided an opportunity to ask questions and all were answered. The patient agreed with the plan and demonstrated an understanding of the instructions.  A copy of instructions were sent to the patient via MyChart unless otherwise noted below.     The patient was advised to call back or seek an in-person evaluation if the symptoms worsen or if the condition fails to improve as anticipated.  Time:  I spent 15 minutes with the patient via telehealth technology discussing the above problems/concerns.    Patty Arena Ward, PA-C

## 2022-05-06 NOTE — Patient Instructions (Signed)
  Dawson Bills, thank you for joining [place], PA-C for today's virtual visit.  While this provider is not your primary care provider (PCP), if your PCP is located in our provider database this encounter information will be shared with them immediately following your visit.   [place] [facility] account gives you access to today's visit and all your visits, tests, and labs performed at [facility] " click here if you don't have a [facility] account or go to [email or web address]  Consent: (Patient) Patty Mata provided verbal consent for this virtual visit at the beginning of the encounter.  Current Medications:  Current Outpatient Medications:    fluticasone (FLONASE) 50 MCG/ACT nasal spray, Place 2 sprays into both nostrils daily., Disp: 16 g, Rfl: 6   promethazine-dextromethorphan (PROMETHAZINE-DM) 6.25-15 MG/5ML syrup, Take 5 mLs by mouth 4 (four) times daily as needed for cough., Disp: 118 mL, Rfl: 0   doxycycline (VIBRAMYCIN) 100 MG capsule, Take 1 capsule (100 mg total) by mouth 2 (two) times daily., Disp: 60 capsule, Rfl: 2   ibuprofen (ADVIL) 800 MG tablet, Take 1 tablet (800 mg total) by mouth every 8 (eight) hours as needed (pain)., Disp: 21 tablet, Rfl: 0   Medications ordered in this encounter:  Meds ordered this encounter  Medications   fluticasone (FLONASE) 50 MCG/ACT nasal spray    Sig: Place 2 sprays into both nostrils daily.    Dispense:  16 g    Refill:  6    Order Specific Question:   Supervising Provider    Answer:   Chase Picket [id number]   promethazine-dextromethorphan (PROMETHAZINE-DM) 6.25-15 MG/5ML syrup    Sig: Take 5 mLs by mouth 4 (four) times daily as needed for cough.    Dispense:  118 mL    Refill:  0    Order Specific Question:   Supervising Provider    Answer:   Chase Picket [[id number]]     *If you need refills on other medications prior to your next appointment, please contact your  pharmacy*  Follow-Up: Call back or seek an in-person evaluation if the symptoms worsen or if the condition fails to improve as anticipated.  [place] [phone]  Other Instructions Recommend Mucinex.  Drink plenty of fluids.  Continue with tylenol or ibuprofen as needed for fever.  Recommend return to work once fever free for 24 hours.  Recommend in person visit at Urgent Care or Primary Care office if you develop worsening symptoms.    If you have been instructed to have an in-person evaluation today at a local Urgent Care facility, please use the link below. It will take you to a list of all of our available [facility], including address, phone number and hours of operation. Please do not delay care.  [facility]  If you or a family member do not have a primary care provider, use the link below to schedule a visit and establish care. When you choose a [facility] primary care physician or advanced practice provider, you gain a long-term partner in health. Find a Primary Care Provider  Learn more about [facility] in-office and virtual care options: [facility]

## 2022-06-05 DIAGNOSIS — G500 Trigeminal neuralgia: Secondary | ICD-10-CM

## 2022-06-05 DIAGNOSIS — R6884 Jaw pain: Secondary | ICD-10-CM

## 2022-06-06 ENCOUNTER — Inpatient Hospital Stay
Admit: 2022-06-06 | Discharge: 2022-06-06 | Disposition: A | Discharge status: Home / Self Care | Payer: BLUE CROSS/BLUE SHIELD | Source: Home / Self Care | Attending: Student in an Organized Health Care Education/Training Program

## 2022-06-06 ENCOUNTER — Telehealth: Payer: BLUE CROSS/BLUE SHIELD

## 2022-06-06 MED ORDER — CARBAMAZEPINE ER 100 MG PO TB12
100 mg | ORAL_TABLET | Freq: Two times a day (BID) | ORAL | 0 refills | Status: AC
Start: 2022-06-06 — End: 2022-06-09

## 2022-06-06 NOTE — Telephone Encounter
Appointment Accommodation Request      Appointment Type: New     Reason for sooner request: 2 weeks after hospital visit.     Date/Time Requested (If any):     Last seen by MD:     Any Symptoms:  []  Yes  [x]  No      If yes, what symptoms are you experiencing:   Duration of symptoms (how long):     Patient or caller was offered an appointment but declined.    Patient or caller was advised to seek emergency services if conditions are urgent or emergent.    Patient or caller has been notified of the turnaround time of 1-2 business (days).

## 2022-06-06 NOTE — ED Notes
AVS was reviewed and discussed. Pt expressed understanding.

## 2022-06-06 NOTE — Discharge Instructions
Emergency Department Discharge Instructions      Summary of your visit  You have been evaluated in the [facility] Emergency Department today for jaw pain.  Your evaluation included a physical exam. We have placed a referral to a specialist.   We have observed you in the ER and have determined that you are stable for discharge at this time.    Follow-Up  Please follow up with your primary care physician within three days after being discharged from the ER - you can call to schedule an appointment.  You can find a primary care physician at [facility] by calling [phone].    If you do not have a Primary Care Physician, please call your insurance company or you may call [phone] to establish care with a [facility] physician.  If you are uninsured, please call 2-1-1 to find a free or low-cost clinic in your area. 2-1-1 [place] is the central source for providing information and referrals for all health and human services in [facility] [place]. Our 2-1-1 phone line is open 24 hours, 7 days a week, with trained [employer] prepared to offer help with any situation, any time. Our community services go far beyond phone referrals - explore our website to learn more. If you are calling from outside [facility] or cannot directly dial 2-1-1, you can call [phone].      Return to the Emergency Department if you experience:  Fevers 100.4 ?F or greater  Worsening or uncontrolled pain  Inability to open the mouth  Inability to tolerate food or fluids by mouth  Persistent nausea and vomiting  Chest pain  Shortness of breath  Numbness or weakness  Fainting  Any other concerning symptoms     Thank you for choosing [facility] for your care. It was a pleasure taking part in your care today, and we wish you the best!

## 2022-06-06 NOTE — ED Notes
[facility]?NOTIFICATION?06/05/2022 20:45?Sandra Bush, Sandra Bush?MRN: [id number]    Criteria Met      2 Visits in 30 Days    Security and Safety  No Security Events were found.  ED Care Guidelines  There are currently no ED Care Guidelines for this patient. Please check your facility's medical records system.          Prescription Drug Report (12 Mo.)  Rx Details  Fill Date Drug Description Qty. Prescriber   2022-06-04 ACETAMINOPHEN, CODEINE PHOSPHATE/300-30 MG/TAB 16 MCDONOUGH, SHAELAN, D., DMD MCDONOUGH, SHAELAN, D., DMD      Rx Summary  Metric Count   Quantity Dispensed 16   Unique Prescribers 1   Unique Pharmacies 1       Bush.D. Visit Count (12 mo.)  Facility Visits   [facility] [place] 1   [address] 1   Total 2   Note: Visits indicate total known visits.     Recent Emergency Department Visit Summary  Date Facility [facility] Type Diagnoses or Chief Complaint    Jun 05, 2022  [facility]  [place]  Emergency     Jun 05, 2022  [facility] [place]  [place]  Emergency      Jaw pain      jaw pain        Recent Inpatient Visit Summary  No Recent Inpatient Visits were found.  Care Team  No Care Team was found.  [facility]  This patient has registered at the [facility] Emergency Department  For more information visit: [email or web address]   PLEASE NOTE:     1.   Any care recommendations and other clinical information are provided as guidelines or for historical purposes only, and providers should exercise their own clinical judgment when providing care.    2.   You may only use this information for purposes of treatment, payment or health care operations activities, and subject to the limitations of applicable [facility] Policies.    3.   You should consult directly with the organization that provided a care guideline or other clinical history with any questions about additional information or accuracy or completeness of information provided.    ? 2024 [facility] - www.[email or web address]

## 2022-06-06 NOTE — ED Provider Notes
[facility]  Emergency Department Service Report    Triage   Arrived at 8:45 PM  Arrived by Walk-in [14]    ED Triage Vitals [06/05/22 2051]   Temp Temp src BP Heart Rate Resp SpO2 O2 Device Pain Score Weight   36 ?C (96.8 ?F) -- 119/81 74 18 97 % None (Room air) Ten 55 kg (121 lb 4.1 oz)       Chief Complaint   Patient presents with    Jaw Pain     Jaw pain x4 days. Denies trauma, face is symmetrical. Seen by specialists and feels she is not getting a correct diagnosis. Painful chewing, denies popping sensation, ''shooting'' pain down her right ear to her chin. Pt crying in triage due to pain.         Comprehensive Exam Initiated  Contact Date: 06/05/22  Contact Time: 2206    History     Sandra Bush, a 32 y.o. female, presents with jaw pain x 4 days. Patient reports right jaw pain with chewing, now radiating down her right ear and towards her chin. She has seen multiple doctors including in urgent care, telehealth, dentist, and orthodontist and has trialed ibuprofen 600, gabapentin, muscle relaxants, and toridol without any relief. She reports some relief with combination of cannabis gummy and ibuprofen, which allowed her to sleep through the night, however her pain returned by the morning. She reports decreased PO intake due to jaw pain with both solid foods as well as softer foods and fluids. No recent illness. No trauma to jaw. No history of similar.     Pertinent family history: None  Records reviewed: Triage note and vitals      ROS:  A 10 point review of systems was performed and was negative except as documented in the above HPI        Physical Exam:    Vital signs reviewed  General:  Well-appearing in no acute distress   HEENT:  Normocephalic, atraumatic. No skin changes. Right TM clear, external auditory canal clear. No focal tenderness, no focal swelling. Good dentition.  Eyes:  Extraocular eye movements grossly intact, no scleral icterus.  Cardiovascular:  Strong radial pulse, regular rhythm.   Respiratory:  Normal and nonlabored breathing.   Abdomen:  Nondistended.   Musculoskeletal:  No deformities or edema.  Neurological: Moving all 4 extremities with no apparent focal neurologic deficits.  Normal gait.  Psychiatric:  Normal mood and affect, interacting appropriately with staff.  Skin:  No apparent rashes or erythema.      MDM:    Sandra Bush, a 32 y.o. female with right jaw pain x4 days .On arrival, patient is AF, VSS and in no acute distress. Physical exam with        Additional MDM:  Review of external records: none  Discussion with independent historian: none  Chronic conditions affecting care: As above  Discussion with other healthcare professional: none  Social determinants of health: none      Progress Notes        Additional past medical history:    History reviewed. No pertinent past medical history.     History reviewed. No pertinent surgical history.     Laboratory Results     Labs Reviewed - No data to display     Imaging Results     No orders to display       Any ED procedures performed are documented on separate ED procedure notes.  Nursing Triage Note _    Sandra Bush, a 32 y.o. female, presents with Jaw Pain (Jaw pain x4 days. Denies trauma, face is symmetrical. Seen by specialists and feels she is not getting a correct diagnosis. Painful chewing, denies popping sensation, ''shooting'' pain down her right ear to her chin. Pt crying in triage due to pain. )      Clinical Impression     1. Jaw pain    2. Trigeminal neuralgia        Disposition and Follow-up   Disposition: Discharge [1]      No future appointments.    Follow up with:  Ulanda Edison, MD  [address]  [address]  [place] [address]  [phone]            Return precautions are specified on After Visit Summary.    Discharge Medication List as of 06/05/2022 10:49 PM        START taking these medications    Details   carBAMazepine 100 mg 12 hr tablet Take 1 tablet (100 mg total) by mouth two (2) times daily for 28 days., Starting Wed 06/05/2022, Until Wed 07/03/2022, Normal             Orders Placed This Encounter    carBAMazepine 100 mg 12 hr tablet     Scribe Signature   I, Earlean Polka , have acted as a Stage manager for patient Sandra Bush on behalf of Dr. Erline Hau at 06/05/2022 at 10:06 PM. All documentation underwent a comprehensive review by the listed physician(s) and received their approval upon signing.      Resident Signature       Oren Section, MD  PGY4 Emergency Medicine  06/05/2022 11:46 PM

## 2022-06-07 NOTE — Telephone Encounter
Good afternoon     Name: Sandra Bush   MRN: [id number]    Appointment made with pcp on 3.11.2024 at 3pm with Dr. hopper   Appointment with neuro pending her accommodation request   Will receive call from Dr. Silverio Decamp office for apt   Called patient to confirm pcp apt patient confirmed [phone]   Confirmation letter will be mailed out for pcp     Thank you         [place]    d: [phone]  f: [phone]  e: [email or web address]        From: Kizzie Bane [email or web address]   Sent: Wednesday, June 05, 2022 10:43 PM  To: DOM 913-Staff [email or web address]  Cc: Care Coordination [facility] CMs [email or web address]  Subject: Expedited neurology appt    Hello,    Requesting Treatment Team:   Primary - Resident: Lynita Lombard., MD   Primary Attending :  Resa Miner., MD   Request:  Neurology  Name: Sandra Bush   MRN: [id number]   Insurance:   Payor: [place] / Plan: [place] E[place]FP / Product Type: PPO /    ED Chief complaint- Jaw Pain  Diagnosis:  .     Trigeminal neuralgia    Contact number:  [phone]   Time Frame:  Within a 2 week timeframe

## 2022-06-08 ENCOUNTER — Ambulatory Visit: Payer: BLUE CROSS/BLUE SHIELD

## 2022-06-08 ENCOUNTER — Inpatient Hospital Stay
Admit: 2022-06-08 | Discharge: 2022-06-09 | Disposition: A | Discharge status: Home / Self Care | Payer: BLUE CROSS/BLUE SHIELD | Source: Home / Self Care

## 2022-06-08 DIAGNOSIS — L03211 Cellulitis of face: Secondary | ICD-10-CM

## 2022-06-08 DIAGNOSIS — R220 Localized swelling, mass and lump, head: Secondary | ICD-10-CM

## 2022-06-08 LAB — Pregnancy Test,Blood: PREGNANCY TEST,BLOOD: NEGATIVE

## 2022-06-08 LAB — Electrolyte Panel: POTASSIUM: 3.7 mmol/L (ref 3.6–5.3)

## 2022-06-08 LAB — CREATININE: ESTIMATED GFR 2021 CKD-EPI: >89 mL/min/{1.73_m2} (ref 0.60–1.30)

## 2022-06-08 LAB — Glucose, Whole Blood: GLUCOSE, WHOLE BLOOD: 99 mg/dL (ref 65–99)

## 2022-06-08 LAB — CBC: MEAN CORPUSCULAR VOLUME: 89.6 fL (ref 79.3–98.6)

## 2022-06-08 MED ADMIN — KETOROLAC TROMETHAMINE 30 MG/ML IJ SOLN: 15 mg | INTRAVENOUS | @ 22:00:00 | Stop: 2022-06-08 | NDC 72266011801

## 2022-06-08 MED ADMIN — MORPHINE SULFATE 4 MG/ML IV/IJ SOLN (MULTI-GPI): 4 mg | INTRAVENOUS | @ 22:00:00 | Stop: 2022-06-08 | NDC 00641612501

## 2022-06-08 MED ADMIN — ACETAMINOPHEN 500 MG PO TABS: 1000 mg | ORAL | @ 22:00:00 | Stop: 2022-06-08 | NDC 00904673061

## 2022-06-08 MED ADMIN — ONDANSETRON HCL 4 MG/2ML IJ SOLN: 4 mg | INTRAVENOUS | @ 22:00:00 | Stop: 2022-06-08 | NDC 60505613000

## 2022-06-08 MED ADMIN — SODIUM CHLORIDE 0.9 % IV BOLUS: 1000 mL | INTRAVENOUS | @ 23:00:00 | Stop: 2022-06-08 | NDC 00338004904

## 2022-06-08 NOTE — ED Notes
[facility]?NOTIFICATION?06/08/2022 11:02?Sandra Bush, WOODMANSEE E?MRN: [id number]    Criteria Met      2 Visits in 30 Days    Security and Safety  No Security Events were found.  ED Care Guidelines  There are currently no ED Care Guidelines for this patient. Please check your facility's medical records system.          Prescription Drug Report (12 Mo.)  Rx Details  Fill Date Drug Description Qty. Prescriber   2022-06-04 ACETAMINOPHEN, CODEINE PHOSPHATE/300-30 MG/TAB 16 MCDONOUGH, SHAELAN, D., DMD MCDONOUGH, SHAELAN, D., DMD      Rx Summary  Metric Count   Quantity Dispensed 16   Unique Prescribers 1   Unique Pharmacies 1       E.D. Visit Count (12 mo.)  Facility Visits   [facility] [place] 2   [facility] 1   Total 3   Note: Visits indicate total known visits.     Recent Emergency Department Visit Summary  Date Facility [facility] Type Diagnoses or Chief Complaint    Jun 08, 2022  [name]  Emergency     Jun 05, 2022  [facility]  [place]  [place]      1. Jaw pain      1. Jaw Pain      2. Trigeminal neuralgia      Jun 05, 2022  [facility] [place]  [place]  [place]gency      Jaw pain      jaw pain        Recent Inpatient Visit Summary  No Recent Inpatient Visits were found.  Care Team  No Care Team was found.  [facility]  This patient has registered at the [facility] Emergency Department  For more information visit: [email or web address]   PLEASE NOTE:     1.   Any care recommendations and other clinical information are provided as guidelines or for historical purposes only, and providers should exercise their own clinical judgment when providing care.    2.   You may only use this information for purposes of treatment, payment or health care operations activities, and subject to the limitations of applicable [facility] Policies.    3.   You should consult directly with the organization that provided a care guideline or other clinical history with any questions about additional information or accuracy or completeness of information provided.    ? 2024 [facility] - www.[email or web address].com

## 2022-06-08 NOTE — ED Provider Notes
Sandra Bush [facility]  Emergency Department Service Report    Triage     Sandra Bush, a 32 y.o. female, presents with Jaw Pain (Right jaw pain since 06/02/22 went to dentist and [place]. Dentist cleared her. Went to specialist here on 06/05/22. Major swelling that started yesterday and its increasing in size.  Hx of trigeminal neurology  MRI yesterday results: ''vessel impinging on the cisternal segment of the left cranial nerve approximately 2.66mm ventral root entry zone''. Pta  this am received DepoMedrol IM 80mg  and Tylenol with codeine. )    Arrived on 06/08/2022 at 11:02 AM   Arrived by Walk-in [14]    ED Triage Vitals   Temp Temp Source BP Heart Rate Resp SpO2 O2 Device Pain Score Weight   06/08/22 1113 06/08/22 1113 06/08/22 1113 06/08/22 1113 06/08/22 1113 06/08/22 1113 06/08/22 1109 06/08/22 1109 06/08/22 1109   37.3 ?C (99.1 ?F) Temporal 97/65 (!) 102 18 99 % None (Room air) Eight 54.9 kg (121 lb)       Allergies   Allergen Reactions    Amoxicillin-Pot Clavulanate         Initial Physician Contact       Comprehensive Exam Initiated  Contact Date: 06/08/22  Contact Time: 1341    History   HPI  32 year old female with past medical history of TMJ, recently diagnosed trigeminal neuralgia presents to ER for right-sided facial swelling and jaw pain.  Patient notes right-sided jaw pain for the past 8 days, was evaluated 4 days ago in the [facility] ER for similar symptoms, diagnosed with trigeminal neuralgia, discharged on carbamazepine.  Patient received outpatient MRI earlier today, they noted compression of cranial nerve V consistent with trigeminal neuralgia.  Patient also took 1st dose of carbamazepine earlier today, 20 minutes later noted some lip tingling that self resolved after 30 minutes.  Patient also received IM dose of Depo-Medrol this morning in clinic.  Patient reports a few hours later, developed gradually worsening painful right cheek and jaw swelling.  Patient denies history of similar symptoms.  Patient reports that prescribed Tylenol with codeine has not been helping with pain.  Patient has tolerated gabapentin in the past, felt it helped with pain a little bit.  Patient has been evaluated by to dentist in the past, no right-sided dental pathology. Patient denies other symptoms including fevers, chills, headache, dizziness, sore throat, cough, shortness of breath, chest pain, abdominal pain, nausea, vomiting, diarrhea, urinary or bowel symptoms, or rash.        Past Medical History:   Diagnosis Date    Trigeminal neuralgia         History reviewed. No pertinent surgical history.     Past Family History   family history is not on file.     Past Social History   she reports that she has never smoked. She has never used smokeless tobacco. She reports that she does not drink alcohol and does not use drugs. No history on file for sexual activity.       Physical Exam   Physical Exam  Vitals and nursing note reviewed.   Constitutional:       General: She is not in acute distress.     Appearance: Normal appearance. She is not ill-appearing, toxic-appearing or diaphoretic.      Comments: Ambulatory without assistance, seated in bed comfortably, speaking in full sentences, following commands.   HENT:      Head: Normocephalic and atraumatic.  Right Ear: Tympanic membrane and external ear normal.      Left Ear: Tympanic membrane and external ear normal.      Nose: No congestion or rhinorrhea.      Mouth/Throat:      Comments: Right-sided cheek and lower jaw with significant swelling, tenderness to palpation, mild warmth, no significant erythema.  Left side of face unremarkable.  No significant swelling above the right maxilla.  No intraoral swelling, purulence, erythema.  No pain to percussion along right sided molars.  No PTA, uvular deviation.  No floor of mouth tenderness or swelling.  Eyes:      General: No scleral icterus.        Right eye: No discharge.         Left eye: No discharge. Conjunctiva/sclera: Conjunctivae normal.   Neck:      Comments: Full range of motion without pain.  No significant neck swelling.  Cardiovascular:      Rate and Rhythm: Normal rate and regular rhythm.      Pulses: Normal pulses.   Pulmonary:      Effort: Pulmonary effort is normal. No respiratory distress.      Breath sounds: No stridor. No rhonchi.   Abdominal:      General: There is no distension.      Palpations: Abdomen is soft.   Skin:     General: Skin is warm and dry.      Capillary Refill: Capillary refill takes less than 2 seconds.   Neurological:      General: No focal deficit present.      Mental Status: She is alert and oriented to person, place, and time.           Medical Decision Making   [name] is a 32 y.o. female with past medical history of TMJ, recently diagnosed trigeminal neuralgia presents to ER for right-sided facial swelling and jaw pain.  Patient with normal vitals, exam notable for large right-sided facial swelling.  Bedside ultrasound with mild cobblestoning, no clear fluid collection.  CT of face without clear glandular swelling, abscess, only notable for right facial tissue swelling and reactive lymph node.  WBC also elevated to 15.3, no priors for comparison.  Temperature here today 37.8 C orally.  Given patient with near fever, right-sided facial swelling with mild warmth, concern for cellulitis, soft tissue infection, however no clear source given negative dental workup previously.  No concern for PTA, RPA, Ludwig's given physical examination.  Will cover oral anaerobes and strep with clindamycin (patient allergic to amoxicillin clavulanate), 1st dose given here in ER without any adverse reaction.  Given patient's swelling started after 1st dose of carbamazepine, will recommend patient avoid carbamazepine, begin taking gabapentin for trigeminal neuralgia.  Also transition patient's pain medications to scheduled Tylenol/ibuprofen, p.r.n. oxycodone to maximize multimodal pain control.  Will discharge home with PCP follow-up for symptom recheck, strict return precautions.  Patient and boyfriend at bedside expresses understanding and agreement with plan of care.    Medical Decision Making  Amount and/or Complexity of Data Reviewed  Labs: ordered. Decision-making details documented in ED Course.  Radiology: ordered. Decision-making details documented in ED Course.    Risk  OTC drugs.  Prescription drug management.            Progress Notes / Reassessments     ED Course as of 06/09/22 0817   Sat Jun 08, 2022   1447 White Blood Cell Count(!): 15.37 [KK]   1625 CT  face w contrast  IMPRESSION:  1.  Extensive right facial soft tissue swelling without drainable peripherally enhancing collection identified to suggest abscess.  2.  Asymmetric prominence of a right level 1B lymph node is thought to be reactive. [KK]      ED Course User Index  [KK] Arsenio Loader., MD             ED Course      Laboratory Results     Labs Reviewed   CBC - Abnormal; Notable for the following components:       Result Value    White Blood Cell Count 15.37 (*)     Red Blood Cell Count 3.75 (*)     Hematocrit 33.6 (*)     All other components within normal limits   ELECTROLYTE PANEL - Abnormal; Notable for the following components:    Sodium 132 (*)     Chloride 93 (*)     All other components within normal limits   CREATININE,WHOLE BLOOD - Abnormal; Notable for the following components:    Creatinine 0.54 (*)     All other components within normal limits   PREGNANCY TEST,BLOOD - Normal   GLUCOSE - Normal       Imaging Results     CT face w contrast   Final Result by Lance Coon., MD (03/09 1614)   IMPRESSION:      1.  Extensive right facial soft tissue swelling without drainable peripherally enhancing collection identified to suggest abscess.   2.  Asymmetric prominence of a right level 1B lymph node is thought to be reactive.                  Signed by: Lajuana Matte   06/08/2022 4:14 PM          Consults     Consult Orders Placed This Encounter       None            Clnical Impression     1. Jaw swelling    2. Facial cellulitis          Disposition and Follow-up   Disposition: Discharge [1]     Future Appointments   Date Time Provider Department Center   11/18/2022  1:00 PM Tacy Learn., MD NEU [facility] [place]       Follow up with:  [facility]  [address]  [place] [address]  [phone]  Go to   As needed, If symptoms worsen    Ulanda Edison, MD  [address]  [address]  [place] [address]  [phone]    Schedule an appointment as soon as possible for a visit in 2 days        Return precautions are specified on After Visit Summary.    Discharge Medication List as of 06/08/2022  6:04 PM        START taking these medications    Details   clindamycin 150 mg capsule Take 3 capsules (450 mg total) by mouth three (3) times daily for 7 days., Starting Sat 06/08/2022, Until Sat 06/15/2022, Normal      gabapentin 300 mg capsule Take 1 capsule (300 mg total) by mouth three (3) times daily., Starting Sat 06/08/2022, Normal      oxyCODONE 5 mg tablet Take 1 tablet (5 mg total) by mouth every six (6) hours as needed for Severe Pain (Pain Scale 7-10). Max Daily  Amount: 20 mg, Starting Sat 06/08/2022, Normal             Medications Administered This Encounter           Status .     gabapentin cap 300 mg  Once         Last MAR action: Given      clindamycin cap 450 mg  Once         Last MAR action: Given      iohexol (Omnipaque) 350 mg/mL inj 100 mL  Once         Last MAR action: Given      sodium chloride 0.9% IV soln bolus 1,000 mL  Once         Last MAR action: Stopped      acetaminophen tab 1,000 mg  Once         Last MAR action: Given      ketorolac 30 mg/mL inj 15 mg  STAT         Last MAR action: Given      morphine 4 mg/mL inj 4 mg  Once         Last MAR action: Given      ondansetron 4 mg/2 mL inj 4 mg  Once         Last MAR action: Given               Resident Signature Arsenio Loader., MD  Resident  06/09/22 [phone]

## 2022-06-09 MED ORDER — CLINDAMYCIN HCL 150 MG PO CAPS
450 mg | ORAL_CAPSULE | Freq: Three times a day (TID) | ORAL | 0 refills | Status: AC
Start: 2022-06-09 — End: 2022-06-09

## 2022-06-09 MED ORDER — OXYCODONE HCL 5 MG PO TABS
5 mg | ORAL_TABLET | Freq: Four times a day (QID) | ORAL | 0 refills | Status: AC | PRN
Start: 2022-06-09 — End: ?

## 2022-06-09 MED ORDER — OXYCODONE HCL 5 MG PO TABS
5 mg | ORAL_TABLET | Freq: Four times a day (QID) | ORAL | 0 refills | Status: AC | PRN
Start: 2022-06-09 — End: 2022-06-09

## 2022-06-09 MED ORDER — GABAPENTIN 300 MG PO CAPS
300 mg | ORAL_CAPSULE | Freq: Three times a day (TID) | ORAL | 0 refills | Status: AC
Start: 2022-06-09 — End: ?

## 2022-06-09 MED ORDER — CLINDAMYCIN HCL 150 MG PO CAPS
450 mg | ORAL_CAPSULE | Freq: Three times a day (TID) | ORAL | 0 refills | Status: AC
Start: 2022-06-09 — End: ?

## 2022-06-09 MED ORDER — GABAPENTIN 300 MG PO CAPS
300 mg | ORAL_CAPSULE | Freq: Three times a day (TID) | ORAL | 0 refills | Status: AC
Start: 2022-06-09 — End: 2022-06-09

## 2022-06-09 MED ADMIN — IOHEXOL 350 MG/ML IV SOLN: 100 mL | INTRAVENOUS | Stop: 2022-06-09 | NDC 00407141491

## 2022-06-09 MED ADMIN — GABAPENTIN 300 MG PO CAPS: 300 mg | ORAL | @ 02:00:00 | Stop: 2022-06-09 | NDC 60687059111

## 2022-06-09 MED ADMIN — CLINDAMYCIN HCL 150 MG PO CAPS: 450 mg | ORAL | @ 01:00:00 | Stop: 2022-06-09 | NDC 00904595961

## 2022-06-09 NOTE — Discharge Instructions
You have been evaluated in the [facility] Emergency Department today for a skin infection. Please take your prescribed antibiotics as directed for the full course of the medication.    We recommend you take 600mg  ibuprofen (generic advil or motrin) every 8 hours or acetaminophen (generic tylenol) 1000mg  every 8 hours as needed for pain. If needed, you can alternate these medications so that you take one medication every 4 hours. For instance, at noon take ibuprofen, then at 4pm take acetaminophen, then at 8pm take ibuprofen.    We also recommend you take gabapentin to reduce your nerve pain. For pain not well-controlled with those medications, you may take the prescribed oxycodone as needed for severe breakthrough pain. We recommend against taking your previously prescribed codeine while taking oxycodone as these medications may cross-react and you may overdose on opioids.    Please schedule an appointment for follow up with your primary care physician as soon as possible. Call [phone] to schedule an appointment with a [place] primary care physician.    Return to the Emergency Department if you experience recurrent vomiting, fevers greater than 100.2F, increase in area of redness, warmth around the area, foul smelling discharge from the area, increased tenderness around the area, or any other concerning symptoms.    Thank you for choosing  for your care.    Labs Reviewed   CBC - Abnormal; Notable for the following components:       Result Value    White Blood Cell Count 15.37 (*)     Red Blood Cell Count 3.75 (*)     Hematocrit 33.6 (*)     All other components within normal limits   ELECTROLYTE PANEL - Abnormal; Notable for the following components:    Sodium 132 (*)     Chloride 93 (*)     All other components within normal limits   CREATININE,WHOLE BLOOD - Abnormal; Notable for the following components:    Creatinine 0.54 (*)     All other components within normal limits   PREGNANCY TEST,BLOOD - Normal GLUCOSE - Normal         CT face w contrast   Final Result by Lance Coon., MD (03/09 1614)   IMPRESSION:      1.  Extensive right facial soft tissue swelling without drainable peripherally enhancing collection identified to suggest abscess.   2.  Asymmetric prominence of a right level 1B lymph node is thought to be reactive.                  Signed by: Lajuana Matte   06/08/2022 4:14 PM

## 2022-06-10 NOTE — Telephone Encounter
Any available to offer patient ?

## 2022-06-13 NOTE — Telephone Encounter
Update   Good afternoon     Name: Sandra Bush   MRN: [id number]    Neuro- 3.20.2024 at 4pm with Dr. Randal Buba., MD  [facility]  [address] [place], [place], [place] [address]  [phone]  *Office called patient no make appointment per accommodation request that was routed to neuro intake line     Called patient at [phone] left voicemail to confirm appointment will call back to confirm   New confirmation letter will be mailed to patient     Thank you       [place]    d: [phone]  f: [phone]  e: [email or web address]

## 2022-06-18 ENCOUNTER — Telehealth: Payer: BLUE CROSS/BLUE SHIELD

## 2022-06-18 ENCOUNTER — Ambulatory Visit: Payer: BLUE CROSS/BLUE SHIELD

## 2022-06-18 NOTE — Telephone Encounter
Update   Good afternoon     Name: Sandra Bush   MRN: [id number]    [place] patient at [phone] left voicemail to confirm appointment will call back to confirm      [place]    d: [phone]  f: [phone]  e: [email or web address]

## 2022-06-18 NOTE — Telephone Encounter
Called patient left message regarding her appointment tomorrow 3/20 needs to be rescheduled. Asked her to call back.

## 2022-06-19 ENCOUNTER — Ambulatory Visit: Payer: BLUE CROSS/BLUE SHIELD

## 2022-06-20 ENCOUNTER — Telehealth: Payer: BLUE CROSS/BLUE SHIELD

## 2022-06-20 DIAGNOSIS — G500 Trigeminal neuralgia: Secondary | ICD-10-CM

## 2022-06-20 NOTE — Consults
PATIENT: Sandra Bush   MRN: [id number]  DOB: December 26, 1990  DATE OF SERVICE: 06/20/2022     Neurosurgery Consultation Note    CC:   1. Right facial pain    2. Trigeminal neuralgia         HPI:  Sandra Bush  is a 32 y.o. female with past medical history of right sided TMJ, recently diagnosed trigeminal neuralgia presented to ED for right-sided facial swelling, gum swelling, and upper jaw/tooth pain. While admitted, the patient obtained CT of face without clear glandular swelling, abscess, only notable for right facial tissue swelling and reactive lymph node. WBC was elevated at 15.3 (no prior for comparison), and temperature was 37.8 C orally. As she presented with near fever, right-sided facial swelling with mild warmth, there was concern for cellulitis or soft tissue infection, however no clear source given negative dental workup previously. Given patient's swelling started after 1st dose of Carbamazepine, she was instead advised to discontinue Carbamazepine, begin taking Gabapentin 300 mg TID for management of suspected trigeminal neuralgia. I had the pleasure of speaking with the patient via telemedicine conference for further evaluation and discussion of treatment options. Today, she reports that pain resolved after one week, and she is now doing well overall, denying any recurrence of facial pain/swelling.       Past Medical History:   Past Medical History:   Diagnosis Date    Trigeminal neuralgia        Past Surgical History:  No past surgical history on file.    Allergies:   Amoxicillin-pot clavulanate    Social History:   Social History     Tobacco Use    Smoking status: Never    Smokeless tobacco: Never   Vaping Use    Vaping Use: Never used   Substance Use Topics    Alcohol use: Never    Drug use: Never        Family History:   No family history on file.    Physical Exam:   Ht 5' 3'' (1.6 m)  ~ Wt 110 lb 3.7 oz (50 kg)  ~ LMP 05/09/2022 (Exact Date)  ~ BMI 19.53 kg/m?   General: No acute distress.  Neurological: Awake, attentive and interactive.  Language: Speech production and comprehension intact    Medications  Medications that the patient states to be currently taking   Medication Sig    gabapentin 300 mg capsule Take 1 capsule (300 mg total) by mouth three (3) times daily.       Assessment:   1.Right facial pain:    Given reported symptoms of transient right facial pain and swelling, I discussed that symptoms do not appear consistent with trigeminal neuralgia, and advised the patient to consult with [facility] for further evaluation and management. In the interim, as the patient is reportedly taking Gabapentin 300 mg TID, I advised her to taper off medication pending evaluation with Neurology, decreasing dose to 300 mg BID for 5 days, then decrease to 300 mg once daily for 5 days, then discontinue medication. She can otherwise return for follow up as needed in the event she develops new or worsening neurological symptoms, or has exhausted conservative management of facial pain with Neurologist. The patient has expressed an understanding and agreement of the plan. All questions were answered.    PLAN:   -Referral to [facility] for further evaluation and management of right facial pain.  -Taper off of Gabapentin as discussed  Follow up:prn     Please note, I spent a total of 30 minutes in face-to-face consultation with this patient, over half of which was spent counseling the patient and coordinating care.    SCRIBE SIGNATURE(S):   I, Arbor Cohen Margretta Ditty, have assisted Dia Sitter., MD with the documentation for Dannilynn Gallina on 06/20/2022 at 8:59 AM.    PHYSICIAN SIGNATURE(S):  Dia Sitter., MD  06/20/2022 8:59 AM    I have reviewed this note, written by Jerolyn Shin and attest that it is an accurate representation of my H & P and other events of the outpatient visit except if otherwise noted.

## 2022-06-21 DIAGNOSIS — R519 Right facial pain: Secondary | ICD-10-CM

## 2022-06-24 ENCOUNTER — Ambulatory Visit: Payer: BLUE CROSS/BLUE SHIELD

## 2022-06-28 ENCOUNTER — Emergency Department (HOSPITAL_COMMUNITY): Payer: Medicaid Other

## 2022-06-28 ENCOUNTER — Other Ambulatory Visit: Payer: Self-pay

## 2022-06-28 ENCOUNTER — Emergency Department (HOSPITAL_COMMUNITY)
Admission: EM | Admit: 2022-06-28 | Discharge: 2022-06-28 | Disposition: A | Discharge status: Home / Self Care | Payer: Medicaid Other | Attending: Emergency Medicine | Admitting: Emergency Medicine

## 2022-06-28 DIAGNOSIS — H5702 Anisocoria: Secondary | ICD-10-CM | POA: Diagnosis not present

## 2022-06-28 DIAGNOSIS — S0083XA Contusion of other part of head, initial encounter: Secondary | ICD-10-CM | POA: Diagnosis not present

## 2022-06-28 DIAGNOSIS — S0990XA Unspecified injury of head, initial encounter: Secondary | ICD-10-CM | POA: Diagnosis present

## 2022-06-28 NOTE — ED Notes (Signed)
Patient transported to CT 

## 2022-06-28 NOTE — ED Notes (Signed)
AVS reviewed with pt prior to discharge. Pt verbalizes understanding. Belongings with pt upon depart. Ambulatory to POV with significant other. 
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
## 2022-06-28 NOTE — Discharge Instructions (Signed)
Please follow up with your doctor Return if having any worsening symptoms
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
## 2022-07-09 ENCOUNTER — Telehealth: Payer: Medicaid Other | Admitting: Physician Assistant

## 2022-07-09 DIAGNOSIS — R112 Nausea with vomiting, unspecified: Secondary | ICD-10-CM

## 2022-07-09 DIAGNOSIS — R82998 Other abnormal findings in urine: Secondary | ICD-10-CM

## 2022-07-09 DIAGNOSIS — K219 Gastro-esophageal reflux disease without esophagitis: Secondary | ICD-10-CM

## 2022-07-09 NOTE — Patient Instructions (Signed)
  [name], thank you for joining Margaretann Loveless, PA-C for today's virtual visit.  While this provider is not your primary care provider (PCP), if your PCP is located in our provider database this encounter information will be shared with them immediately following your visit.   A [facility] MyChart account gives you access to today's visit and all your visits, tests, and labs performed at [facility] " click here if you don't have a [facility] MyChart account or go to [email or web address]  Consent: (Patient) Patty Mata provided verbal consent for this virtual visit at the beginning of the encounter.  Current Medications:  Current Outpatient Medications:    doxycycline (VIBRAMYCIN) 100 MG capsule, Take 1 capsule (100 mg total) by mouth 2 (two) times daily., Disp: 60 capsule, Rfl: 2   fluticasone (FLONASE) 50 MCG/ACT nasal spray, Place 2 sprays into both nostrils daily., Disp: 16 g, Rfl: 6   Medications ordered in this encounter:  No orders of the defined types were placed in this encounter.    *If you need refills on other medications prior to your next appointment, please contact your pharmacy*  Follow-Up: Call back or seek an in-person evaluation if the symptoms worsen or if the condition fails to improve as anticipated.  [facility] [phone]   If you have been instructed to have an in-person evaluation today at a local Urgent Care facility, please use the link below. It will take you to a list of all of our available [facility], including address, phone number and hours of operation. Please do not delay care.  [facility]  If you or a family member do not have a primary care provider, use the link below to schedule a visit and establish care. When you choose a [facility] primary care physician or advanced practice provider, you gain a long-term partner in health. Find a Primary Care Provider  Learn more  about [facility]'s in-office and virtual care options: [facility] - Get Care Now

## 2022-07-09 NOTE — Progress Notes (Signed)
Virtual Visit Consent   Patty Mata, you are scheduled for a virtual visit with a [facility] provider today. Just as with appointments in the office, your consent must be obtained to participate. Your consent will be active for this visit and any virtual visit you may have with one of our providers in the next 365 days. If you have a MyChart account, a copy of this consent can be sent to you electronically.  As this is a virtual visit, video technology does not allow for your provider to perform a traditional examination. This may limit your provider's ability to fully assess your condition. If your provider identifies any concerns that need to be evaluated in person or the need to arrange testing (such as labs, EKG, etc.), we will make arrangements to do so. Although advances in technology are sophisticated, we cannot ensure that it will always work on either your end or our end. If the connection with a video visit is poor, the visit may have to be switched to a telephone visit. With either a video or telephone visit, we are not always able to ensure that we have a secure connection.  By engaging in this virtual visit, you consent to the provision of healthcare and authorize for your insurance to be billed (if applicable) for the services provided during this visit. Depending on your insurance coverage, you may receive a charge related to this service.  I need to obtain your verbal consent now. Are you willing to proceed with your visit today? Patty Mata has provided verbal consent on 07/09/2022 for a virtual visit (video or telephone). Margaretann Loveless, PA-C  Date: 07/09/2022 7:52 AM  Virtual Visit via Video Note   I, Margaretann Loveless, connected with  Patty Mata  ([id number], 04/12/1990) on 07/09/22 at  7:45 AM EDT by a video-enabled telemedicine application and verified that I am speaking with the correct person using two identifiers.  Location: Patient: Virtual Visit Location Patient:  Home Provider: Virtual Visit Location Provider: Home Office   I discussed the limitations of evaluation and management by telemedicine and the availability of in person appointments. The patient expressed understanding and agreed to proceed.    History of Present Illness: Patty Mata is a 32 y.o. who identifies as a female who was assigned female at birth, and is being seen today for nausea over 6 months and a possible UTI.  HPI: GI Problem The primary symptoms include nausea and vomiting. Primary symptoms do not include fever. The illness began more than 7 days ago (6 months). The onset was gradual. The problem has not changed since onset. The illness is also significant for bloating. Associated symptoms comments: Brash taste in throat, burping.  Urinary Tract Infection  This is a new problem. The current episode started more than 1 month ago. The problem occurs every urination. The problem has been unchanged. There has been no fever. Associated symptoms include frequency, nausea and vomiting. Pertinent negatives include no hematuria. Associated symptoms comments: Dark and hazy. She has tried nothing for the symptoms. The treatment provided no relief.     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   Alcohol-induced mood disorder    Cholestasis 03/03/2018   Elevated liver function tests 02/19/2018   No leakage of amniotic fluid into vagina 01/07/2018   Rubella non-immune status, antepartum 11/04/2017  Frequent UTI 10/30/2017   Anxiety and depression 10/30/2017    Allergies:  Allergies  Allergen Reactions   Penicillins Rash    Has patient had a PCN reaction causing immediate rash, facial/tongue/throat swelling, SOB or lightheadedness with hypotension: Yes Has patient had a PCN reaction causing severe rash involving mucus membranes or skin  necrosis: No Has patient had a PCN reaction that required hospitalization: No Has patient had a PCN reaction occurring within the last 10 years: No If all of the above answers are "NO", then may proceed with Cephalosporin use.    Medications:  Current Outpatient Medications:    doxycycline (VIBRAMYCIN) 100 MG capsule, Take 1 capsule (100 mg total) by mouth 2 (two) times daily., Disp: 60 capsule, Rfl: 2   fluticasone (FLONASE) 50 MCG/ACT nasal spray, Place 2 sprays into both nostrils daily., Disp: 16 g, Rfl: 6  Observations/Objective: Patient is well-developed, well-nourished in no acute distress.  Resting comfortably at home.  Head is normocephalic, atraumatic.  No labored breathing.  Speech is clear and coherent with logical content.  Patient is alert and oriented at baseline.    Assessment and Plan: 1. Gastroesophageal reflux disease without esophagitis  2. Nausea and vomiting, unspecified vomiting type  3. Dark urine  - Suspect GERD and possible UTI vs dehydration as source of issues, however, patient has concerns for possible kidney stone and other GI issues.  - Patient elects to be seen at in-person UC for further evaluation  Follow Up Instructions: I discussed the assessment and treatment plan with the patient. The patient was provided an opportunity to ask questions and all were answered. The patient agreed with the plan and demonstrated an understanding of the instructions.  A copy of instructions were sent to the patient via MyChart unless otherwise noted below.    The patient was advised to call back or seek an in-person evaluation if the symptoms worsen or if the condition fails to improve as anticipated.  Time:  I spent 10 minutes with the patient via telehealth technology discussing the above problems/concerns.    Margaretann Loveless, PA-C

## 2022-11-18 ENCOUNTER — Ambulatory Visit: Payer: BLUE CROSS/BLUE SHIELD | Attending: Student in an Organized Health Care Education/Training Program

## 2022-12-24 ENCOUNTER — Telehealth: Payer: MEDICAID

## 2022-12-24 ENCOUNTER — Telehealth: Payer: MEDICAID | Admitting: Physician Assistant

## 2022-12-24 DIAGNOSIS — A084 Viral intestinal infection, unspecified: Secondary | ICD-10-CM

## 2022-12-24 MED ORDER — ONDANSETRON 4 MG PO TBDP: 4.0000 mg | ORAL_TABLET | Freq: Three times a day (TID) | ORAL | 0 refills | Status: AC | PRN

## 2022-12-24 NOTE — Patient Instructions (Addendum)
[name], thank you for joining Piedad Climes, PA-C for today's virtual visit.  While this provider is not your primary care provider (PCP), if your PCP is located in our provider database this encounter information will be shared with them immediately following your visit.   A [facility] MyChart account gives you access to today's visit and all your visits, tests, and labs performed at [facility] " click here if you don't have a [facility] MyChart account or go to [email or web address]  Consent: (Patient) Patty Mata provided verbal consent for this virtual visit at the beginning of the encounter.  Current Medications:  Current Outpatient Medications:    fluticasone (FLONASE) 50 MCG/ACT nasal spray, Place 2 sprays into both nostrils daily., Disp: 16 g, Rfl: 6   Medications ordered in this encounter:  No orders of the defined types were placed in this encounter.    *If you need refills on other medications prior to your next appointment, please contact your pharmacy*  Follow-Up: Call back or seek an in-person evaluation if the symptoms worsen or if the condition fails to improve as anticipated.  [facility] [phone]  Other Instructions Please hydrate and rest. Use the zofran as directed to help with nausea and vomiting.  Keep a bland diet (see below). If symptoms are not resolving, anything worsens or you cannot keep fluids in with the medication, you need to be evaluated in person ASAP.  Here are some resources to get in with a GYN for your pap smear. *Center for [facility]             [address] [place], [place] [address] [phone] (*Take patients with no insurance)  *Center for [employer] at [facility] [facility] [address] [place],  [place]  [address] [phone] (*Take patients with no insurance)  Center for [employer] at [employer]                                                              [address], [address], [place], [place], [address] [phone]  Center for [facility] at [facility] [address], [place], [place], [address] [phone]  Center for [facility] at [facility] [address], [address], [place], [place], [address] [phone]  Center for [facility] at [facility]                                 [address] [place], [place], [place], [address] [phone]  Center for [facility] at [facility]                                    [address] [place], [place], [address] [phone]  Center for [facility] at [facility] [address], [place], [place], [address]  [facility] [address], [place], [place], [address] [phone]  If you have been instructed to have an in-person evaluation today at a local Urgent Care facility, please use the link below. It will take you to a list of all of our available [facility], including address, phone number and hours of operation. Please do not delay care.  [facility]  If you or a family member do not have a primary care provider, use the link below to schedule a visit and establish care. When you choose a [facility] primary care physician or advanced practice provider, you gain a long-term partner in health. Find a Primary Care Provider  Learn more about [facility]'s in-office and virtual care options: [facility] - Get Care Now

## 2022-12-24 NOTE — Progress Notes (Signed)
Virtual Visit Consent   SYLVIA MUKHOPADHYAY, you are scheduled for a virtual visit with a [facility] provider today. Just as with appointments in the office, your consent must be obtained to participate. Your consent will be active for this visit and any virtual visit you may have with one of our providers in the next 365 days. If you have a MyChart account, a copy of this consent can be sent to you electronically.  As this is a virtual visit, video technology does not allow for your provider to perform a traditional examination. This may limit your provider's ability to fully assess your condition. If your provider identifies any concerns that need to be evaluated in person or the need to arrange testing (such as labs, EKG, etc.), we will make arrangements to do so. Although advances in technology are sophisticated, we cannot ensure that it will always work on either your end or our end. If the connection with a video visit is poor, the visit may have to be switched to a telephone visit. With either a video or telephone visit, we are not always able to ensure that we have a secure connection.  By engaging in this virtual visit, you consent to the provision of healthcare and authorize for your insurance to be billed (if applicable) for the services provided during this visit. Depending on your insurance coverage, you may receive a charge related to this service.  I need to obtain your verbal consent now. Are you willing to proceed with your visit today? Patty Mata has provided verbal consent on 12/24/2022 for a virtual visit (video or telephone). Piedad Climes, [place]  Date: 12/24/2022 4:25 PM  Virtual Visit via Video Note   I, Piedad Climes, connected with  Patty Mata  ([id number], 05/06/1990) on 12/24/22 at  4:30 PM EDT by a video-enabled telemedicine application and verified that I am speaking with the correct person using two identifiers.  Location: Patient: Virtual Visit Location  Patient: Home Provider: Virtual Visit Location Provider: Home Office   I discussed the limitations of evaluation and management by telemedicine and the availability of in person appointments. The patient expressed understanding and agreed to proceed.    History of Present Illness: Patty Mata is a 32 y.o. who identifies as a female who was assigned female at birth, and is being seen today for a few days of chills, sweats, borderline fever (Tmax 100.2) with nausea, diarrhea and some episodes of non-bloody emesis. Also with cough. Notes trying to hydrate but hard to keep anything in. Denies melena or hematochezia. Denies recent travel. Co-worker with similar symptoms who she has been around. Has not tested for COVID yet. Tried to do a visit last night but could not get her MyChart to work for her to schedule.   HPI: HPI  Problems:  Patient Active Problem List   Diagnosis Date Noted   Substance induced mood disorder (HCC) 10/31/2019   Severe recurrent major depression without psychotic features (HCC) 10/26/2019   Alcohol-induced mood disorder (HCC)    Cholestasis 03/03/2018   Elevated liver function tests 02/19/2018   No leakage of amniotic fluid into vagina 01/07/2018   Rubella non-immune status, antepartum 11/04/2017   Frequent UTI 10/30/2017   Anxiety and depression 10/30/2017    Allergies:  Allergies  Allergen Reactions   Penicillins Rash    Has patient had a PCN reaction causing immediate rash, facial/tongue/throat swelling, SOB or lightheadedness with hypotension: Yes Has patient had a PCN  reaction causing severe rash involving mucus membranes or skin necrosis: No Has patient had a PCN reaction that required hospitalization: No Has patient had a PCN reaction occurring within the last 10 years: No If all of the above answers are "NO", then may proceed with Cephalosporin use.    Medications:  Current Outpatient Medications:    fluticasone (FLONASE) 50 MCG/ACT nasal spray, Place  2 sprays into both nostrils daily., Disp: 16 g, Rfl: 6  Observations/Objective: Patient is well-developed, well-nourished in no acute distress.  Resting comfortably at home.  Head is normocephalic, atraumatic.  No labored breathing. Speech is clear and coherent with logical content.  Patient is alert and oriented at baseline.   Assessment and Plan: 1. Viral gastroenteritis  Supportive measures and OTC medications reviewed. Start [employer] Zofran per orders. ER precautions reviewed.  Follow Up Instructions: I discussed the assessment and treatment plan with the patient. The patient was provided an opportunity to ask questions and all were answered. The patient agreed with the plan and demonstrated an understanding of the instructions.  A copy of instructions were sent to the patient via MyChart unless otherwise noted below.   The patient was advised to call back or seek an in-person evaluation if the symptoms worsen or if the condition fails to improve as anticipated.  Time:  I spent 10 minutes with the patient via telehealth technology discussing the above problems/concerns.    Piedad Climes, PA-C

## 2022-12-25 ENCOUNTER — Encounter: Payer: MEDICAID | Admitting: Physician Assistant

## 2022-12-25 NOTE — Progress Notes (Signed)
Erroneous

## 2023-03-18 ENCOUNTER — Encounter (HOSPITAL_COMMUNITY): Payer: Self-pay

## 2023-03-18 ENCOUNTER — Other Ambulatory Visit: Payer: Self-pay

## 2023-03-18 ENCOUNTER — Emergency Department (HOSPITAL_COMMUNITY)
Admission: EM | Admit: 2023-03-18 | Discharge: 2023-03-19 | Disposition: A | Discharge status: Home / Self Care | Payer: MEDICAID | Attending: Emergency Medicine | Admitting: Emergency Medicine

## 2023-03-18 DIAGNOSIS — J029 Acute pharyngitis, unspecified: Secondary | ICD-10-CM | POA: Diagnosis present

## 2023-03-18 DIAGNOSIS — K5900 Constipation, unspecified: Secondary | ICD-10-CM | POA: Insufficient documentation

## 2023-03-18 DIAGNOSIS — Z1152 Encounter for screening for COVID-19: Secondary | ICD-10-CM | POA: Insufficient documentation

## 2023-03-18 DIAGNOSIS — R109 Unspecified abdominal pain: Secondary | ICD-10-CM

## 2023-03-18 LAB — CBC WITH DIFFERENTIAL/PLATELET
Abs Immature Granulocytes: 0.01 10*3/uL (ref 0.00–0.07)
Basophils Absolute: 0 10*3/uL (ref 0.0–0.1)
Basophils Relative: 1 %
Eosinophils Absolute: 0.3 10*3/uL (ref 0.0–0.5)
Eosinophils Relative: 4 %
HCT: 32.8 % — ABNORMAL LOW (ref 36.0–46.0)
Hemoglobin: 10.5 g/dL — ABNORMAL LOW (ref 12.0–15.0)
Immature Granulocytes: 0 %
Lymphocytes Relative: 22 %
Lymphs Abs: 1.4 10*3/uL (ref 0.7–4.0)
MCH: 30.4 pg (ref 26.0–34.0)
MCHC: 32 g/dL (ref 30.0–36.0)
MCV: 95.1 fL (ref 80.0–100.0)
Monocytes Absolute: 0.5 10*3/uL (ref 0.1–1.0)
Monocytes Relative: 8 %
Neutro Abs: 3.9 10*3/uL (ref 1.7–7.7)
Neutrophils Relative %: 65 %
Platelets: 174 10*3/uL (ref 150–400)
RBC: 3.45 MIL/uL — ABNORMAL LOW (ref 3.87–5.11)
RDW: 12.9 % (ref 11.5–15.5)
WBC: 6.1 10*3/uL (ref 4.0–10.5)
nRBC: 0 % (ref 0.0–0.2)

## 2023-03-18 LAB — BASIC METABOLIC PANEL
Anion gap: 5 (ref 5–15)
BUN: 11 mg/dL (ref 6–20)
CO2: 29 mmol/L (ref 22–32)
Calcium: 8.9 mg/dL (ref 8.9–10.3)
Chloride: 103 mmol/L (ref 98–111)
Creatinine, Ser: 0.7 mg/dL (ref 0.44–1.00)
GFR, Estimated: >60 mL/min (ref >60)
Glucose, Bld: 106 mg/dL — ABNORMAL HIGH (ref 70–99)
Potassium: 4 mmol/L (ref 3.5–5.1)
Sodium: 137 mmol/L (ref 135–145)

## 2023-03-18 LAB — GROUP A STREP BY PCR: Group A Strep by PCR: NOT DETECTED

## 2023-03-18 NOTE — ED Triage Notes (Signed)
Patient complains of bilateral leg swelling and feet hurting x 2 days.  Reports hx of kidney problems.  Also complains of sore throat.  No tonsillar swelling noted.

## 2023-03-18 NOTE — ED Provider Notes (Signed)
[facility] Emergency Department Provider Note MRN:  [id number]  Arrival date & time: 03/19/23     Chief Complaint   Sore Throat   History of Present Illness   Patty Mata is a 32 y.o. year-old female presents to the ED with chief complaint of urinary hesitancy, flank pain, and sore throat.  She has been having symptoms for the past couple of days.  She has hx of kidney stones.  She denies successful treatments PTA.  She also states that it seems like her legs are swollen.  History provided by patient.   Review of Systems  Pertinent positive and negative review of systems noted in HPI.    Physical Exam   Vitals:   03/18/23 1756 03/18/23 2127  BP: 123/74 116/83  Pulse: 78 92  Resp: 18 16  Temp: 98.3 F (36.8 C)   SpO2: 100% 98%    CONSTITUTIONAL:  non toxic-appearing, NAD NEURO:  Alert and oriented x 3, CN 3-12 grossly intact EYES:  eyes equal and reactive ENT/NECK:  Supple, no stridor, oropharynx is clear, no exudates or abscess CARDIO:  normal rate, regular rhythm, appears well-perfused  PULM:  No respiratory distress, CTAB GI/GU:  non-distended,  MSK/SPINE:  No gross deformities, no edema or swelling noted, moves all extremities  SKIN:  no rash, atraumatic   *Additional and/or pertinent findings included in MDM below  Diagnostic and Interventional Summary    EKG Interpretation Date/Time:    Ventricular Rate:    PR Interval:    QRS Duration:    QT Interval:    QTC Calculation:   R Axis:      Text Interpretation:         Labs Reviewed  CBC WITH DIFFERENTIAL/PLATELET - Abnormal; Notable for the following components:      Result Value   RBC 3.45 (*)    Hemoglobin 10.5 (*)    HCT 32.8 (*)    All other components within normal limits  BASIC METABOLIC PANEL - Abnormal; Notable for the following components:   Glucose, Bld 106 (*)    All other components within normal limits  URINALYSIS, ROUTINE W REFLEX MICROSCOPIC - Abnormal;  Notable for the following components:   APPearance HAZY (*)    Leukocytes,Ua TRACE (*)    All other components within normal limits  GROUP A STREP BY PCR  RESP PANEL BY RT-PCR (RSV, FLU A&B, COVID)  RVPGX2  PREGNANCY, URINE    CT Renal Stone Study  Final Result      Medications - No data to display   Procedures  /  Critical Care Procedures  ED Course and Medical Decision Making  I have reviewed the triage vital signs, the nursing notes, and pertinent available records from the EMR.  Social Determinants Affecting Complexity of Care: Patient has no clinically significant social determinants affecting this chief complaint..   ED Course:    Medical Decision Making Patient here with urinary hesitancy and flank pain.  Hx of kidney stones.  She doesn't look uncomfortable or typical of the presentation of a kidney stone.  Will check UA.  If the urine is worrisome, will consider adding CT.  Strep test is negative.  Will add COVID/flu.    COVID and flu negative.  UA negative.  Still complaining of flank pain.  CT ordered to rule out KS.  CT notable for moderate stool collection.  Will treat with miralax and colace.  Remaining workup looks good.  Patient reassured.  She  appears stable for discharge and outpatient follow-up.  Amount and/or Complexity of Data Reviewed Labs: ordered. Radiology: ordered.  Risk OTC drugs.         Consultants: No consultations were needed in caring for this patient.   Treatment and Plan: I considered admission due to patient's initial presentation, but after considering the examination and diagnostic results, patient will not require admission and can be discharged with outpatient follow-up.    Final Clinical Impressions(s) / ED Diagnoses     ICD-10-CM   1. Pharyngitis, unspecified etiology  J02.9     2. Constipation, unspecified constipation type  K59.00     3. Flank pain  R10.9       ED Discharge Orders          Ordered     polyethylene glycol powder (GLYCOLAX/MIRALAX) 17 GM/SCOOP powder  2 times daily        03/19/23 0225    docusate sodium (COLACE) 100 MG capsule  Every 12 hours        03/19/23 0225              Discharge Instructions Discussed with and Provided to Patient:   Discharge Instructions   None      Roxy Horseman, PA-C 03/19/23 0226    Zadie Rhine, MD 03/19/23 [phone]

## 2023-03-18 NOTE — ED Notes (Signed)
Pt came back after smoke break.

## 2023-03-18 NOTE — ED Notes (Signed)
Pt seen walking out the ED with family.

## 2023-03-19 ENCOUNTER — Emergency Department (HOSPITAL_COMMUNITY): Payer: MEDICAID

## 2023-03-19 LAB — URINALYSIS, ROUTINE W REFLEX MICROSCOPIC
Bacteria, UA: NONE SEEN
Bilirubin Urine: NEGATIVE
Glucose, UA: NEGATIVE mg/dL
Hgb urine dipstick: NEGATIVE
Ketones, ur: NEGATIVE mg/dL
Nitrite: NEGATIVE
Protein, ur: NEGATIVE mg/dL
Specific Gravity, Urine: 1.015 (ref 1.005–1.030)
pH: 7 (ref 5.0–8.0)

## 2023-03-19 LAB — RESP PANEL BY RT-PCR (RSV, FLU A&B, COVID)  RVPGX2
Influenza A by PCR: NEGATIVE
Influenza B by PCR: NEGATIVE
Resp Syncytial Virus by PCR: NEGATIVE
SARS Coronavirus 2 by RT PCR: NEGATIVE

## 2023-03-19 LAB — PREGNANCY, URINE: Preg Test, Ur: NEGATIVE

## 2023-03-19 MED ORDER — DOCUSATE SODIUM 100 MG PO CAPS: 100.0000 mg | ORAL_CAPSULE | Freq: Two times a day (BID) | ORAL | 0 refills | Status: AC

## 2023-03-19 MED ORDER — POLYETHYLENE GLYCOL 3350 17 GM/SCOOP PO POWD: 17.0000 g | Freq: Two times a day (BID) | ORAL | 0 refills | Status: AC

## 2023-03-21 ENCOUNTER — Emergency Department (HOSPITAL_COMMUNITY)
Admission: EM | Admit: 2023-03-21 | Discharge: 2023-03-21 | Discharge status: Against Medical Advice | Payer: MEDICAID | Attending: Emergency Medicine | Admitting: Emergency Medicine

## 2023-03-21 ENCOUNTER — Other Ambulatory Visit: Payer: Self-pay

## 2023-03-21 ENCOUNTER — Encounter (HOSPITAL_COMMUNITY): Payer: Self-pay | Admitting: *Deleted

## 2023-03-21 DIAGNOSIS — N898 Other specified noninflammatory disorders of vagina: Secondary | ICD-10-CM | POA: Insufficient documentation

## 2023-03-21 DIAGNOSIS — Z5321 Procedure and treatment not carried out due to patient leaving prior to being seen by health care provider: Secondary | ICD-10-CM | POA: Diagnosis not present

## 2023-03-21 DIAGNOSIS — R1030 Lower abdominal pain, unspecified: Secondary | ICD-10-CM | POA: Diagnosis present

## 2023-03-21 LAB — CBC
HCT: 36.5 % (ref 36.0–46.0)
Hemoglobin: 11.6 g/dL — ABNORMAL LOW (ref 12.0–15.0)
MCH: 30.3 pg (ref 26.0–34.0)
MCHC: 31.8 g/dL (ref 30.0–36.0)
MCV: 95.3 fL (ref 80.0–100.0)
Platelets: 197 10*3/uL (ref 150–400)
RBC: 3.83 MIL/uL — ABNORMAL LOW (ref 3.87–5.11)
RDW: 13.2 % (ref 11.5–15.5)
WBC: 4.4 10*3/uL (ref 4.0–10.5)
nRBC: 0 % (ref 0.0–0.2)

## 2023-03-21 LAB — URINALYSIS, ROUTINE W REFLEX MICROSCOPIC
Bilirubin Urine: NEGATIVE
Glucose, UA: NEGATIVE mg/dL
Hgb urine dipstick: NEGATIVE
Ketones, ur: NEGATIVE mg/dL
Leukocytes,Ua: NEGATIVE
Nitrite: NEGATIVE
Protein, ur: NEGATIVE mg/dL
Specific Gravity, Urine: 1.023 (ref 1.005–1.030)
pH: 5 (ref 5.0–8.0)

## 2023-03-21 LAB — COMPREHENSIVE METABOLIC PANEL
ALT: 24 U/L (ref 0–44)
AST: 26 U/L (ref 15–41)
Albumin: 3.7 g/dL (ref 3.5–5.0)
Alkaline Phosphatase: 70 U/L (ref 38–126)
Anion gap: 8 (ref 5–15)
BUN: 12 mg/dL (ref 6–20)
CO2: 27 mmol/L (ref 22–32)
Calcium: 9.2 mg/dL (ref 8.9–10.3)
Chloride: 105 mmol/L (ref 98–111)
Creatinine, Ser: 0.97 mg/dL (ref 0.44–1.00)
GFR, Estimated: >60 mL/min (ref >60)
Glucose, Bld: 115 mg/dL — ABNORMAL HIGH (ref 70–99)
Potassium: 3.8 mmol/L (ref 3.5–5.1)
Sodium: 140 mmol/L (ref 135–145)
Total Bilirubin: 0.4 mg/dL (ref <1.2)
Total Protein: 6.7 g/dL (ref 6.5–8.1)

## 2023-03-21 LAB — LIPASE, BLOOD: Lipase: 22 U/L (ref 11–51)

## 2023-03-21 LAB — HCG, SERUM, QUALITATIVE: Preg, Serum: NEGATIVE

## 2023-03-21 NOTE — ED Triage Notes (Signed)
Patient c/o lower abd. Pain and vaginal discharge, states she was here several days ago and was told she was constipated states she is having problems urinating, states she feels like she has to push out her urine.Patty Mata

## 2023-03-21 NOTE — ED Notes (Signed)
Pt is no where to be found. OTF

## 2023-03-21 NOTE — ED Notes (Signed)
Called x 1 for triage 

## 2023-03-23 ENCOUNTER — Emergency Department (HOSPITAL_COMMUNITY)
Admission: EM | Admit: 2023-03-23 | Discharge: 2023-03-23 | Disposition: A | Discharge status: Home / Self Care | Payer: MEDICAID | Attending: Emergency Medicine | Admitting: Emergency Medicine

## 2023-03-23 DIAGNOSIS — R3911 Hesitancy of micturition: Secondary | ICD-10-CM | POA: Diagnosis not present

## 2023-03-23 DIAGNOSIS — N760 Acute vaginitis: Secondary | ICD-10-CM | POA: Diagnosis not present

## 2023-03-23 DIAGNOSIS — B9689 Other specified bacterial agents as the cause of diseases classified elsewhere: Secondary | ICD-10-CM | POA: Insufficient documentation

## 2023-03-23 DIAGNOSIS — N898 Other specified noninflammatory disorders of vagina: Secondary | ICD-10-CM | POA: Diagnosis present

## 2023-03-23 LAB — URINALYSIS, ROUTINE W REFLEX MICROSCOPIC
Bilirubin Urine: NEGATIVE
Glucose, UA: NEGATIVE mg/dL
Hgb urine dipstick: NEGATIVE
Ketones, ur: NEGATIVE mg/dL
Leukocytes,Ua: NEGATIVE
Nitrite: NEGATIVE
Protein, ur: NEGATIVE mg/dL
Specific Gravity, Urine: 1.016 (ref 1.005–1.030)
pH: 6 (ref 5.0–8.0)

## 2023-03-23 LAB — WET PREP, GENITAL
Sperm: NONE SEEN
Trich, Wet Prep: NONE SEEN
WBC, Wet Prep HPF POC: <10 (ref <10)
Yeast Wet Prep HPF POC: NONE SEEN

## 2023-03-23 MED ORDER — METRONIDAZOLE 500 MG PO TABS: 500.0000 mg | ORAL_TABLET | Freq: Two times a day (BID) | ORAL | 0 refills | Status: AC

## 2023-03-23 NOTE — ED Provider Notes (Signed)
[name] Provider Note   CSN: [id number] Arrival date & time: 03/23/23  1191     History Chief Complaint  Patient presents with   Dysuria   Vaginal Discharge    Patty Mata is a 32 y.o. female with history of chronic urinary hesitancy presents the emerged from today for evaluation of urinary hesitancy with going on for 6+ months.  She reports that she feels like she has to force her urine out. Denies any dysuria, hematuria, urinary urgency, urinary frequency, vaginal pain or vaginal itching.  She reports that she has had an increase in vaginal discharge that is thin and watery but no abnormal smell or coloration.  Reports that occasionally she will have some right flank pain but this is the same pain that she has been experiencing since she was previously evaluated the ER.  No worsening or change in condition.  She denies any belly pain or any diarrhea or constipation.  She denies any abnormal vaginal bleeding.  She does have medical history of kidney stones and bilateral tubal ligation 2019.  No daily medications.  Allergic to penicillin.  Smoking.  She does not have any overt concerns for STDs.   Dysuria Associated symptoms: vaginal discharge   Associated symptoms: no abdominal pain, no fever, no nausea and no vomiting   Vaginal Discharge Associated symptoms: no abdominal pain, no dysuria, no fever, no nausea and no vomiting        Home Medications Prior to Admission medications   Medication Sig Start Date End Date Taking? Authorizing Provider  docusate sodium (COLACE) 100 MG capsule Take 1 capsule (100 mg total) by mouth every 12 (twelve) hours. 03/19/23   Roxy Horseman, PA-C  fluticasone (FLONASE) 50 MCG/ACT nasal spray Place 2 sprays into both nostrils daily. 05/06/22   Ward, Tylene Fantasia, PA-C  ondansetron (ZOFRAN-ODT) 4 MG disintegrating tablet Take 1 tablet (4 mg total) by mouth every 8 (eight) hours as needed for nausea or  vomiting. 12/24/22   Waldon Merl, PA-C  polyethylene glycol powder (GLYCOLAX/MIRALAX) 17 GM/SCOOP powder Take 17 g by mouth 2 (two) times daily. Until daily soft stools OTC 03/19/23   Roxy Horseman, PA-C  amLODipine (NORVASC) 5 MG tablet Take 1 tablet (5 mg total) by mouth daily. Patient not taking: Reported on 12/05/2018 03/07/18 07/14/19  Tilda Burrow, MD  sertraline (ZOLOFT) 25 MG tablet Take 1 tablet (25 mg total) by mouth daily for 7 days. 11/01/19 12/14/19  Antonieta Pert, MD      Allergies    Penicillins    Review of Systems   Review of Systems  Constitutional:  Negative for chills and fever.  Gastrointestinal:  Negative for abdominal pain, constipation, diarrhea, nausea and vomiting.  Genitourinary:  Positive for vaginal discharge. Negative for decreased urine volume, dysuria, frequency, hematuria, pelvic pain, urgency, vaginal bleeding and vaginal pain.    Physical Exam Updated Vital Signs BP 116/84 (BP Location: Right Arm)   Pulse 69   Temp 97.6 F (36.4 C) (Oral)   Resp 16   SpO2 98%  Physical Exam Vitals and nursing note reviewed. Exam conducted with a chaperone present Hydrographic surveyor, Charity fundraiser).  Constitutional:      General: She is not in acute distress.    Appearance: She is not ill-appearing or toxic-appearing.  Eyes:     General: No scleral icterus. Pulmonary:     Effort: Pulmonary effort is normal. No respiratory distress.  Abdominal:  Palpations: Abdomen is soft.     Tenderness: There is no abdominal tenderness. There is no guarding or rebound.  Genitourinary:    Exam position: Lithotomy position.     Labia:        Right: No rash, tenderness or lesion.        Left: No rash, tenderness or lesion.      Cervix: No cervical motion tenderness.     Comments: Small amount of thin white discharge present in the vaginal canal.  No cervical motion tenderness.  No lesions.  No erythema or purulent discharge noted. Skin:    General: Skin is warm and dry.   Neurological:     Mental Status: She is alert.     ED Results / Procedures / Treatments   Labs (all labs ordered are listed, but only abnormal results are displayed) Labs Reviewed  WET PREP, GENITAL  URINE CULTURE  URINALYSIS, ROUTINE W REFLEX MICROSCOPIC  GC/CHLAMYDIA PROBE AMP ([place]) NOT AT [facility]    EKG None  Radiology No results found.  Procedures Procedures   Medications Ordered in ED Medications - No data to display  ED Course/ Medical Decision Making/ A&P                               Medical Decision Making Amount and/or Complexity of Data Reviewed Labs: ordered.   32 y.o. female presents to the ER for evaluation of urinary hesitancy. Differential diagnosis includes but is not limited to bladder dysfunction, UTI, interstitial cystitis. Vital signs unremarkable. Physical exam as noted above.   I independently reviewed and interpreted the patient's labs.  Urinalysis unremarkable.  Have ordered urine culture.  Wet prep shows clue cells.  Kidney by the patient's having increase in vaginal discharge.  GC pending.  On previous chart evaluation, patient was seen just 5 days prior for flank pain with similar urinary hesitancy.  She reports has been a chronic issues going on for 6+ months.  She reports that whenever she sits down she feels like she has to force her urine out but feels it comes out any constant stream.  She denies urgency or frequency.  Her postvoid residual was 0.  She is not having any acute urinary retention.  Her pelvic exam was unremarkable other than a small amount of vaginal discharge present.  There is no cervical motion tenderness.  Doubt any PID.  She is currently not complaining of any flank pain or abdominal pain.  I have a lower station for any kidney stone or ovarian torsion.  These are all chronic symptoms other than the vaginal discharge with past 2 weeks.  She has not had any follow-up with a urologist or gynecologist for these issues.  I  will place referrals for them in the discharge paperwork.  Will treat the bacterial vaginosis with Flagyl.  Patient stable for discharge home with outpatient follow-up.  We discussed the results of the labs/imaging. The plan is follow-up with specialist, take antibiotic as prescribed. We discussed strict return precautions and red flag symptoms. The patient verbalized their understanding and agrees to the plan. The patient is stable and being discharged home in good condition.  Portions of this report may have been transcribed using voice recognition software. Every effort was made to ensure accuracy; however, inadvertent computerized transcription errors may be present.   Final Clinical Impression(s) / ED Diagnoses Final diagnoses:  Bacterial vaginosis  Urinary hesitancy  Rx / DC Orders ED Discharge Orders     None         Achille Rich, [place] 03/23/23 1418    Gloris Manchester, MD 03/23/23 1535

## 2023-03-23 NOTE — Discharge Instructions (Addendum)
You were seen emergency room today for evaluation of your vaginal and urinary complaints.  Your urine this is normal.  I have added a culture to rule out any hidden infection.  Please check your MyChart for results of this in the next few days.  Ultimately, you are giving this is been going on for a while he would need to follow-up with the urologist.  I included information for urologist into the discharge paperwork.  I have also given you the referral for a gynecologist as well.  You will need to call to schedule an appointment.  Your wet prep showed that you had bacterial vaginosis.  This could be why you are having increased vaginal discharge.  I prescribing a medication called Flagyl which will take twice a day for the next 7 days.  Do not consume any alcohol while on this medication, or 3 days after, or you become extremely ill.  If you have not urinated, have any abdominal bloating, nausea, vomiting, fever, return to the ER.  If you have any concerns, new or worsening symptoms, please return to your nearest emerged apartment for reevaluation.  Contact a doctor if: Your symptoms do not get better, even after you are treated. You have more discharge or pain when you pee. You have a fever or chills. You have pain in your belly (abdomen) or in the area between your hips. You have pain with sex. You bleed from your vagina between menstrual periods.

## 2023-03-23 NOTE — ED Triage Notes (Signed)
Pt reports that she has been having trouble urinating, feeling like she has to push hard to urinate. Pt seen recently for same. Pt also states that she has been having increased vaginal discharge, clear in color and without odor. LMP approximately two weeks ago. Pt denies abd pain, N/V/D.

## 2023-03-24 LAB — URINE CULTURE

## 2023-03-25 LAB — GC/CHLAMYDIA PROBE AMP (~~LOC~~) NOT AT ARMC
Chlamydia: NEGATIVE
Comment: NEGATIVE
Comment: NORMAL
Neisseria Gonorrhea: NEGATIVE

## 2023-03-27 ENCOUNTER — Emergency Department (HOSPITAL_COMMUNITY)
Admission: EM | Admit: 2023-03-27 | Discharge: 2023-03-28 | Discharge status: Against Medical Advice | Payer: MEDICAID | Attending: Emergency Medicine | Admitting: Emergency Medicine

## 2023-03-27 ENCOUNTER — Encounter (HOSPITAL_COMMUNITY): Payer: Self-pay

## 2023-03-27 ENCOUNTER — Emergency Department (HOSPITAL_COMMUNITY): Payer: MEDICAID

## 2023-03-27 ENCOUNTER — Other Ambulatory Visit: Payer: Self-pay

## 2023-03-27 DIAGNOSIS — R2243 Localized swelling, mass and lump, lower limb, bilateral: Secondary | ICD-10-CM | POA: Diagnosis present

## 2023-03-27 DIAGNOSIS — Z5321 Procedure and treatment not carried out due to patient leaving prior to being seen by health care provider: Secondary | ICD-10-CM | POA: Insufficient documentation

## 2023-03-27 NOTE — ED Triage Notes (Signed)
Pt complaining of both legs and feet feel tight and swollen. York Spaniel it started a week ago. Never had any issues with this in the past.

## 2023-03-28 LAB — HCG, SERUM, QUALITATIVE: Preg, Serum: NEGATIVE

## 2023-03-28 LAB — CBC
HCT: 33.5 % — ABNORMAL LOW (ref 36.0–46.0)
Hemoglobin: 10.6 g/dL — ABNORMAL LOW (ref 12.0–15.0)
MCH: 30.4 pg (ref 26.0–34.0)
MCHC: 31.6 g/dL (ref 30.0–36.0)
MCV: 96 fL (ref 80.0–100.0)
Platelets: 183 10*3/uL (ref 150–400)
RBC: 3.49 MIL/uL — ABNORMAL LOW (ref 3.87–5.11)
RDW: 13.2 % (ref 11.5–15.5)
WBC: 6.3 10*3/uL (ref 4.0–10.5)
nRBC: 0 % (ref 0.0–0.2)

## 2023-03-28 LAB — BASIC METABOLIC PANEL
Anion gap: 6 (ref 5–15)
BUN: 8 mg/dL (ref 6–20)
CO2: 28 mmol/L (ref 22–32)
Calcium: 9 mg/dL (ref 8.9–10.3)
Chloride: 105 mmol/L (ref 98–111)
Creatinine, Ser: 0.63 mg/dL (ref 0.44–1.00)
GFR, Estimated: >60 mL/min (ref >60)
Glucose, Bld: 97 mg/dL (ref 70–99)
Potassium: 4.3 mmol/L (ref 3.5–5.1)
Sodium: 139 mmol/L (ref 135–145)

## 2023-03-28 NOTE — ED Notes (Signed)
Pt left lobby at [phone]

## 2023-04-06 ENCOUNTER — Emergency Department (HOSPITAL_COMMUNITY): Payer: MEDICAID

## 2023-04-06 ENCOUNTER — Encounter (HOSPITAL_COMMUNITY): Payer: Self-pay

## 2023-04-06 ENCOUNTER — Emergency Department (HOSPITAL_COMMUNITY)
Admission: EM | Admit: 2023-04-06 | Discharge: 2023-04-06 | Disposition: A | Discharge status: Home / Self Care | Payer: MEDICAID | Attending: Emergency Medicine | Admitting: Emergency Medicine

## 2023-04-06 ENCOUNTER — Other Ambulatory Visit: Payer: Self-pay

## 2023-04-06 DIAGNOSIS — R079 Chest pain, unspecified: Secondary | ICD-10-CM | POA: Diagnosis present

## 2023-04-06 DIAGNOSIS — R0789 Other chest pain: Secondary | ICD-10-CM

## 2023-04-06 DIAGNOSIS — D649 Anemia, unspecified: Secondary | ICD-10-CM | POA: Diagnosis not present

## 2023-04-06 DIAGNOSIS — R1011 Right upper quadrant pain: Secondary | ICD-10-CM | POA: Insufficient documentation

## 2023-04-06 LAB — HEPATIC FUNCTION PANEL
ALT: 16 U/L (ref 0–44)
AST: 19 U/L (ref 15–41)
Albumin: 3.5 g/dL (ref 3.5–5.0)
Alkaline Phosphatase: 61 U/L (ref 38–126)
Bilirubin, Direct: <0.1 mg/dL (ref 0.0–0.2)
Total Bilirubin: 0.4 mg/dL (ref 0.0–1.2)
Total Protein: 6.5 g/dL (ref 6.5–8.1)

## 2023-04-06 LAB — HCG, SERUM, QUALITATIVE: Preg, Serum: NEGATIVE

## 2023-04-06 LAB — BASIC METABOLIC PANEL
Anion gap: 6 (ref 5–15)
BUN: 15 mg/dL (ref 6–20)
CO2: 26 mmol/L (ref 22–32)
Calcium: 9.4 mg/dL (ref 8.9–10.3)
Chloride: 106 mmol/L (ref 98–111)
Creatinine, Ser: 0.73 mg/dL (ref 0.44–1.00)
GFR, Estimated: >60 mL/min (ref >60)
Glucose, Bld: 102 mg/dL — ABNORMAL HIGH (ref 70–99)
Potassium: 4.1 mmol/L (ref 3.5–5.1)
Sodium: 138 mmol/L (ref 135–145)

## 2023-04-06 LAB — CBC
HCT: 33.6 % — ABNORMAL LOW (ref 36.0–46.0)
Hemoglobin: 10.8 g/dL — ABNORMAL LOW (ref 12.0–15.0)
MCH: 30.6 pg (ref 26.0–34.0)
MCHC: 32.1 g/dL (ref 30.0–36.0)
MCV: 95.2 fL (ref 80.0–100.0)
Platelets: 169 10*3/uL (ref 150–400)
RBC: 3.53 MIL/uL — ABNORMAL LOW (ref 3.87–5.11)
RDW: 13 % (ref 11.5–15.5)
WBC: 6.5 10*3/uL (ref 4.0–10.5)
nRBC: 0 % (ref 0.0–0.2)

## 2023-04-06 LAB — TROPONIN I (HIGH SENSITIVITY)
Troponin I (High Sensitivity): <2 ng/L (ref <18)
Troponin I (High Sensitivity): <2 ng/L

## 2023-04-06 LAB — LIPASE, BLOOD: Lipase: 24 U/L (ref 11–51)

## 2023-04-06 NOTE — ED Provider Notes (Signed)
 [place] EMERGENC[facility] DEPARTMENT AT [facility] Provider Note   CSN: [id number] Arrival date & time: 04/06/23  0000     History  Chief Complaint  Patient presents with   Chest Pain    Patty Mata is a 33 y.o. female with past medical history significant for alcohol induced mood disorder, substance-induced mood disorder, depression, frequent urinary tract infections who presents with concern for right sided chest pain radiating to her right arm for around 1 and half weeks.  She endorses Mild shortness of breath.  Patient denies any heavy drinking recently.  But she does report that she has had problems with her gallbladder before.  She denies any nausea, vomiting.  She thinks the pain is worse with walking but not with eating.  She denies any recent travel, she denies taking hormonal birth control.  No previous history of cancer.  No previous history of blood clots.   Chest Pain      Home Medications Prior to Admission medications   Medication Sig Start Date End Date Taking? Authorizing Provider  docusate sodium  (COLACE) 100 MG capsule Take 1 capsule (100 mg total) by mouth every 12 (twelve) hours. 03/19/23   Vicky Charleston, PA-C  fluticasone  (FLONASE ) 50 MCG/ACT nasal spray Place 2 sprays into both nostrils daily. 05/06/22   Ward, Jessica Z, PA-C  metroNIDAZOLE  (FLAGYL ) 500 MG tablet Take 1 tablet (500 mg total) by mouth 2 (two) times daily. 03/23/23   Bernis Ernst, PA-C  ondansetron  (ZOFRAN -ODT) 4 MG disintegrating tablet Take 1 tablet (4 mg total) by mouth every 8 (eight) hours as needed for nausea or vomiting. 12/24/22   Gladis Elsie BROCKS, PA-C  polyethylene glycol powder (GLYCOLAX /MIRALAX ) 17 GM/SCOOP powder Take 17 g by mouth 2 (two) times daily. Until daily soft stools OTC 03/19/23   Vicky Charleston, PA-C  amLODipine  (NORVASC ) 5 MG tablet Take 1 tablet (5 mg total) by mouth daily. Patient not taking: Reported on 12/05/2018 03/07/18 07/14/19  Edsel Norleen GAILS, MD   sertraline  (ZOLOFT ) 25 MG tablet Take 1 tablet (25 mg total) by mouth daily for 7 days. 11/01/19 12/14/19  Kendall Cathlyn Collum, MD      Allergies    Penicillins    Review of Systems   Review of Systems  Cardiovascular:  Positive for chest pain.  All other systems reviewed and are negative.   Physical Exam Updated Vital Signs BP 107/64 (BP Location: Right Arm)   Pulse 71   Temp 97.8 F (36.6 C) (Oral)   Resp 14   Ht 5' 2 (1.575 m)   Wt 57.2 kg   LMP 03/06/2023   SpO2 100%   BMI 23.05 kg/m  Physical Exam Vitals and nursing note reviewed.  Constitutional:      General: She is not in acute distress.    Appearance: Normal appearance.  HENT:     Head: Normocephalic and atraumatic.  Eyes:     General:        Right eye: No discharge.        Left eye: No discharge.  Cardiovascular:     Rate and Rhythm: Normal rate and regular rhythm.     Heart sounds: No murmur heard.    No friction rub. No gallop.  Pulmonary:     Effort: Pulmonary effort is normal.     Breath sounds: Normal breath sounds.     Comments: No wheezing, rhonchi, stridor, rales. Abdominal:     General: Bowel sounds are normal.  Palpations: Abdomen is soft.     Comments: Some focal tenderness to palpation in the right upper quadrant but no rebound, rigidity, guarding.  Negative Murphy sign.  Skin:    General: Skin is warm and dry.     Capillary Refill: Capillary refill takes less than 2 seconds.  Neurological:     Mental Status: She is alert and oriented to person, place, and time.  Psychiatric:        Mood and Affect: Mood normal.        Behavior: Behavior normal.     ED Results / Procedures / Treatments   Labs (all labs ordered are listed, but only abnormal results are displayed) Labs Reviewed  BASIC METABOLIC PANEL - Abnormal; Notable for the following components:      Result Value   Glucose, Bld 102 (*)    All other components within normal limits  CBC - Abnormal; Notable for the following  components:   RBC 3.53 (*)    Hemoglobin 10.8 (*)    HCT 33.6 (*)    All other components within normal limits  HCG, SERUM, QUALITATIVE  TROPONIN I (HIGH SENSITIVITY)  TROPONIN I (HIGH SENSITIVITY)    EKG None  Radiology DG Chest 2 View Result Date: 04/06/2023 CLINICAL DATA:  Chest pain EXAM: CHEST - 2 VIEW COMPARISON:  03/27/2023 FINDINGS: The heart size and mediastinal contours are within normal limits. Both lungs are clear. The visualized skeletal structures are unremarkable. IMPRESSION: No active cardiopulmonary disease. Electronically Signed   By: Norman Gatlin M.D.   On: 04/06/2023 01:02    Procedures Procedures    Medications Ordered in ED Medications - No data to display  ED Course/ Medical Decision Making/ A&P                                 Medical Decision Making Amount and/or Complexity of Data Reviewed Labs: ordered. Radiology: ordered.   This patient is a 33 y.o. female  who presents to the ED for concern of chest pain, shortness of breath.   Differential diagnoses prior to evaluation: The emergent differential diagnosis includes, but is not limited to,  ACS, AAS, PE, Mallory-Weiss, Boerhaave's, Pneumonia, acute bronchitis, asthma or COPD exacerbation, anxiety, MSK pain or traumatic injury to the chest, acid reflux versus other, also suspected referred pain from gallbladder given right sided chest pain and patient's history of gall use. This is not an exhaustive differential.   Past Medical History / Co-morbidities / Social History: alcohol induced mood disorder, substance-induced mood disorder, depression, frequent urinary tract infections  Additional history: Chart reviewed. Pertinent results include: Reviewed lab work, imaging from previous emergency department visits  Physical Exam: Physical exam performed. The pertinent findings include: No significant tenderness to palpation of the abdomen other than the mild right upper quadrant tenderness without  Murphy sign.  No significant tenderness to palpation of chest wall.  Lab Tests/Imaging studies: I personally interpreted labs/imaging and the pertinent results include: Independently interpreted BMP which is unremarkable, CBC shows mild anemia, hemoglobin 10.8, otherwise unremarkable.  Negative troponin x 2, troponin undetectable, hepatic function panel unremarkable, lipase normal, serum pregnancy test negative.  I independently interpreted plain film chest x-ray which shows no evidence of acute intrathoracic abnormality. I agree with the radiologist interpretation.  Cardiac monitoring: EKG obtained and interpreted by myself and attending physician which shows: Normal sinus rhythm   Medications: Encouraged ibuprofen , Tylenol  as needed.  Disposition: After consideration of the diagnostic results and the patients response to treatment, I feel that patient is negative by [facility] criteria for possible PE, her workup is otherwise reassuring, suspect either musculoskeletal or anxiety related chest pain, no focal abnormality noted in the ED.SABRA  emergency department workup does not suggest an emergent condition requiring admission or immediate intervention beyond what has been performed at this time. The plan is: as above. The patient is safe for discharge and has been instructed to return immediately for worsening symptoms, change in symptoms or any other concerns.  Final Clinical Impression(s) / ED Diagnoses Final diagnoses:  None    Rx / DC Orders ED Discharge Orders     None         Rosan Sherlean DEL, PA-C 04/06/23 0847    Dean Clarity, MD 04/06/23 [phone]

## 2023-04-06 NOTE — ED Triage Notes (Signed)
 R sided CP radiating to R arm x1.5 weeks. Mild SHOB.

## 2023-04-06 NOTE — Discharge Instructions (Signed)
 Please use Tylenol  or ibuprofen  for pain.  You may use 600 mg ibuprofen  every 6 hours or 1000 mg of Tylenol  every 6 hours.  You may choose to alternate between the 2.  This would be most effective.  Not to exceed 4 g of Tylenol  within 24 hours.  Not to exceed 3200 mg ibuprofen  24 hours.  Your workup today was reassuring, did not see any evidence of heart, lung disease, gallbladder or liver disease.  Your pain could be musculoskeletal in nature versus related to anxiety.  If you have significantly worsening chest pain, shortness of breath please return for further evaluation and management.

## 2023-04-15 ENCOUNTER — Emergency Department (HOSPITAL_COMMUNITY)
Admission: EM | Admit: 2023-04-15 | Discharge: 2023-04-16 | Disposition: A | Discharge status: Home / Self Care | Payer: MEDICAID | Attending: Emergency Medicine | Admitting: Emergency Medicine

## 2023-04-15 ENCOUNTER — Other Ambulatory Visit: Payer: Self-pay

## 2023-04-15 ENCOUNTER — Encounter (HOSPITAL_COMMUNITY): Payer: Self-pay | Admitting: Emergency Medicine

## 2023-04-15 DIAGNOSIS — H6690 Otitis media, unspecified, unspecified ear: Secondary | ICD-10-CM

## 2023-04-15 DIAGNOSIS — H9201 Otalgia, right ear: Secondary | ICD-10-CM | POA: Diagnosis present

## 2023-04-15 DIAGNOSIS — H6691 Otitis media, unspecified, right ear: Secondary | ICD-10-CM | POA: Diagnosis not present

## 2023-04-15 NOTE — ED Triage Notes (Signed)
 Patient complaining of right ear pain x2 day. Pain described as throbbing. Denies fevers.

## 2023-04-16 MED ORDER — CEFDINIR 300 MG PO CAPS: 300.0000 mg | ORAL_CAPSULE | Freq: Two times a day (BID) | ORAL | 0 refills | Status: AC

## 2023-04-16 MED ORDER — ACETAMINOPHEN 500 MG PO TABS
1000.0000 mg | ORAL_TABLET | Freq: Once | ORAL | Status: AC
  Administered 2023-04-16: 1000 mg via ORAL
  Filled 2023-04-16: qty 2

## 2023-04-16 NOTE — Discharge Instructions (Signed)
 Evaluation revealed you likely have a middle ear infection (acute otitis media). I am sending Omnicef  (an antibiotic) to your pharmacy for treatment. You can take tylenol  and ibuprofen  at home to treat pain and symptoms. Please follow up with PCP.

## 2023-04-16 NOTE — ED Provider Notes (Signed)
 [place] EMERGENCY DEPARTMENT AT [facility] Provider Note   CSN: [id number] Arrival date & time: 04/15/23  2245     History  Chief Complaint  Patient presents with   Otalgia   HPI Patty Mata is a 33 y.o. female presenting for right ear pain. Started two days ago. Denies hearing loss and tinnitus. Also reports nasal congestion. Denies fever.    Otalgia      Home Medications Prior to Admission medications   Medication Sig Start Date End Date Taking? Authorizing Provider  cefdinir  (OMNICEF ) 300 MG capsule Take 1 capsule (300 mg total) by mouth 2 (two) times daily for 10 days. 04/16/23 04/26/23 Yes Janalee Mcmurray, PA-C  docusate sodium  (COLACE) 100 MG capsule Take 1 capsule (100 mg total) by mouth every 12 (twelve) hours. 03/19/23   Sherel Dikes, PA-C  fluticasone  (FLONASE ) 50 MCG/ACT nasal spray Place 2 sprays into both nostrils daily. 05/06/22   Ward, Char Common, PA-C  metroNIDAZOLE  (FLAGYL ) 500 MG tablet Take 1 tablet (500 mg total) by mouth 2 (two) times daily. 03/23/23   Spence Dux, PA-C  ondansetron  (ZOFRAN -ODT) 4 MG disintegrating tablet Take 1 tablet (4 mg total) by mouth every 8 (eight) hours as needed for nausea or vomiting. 12/24/22   Farris Hong, PA-C  polyethylene glycol powder (GLYCOLAX /MIRALAX ) 17 GM/SCOOP powder Take 17 g by mouth 2 (two) times daily. Until daily soft stools OTC 03/19/23   Sherel Dikes, PA-C  amLODipine  (NORVASC ) 5 MG tablet Take 1 tablet (5 mg total) by mouth daily. Patient not taking: Reported on 12/05/2018 03/07/18 07/14/19  Albino Hum, MD  sertraline  (ZOLOFT ) 25 MG tablet Take 1 tablet (25 mg total) by mouth daily for 7 days. 11/01/19 12/14/19  Ananias Balls, MD      Allergies    Penicillins    Review of Systems   Review of Systems  HENT:  Positive for ear pain.     Physical Exam Updated Vital Signs BP 113/65   Pulse 73   Temp 98 F (36.7 C)   Resp 16   Ht 5\' 1"  (1.549 m)   Wt 70.3 kg   LMP  03/06/2023   SpO2 100%   BMI 29.29 kg/m  Physical Exam Constitutional:      Appearance: Normal appearance.  HENT:     Head: Normocephalic.     Right Ear: No mastoid tenderness. Tympanic membrane is injected, erythematous and bulging.     Left Ear: Tympanic membrane normal. No mastoid tenderness.     Nose: Nose normal.  Eyes:     Conjunctiva/sclera: Conjunctivae normal.  Pulmonary:     Effort: Pulmonary effort is normal.  Neurological:     Mental Status: She is alert.  Psychiatric:        Mood and Affect: Mood normal.     ED Results / Procedures / Treatments   Labs (all labs ordered are listed, but only abnormal results are displayed) Labs Reviewed - No data to display  EKG None  Radiology No results found.  Procedures Procedures    Medications Ordered in ED Medications  acetaminophen  (TYLENOL ) tablet 1,000 mg (has no administration in time range)    ED Course/ Medical Decision Making/ A&P                                 Medical Decision Making  33 yo well appearing female presenting for right ear  pain. Exam findings concerning for acute otitis media. Sent cefdinir  to her pharmacy. Treated with Tylenol . Advised to follow up with PCP. Discussed return precautions. Discharged in good condition.         Final Clinical Impression(s) / ED Diagnoses Final diagnoses:  Acute otitis media, unspecified otitis media type    Rx / DC Orders ED Discharge Orders          Ordered    cefdinir  (OMNICEF ) 300 MG capsule  2 times daily        04/16/23 0603              Janalee Mcmurray, PA-C 04/16/23 0606    Rosealee Concha, MD 04/16/23 [phone]

## 2023-04-21 ENCOUNTER — Emergency Department (HOSPITAL_COMMUNITY)
Admission: EM | Admit: 2023-04-21 | Discharge: 2023-04-22 | Disposition: A | Discharge status: Home / Self Care | Payer: MEDICAID | Attending: Emergency Medicine | Admitting: Emergency Medicine

## 2023-04-21 ENCOUNTER — Encounter (HOSPITAL_COMMUNITY): Payer: Self-pay

## 2023-04-21 ENCOUNTER — Emergency Department (HOSPITAL_COMMUNITY): Payer: MEDICAID

## 2023-04-21 DIAGNOSIS — J069 Acute upper respiratory infection, unspecified: Secondary | ICD-10-CM | POA: Insufficient documentation

## 2023-04-21 DIAGNOSIS — R0602 Shortness of breath: Secondary | ICD-10-CM

## 2023-04-21 LAB — CBC
HCT: 34.9 % — ABNORMAL LOW (ref 36.0–46.0)
Hemoglobin: 11.2 g/dL — ABNORMAL LOW (ref 12.0–15.0)
MCH: 30.8 pg (ref 26.0–34.0)
MCHC: 32.1 g/dL (ref 30.0–36.0)
MCV: 95.9 fL (ref 80.0–100.0)
Platelets: 168 10*3/uL (ref 150–400)
RBC: 3.64 MIL/uL — ABNORMAL LOW (ref 3.87–5.11)
RDW: 12.1 % (ref 11.5–15.5)
WBC: 6.3 10*3/uL (ref 4.0–10.5)
nRBC: 0 % (ref 0.0–0.2)

## 2023-04-21 LAB — BASIC METABOLIC PANEL
Anion gap: 7 (ref 5–15)
BUN: 16 mg/dL (ref 6–20)
CO2: 28 mmol/L (ref 22–32)
Calcium: 9.3 mg/dL (ref 8.9–10.3)
Chloride: 102 mmol/L (ref 98–111)
Creatinine, Ser: 0.73 mg/dL (ref 0.44–1.00)
GFR, Estimated: >60 mL/min (ref >60)
Glucose, Bld: 101 mg/dL — ABNORMAL HIGH (ref 70–99)
Potassium: 4.5 mmol/L (ref 3.5–5.1)
Sodium: 137 mmol/L (ref 135–145)

## 2023-04-21 LAB — BRAIN NATRIURETIC PEPTIDE: B Natriuretic Peptide: 47.6 pg/mL (ref 0.0–100.0)

## 2023-04-21 LAB — HCG, SERUM, QUALITATIVE: Preg, Serum: NEGATIVE

## 2023-04-21 NOTE — ED Triage Notes (Signed)
Pt is coming in with some shortness of breath that occurs mostly with exertion, bilateral leg swelling, and difficulty urinating that has been on going for around 1 week. She mentions no chest pain is otherwise stable at this time. She is no on any medications for heart failure and has no Dx of heart issues. She mentions a previous Hx of IV drug use but not within the last few years

## 2023-04-22 LAB — D-DIMER, QUANTITATIVE: D-Dimer, Quant: <0.27 ug{FEU}/mL (ref 0.00–0.50)

## 2023-04-22 LAB — TROPONIN I (HIGH SENSITIVITY): Troponin I (High Sensitivity): <2 ng/L (ref <18)

## 2023-04-22 NOTE — ED Provider Notes (Signed)
[place] EMERGENCY DEPARTMENT AT [facility] Provider Note   CSN: [id number] Arrival date & time: 04/21/23  2223     History  Chief Complaint  Patient presents with   Shortness of Breath    Patty Mata is a 33 y.o. female.  The history is provided by the patient and medical records.  Shortness of Breath Patty Mata is a 33 y.o. female who presents to the Emergency Department complaining of shortness of breath.  She presents to the emergency department for several days of shortness of breath with chest pressure, worse with breathing.  She reports associated cough productive of green mucus.  No fever.  She does have stuffy nose and sinus congestion.  She also reports 1 week of bilateral lower extremity edema.  She has no known medical problems and takes no routine medications.  No oral contraceptives.  She does use tobacco.  No alcohol or drug use.  No prior similar symptoms.     Home Medications Prior to Admission medications   Medication Sig Start Date End Date Taking? Authorizing Provider  cefdinir (OMNICEF) 300 MG capsule Take 1 capsule (300 mg total) by mouth 2 (two) times daily for 10 days. 04/16/23 04/26/23  Gareth Eagle, PA-C  docusate sodium (COLACE) 100 MG capsule Take 1 capsule (100 mg total) by mouth every 12 (twelve) hours. 03/19/23   Roxy Horseman, PA-C  fluticasone (FLONASE) 50 MCG/ACT nasal spray Place 2 sprays into both nostrils daily. 05/06/22   Ward, Tylene Fantasia, PA-C  metroNIDAZOLE (FLAGYL) 500 MG tablet Take 1 tablet (500 mg total) by mouth 2 (two) times daily. 03/23/23   Achille Rich, PA-C  ondansetron (ZOFRAN-ODT) 4 MG disintegrating tablet Take 1 tablet (4 mg total) by mouth every 8 (eight) hours as needed for nausea or vomiting. 12/24/22   Waldon Merl, PA-C  polyethylene glycol powder (GLYCOLAX/MIRALAX) 17 GM/SCOOP powder Take 17 g by mouth 2 (two) times daily. Until daily soft stools OTC 03/19/23   Roxy Horseman, PA-C  amLODipine  (NORVASC) 5 MG tablet Take 1 tablet (5 mg total) by mouth daily. Patient not taking: Reported on 12/05/2018 03/07/18 07/14/19  Tilda Burrow, MD  sertraline (ZOLOFT) 25 MG tablet Take 1 tablet (25 mg total) by mouth daily for 7 days. 11/01/19 12/14/19  Antonieta Pert, MD      Allergies    Penicillins    Review of Systems   Review of Systems  Respiratory:  Positive for shortness of breath.   All other systems reviewed and are negative.   Physical Exam Updated Vital Signs BP 100/66 (BP Location: Right Arm)   Pulse 68   Temp 97.6 F (36.4 C) (Oral)   Resp 20   LMP 03/06/2023   SpO2 100%  Physical Exam Vitals and nursing note reviewed.  Constitutional:      Appearance: She is well-developed.  HENT:     Head: Normocephalic and atraumatic.  Cardiovascular:     Rate and Rhythm: Normal rate and regular rhythm.     Heart sounds: No murmur heard. Pulmonary:     Effort: Pulmonary effort is normal. No respiratory distress.     Breath sounds: Normal breath sounds.  Abdominal:     Palpations: Abdomen is soft.     Tenderness: There is no abdominal tenderness. There is no guarding or rebound.  Musculoskeletal:        General: No tenderness.     Comments: 1+ edema to bilateral lower extremities  Skin:  General: Skin is warm and dry.  Neurological:     Mental Status: She is alert and oriented to person, place, and time.  Psychiatric:        Behavior: Behavior normal.     ED Results / Procedures / Treatments   Labs (all labs ordered are listed, but only abnormal results are displayed) Labs Reviewed  BASIC METABOLIC PANEL - Abnormal; Notable for the following components:      Result Value   Glucose, Bld 101 (*)    All other components within normal limits  CBC - Abnormal; Notable for the following components:   RBC 3.64 (*)    Hemoglobin 11.2 (*)    HCT 34.9 (*)    All other components within normal limits  HCG, SERUM, QUALITATIVE  BRAIN NATRIURETIC PEPTIDE  D-DIMER,  QUANTITATIVE  TROPONIN I (HIGH SENSITIVITY)    EKG EKG Interpretation Date/Time:  Monday April 21 2023 22:28:51 EST Ventricular Rate:  75 PR Interval:  130 QRS Duration:  78 QT Interval:  370 QTC Calculation: 413 R Axis:   86  Text Interpretation: Normal sinus rhythm Normal ECG Confirmed by Tilden Fossa [phone]) on 04/22/2023 1:37:55 AM  Radiology DG Chest 2 View Result Date: 04/21/2023 CLINICAL DATA:  COPD and shortness of breath EXAM: CHEST - 2 VIEW COMPARISON:  04/06/2023 FINDINGS: The heart size and mediastinal contours are within normal limits. Both lungs are clear. The visualized skeletal structures are unremarkable. IMPRESSION: No active cardiopulmonary disease. Electronically Signed   By: Minerva Fester M.D.   On: 04/21/2023 22:45    Procedures Procedures    Medications Ordered in ED Medications - No data to display  ED Course/ Medical Decision Making/ A&P                                 Medical Decision Making Amount and/or Complexity of Data Reviewed Labs: ordered. Radiology: ordered.   Patient here for evaluation of shortness of breath, cough and chest pressure.  She is nontoxic-appearing on evaluation with no respiratory distress.  Chest x-ray is negative for acute abnormality-images personally reviewed and interpreted, agree with radiologist interpretation..  Troponin is negative, EKG is nonischemic.  BNP is within normal limits, presentation is not consistent with CHF.  She is low risk for DVT/PE, D-dimer is negative.  Current clinical picture is not consistent with DVT, PE.  Exam is not consistent with cellulitis.  Discussed with patient likely viral URI.  Discussed home care with OTC analgesics.  Discussed outpatient follow-up as well as return precautions.        Final Clinical Impression(s) / ED Diagnoses Final diagnoses:  Shortness of breath  Viral URI    Rx / DC Orders ED Discharge Orders     None         Tilden Fossa,  MD 04/22/23 [phone]

## 2023-04-27 ENCOUNTER — Emergency Department (HOSPITAL_COMMUNITY)
Admission: EM | Admit: 2023-04-27 | Discharge: 2023-04-27 | Discharge status: Against Medical Advice | Payer: MEDICAID | Attending: Emergency Medicine | Admitting: Emergency Medicine

## 2023-04-27 ENCOUNTER — Encounter (HOSPITAL_COMMUNITY): Payer: Self-pay | Admitting: *Deleted

## 2023-04-27 ENCOUNTER — Other Ambulatory Visit: Payer: Self-pay

## 2023-04-27 DIAGNOSIS — M545 Low back pain, unspecified: Secondary | ICD-10-CM | POA: Insufficient documentation

## 2023-04-27 DIAGNOSIS — Z5321 Procedure and treatment not carried out due to patient leaving prior to being seen by health care provider: Secondary | ICD-10-CM | POA: Insufficient documentation

## 2023-04-27 NOTE — ED Triage Notes (Signed)
The pt is c/o back and leg pain today with nausea lmp  last month

## 2023-04-27 NOTE — ED Notes (Signed)
Called pt name x5 no answer!

## 2023-05-02 ENCOUNTER — Emergency Department (HOSPITAL_COMMUNITY)
Admission: EM | Admit: 2023-05-02 | Discharge: 2023-05-03 | Disposition: A | Discharge status: Home / Self Care | Payer: MEDICAID | Attending: Physician Assistant | Admitting: Physician Assistant

## 2023-05-02 DIAGNOSIS — B9689 Other specified bacterial agents as the cause of diseases classified elsewhere: Secondary | ICD-10-CM | POA: Insufficient documentation

## 2023-05-02 DIAGNOSIS — M7989 Other specified soft tissue disorders: Secondary | ICD-10-CM | POA: Insufficient documentation

## 2023-05-02 DIAGNOSIS — R399 Unspecified symptoms and signs involving the genitourinary system: Secondary | ICD-10-CM | POA: Diagnosis present

## 2023-05-02 DIAGNOSIS — N390 Urinary tract infection, site not specified: Secondary | ICD-10-CM | POA: Diagnosis not present

## 2023-05-02 NOTE — ED Triage Notes (Signed)
No answer in waiting.  

## 2023-05-03 ENCOUNTER — Other Ambulatory Visit: Payer: Self-pay

## 2023-05-03 ENCOUNTER — Encounter (HOSPITAL_COMMUNITY): Payer: Self-pay | Admitting: *Deleted

## 2023-05-03 LAB — URINALYSIS, MICROSCOPIC (REFLEX): RBC / HPF: NONE SEEN RBC/hpf (ref 0–5)

## 2023-05-03 LAB — URINALYSIS, ROUTINE W REFLEX MICROSCOPIC
Bilirubin Urine: NEGATIVE
Glucose, UA: NEGATIVE mg/dL
Hgb urine dipstick: NEGATIVE
Ketones, ur: NEGATIVE mg/dL
Nitrite: NEGATIVE
Protein, ur: NEGATIVE mg/dL
Specific Gravity, Urine: 1.025 (ref 1.005–1.030)
pH: 6 (ref 5.0–8.0)

## 2023-05-03 MED ORDER — CEPHALEXIN 500 MG PO CAPS: 500.0000 mg | ORAL_CAPSULE | Freq: Three times a day (TID) | ORAL | 0 refills | Status: AC

## 2023-05-03 NOTE — ED Triage Notes (Signed)
The pt is c/o both her feet and legs swollen and painful for 2 days  lmp dec5th

## 2023-05-03 NOTE — ED Provider Notes (Cosign Needed)
[place] EMERGENCY DEPARTMENT AT [facility] Provider Note   CSN: [id number] Arrival date & time: 05/02/23  2345     History  Chief Complaint  Patient presents with   Leg Swelling    Patty Mata is a 33 y.o. female.  Pt complains of swelling to her legs and uti symptoms.  Pt reports she has had kidney stones in the past.    The history is provided by the patient. No language interpreter was used.       Home Medications Prior to Admission medications   Medication Sig Start Date End Date Taking? Authorizing Provider  cephALEXin (KEFLEX) 500 MG capsule Take 1 capsule (500 mg total) by mouth 3 (three) times daily for 7 days. 05/03/23 05/10/23 Yes Cheron Schaumann K, PA-C  docusate sodium (COLACE) 100 MG capsule Take 1 capsule (100 mg total) by mouth every 12 (twelve) hours. 03/19/23   Roxy Horseman, PA-C  fluticasone (FLONASE) 50 MCG/ACT nasal spray Place 2 sprays into both nostrils daily. 05/06/22   Ward, Tylene Fantasia, PA-C  metroNIDAZOLE (FLAGYL) 500 MG tablet Take 1 tablet (500 mg total) by mouth 2 (two) times daily. 03/23/23   Achille Rich, PA-C  ondansetron (ZOFRAN-ODT) 4 MG disintegrating tablet Take 1 tablet (4 mg total) by mouth every 8 (eight) hours as needed for nausea or vomiting. 12/24/22   Waldon Merl, PA-C  polyethylene glycol powder (GLYCOLAX/MIRALAX) 17 GM/SCOOP powder Take 17 g by mouth 2 (two) times daily. Until daily soft stools OTC 03/19/23   Roxy Horseman, PA-C  amLODipine (NORVASC) 5 MG tablet Take 1 tablet (5 mg total) by mouth daily. Patient not taking: Reported on 12/05/2018 03/07/18 07/14/19  Tilda Burrow, MD  sertraline (ZOLOFT) 25 MG tablet Take 1 tablet (25 mg total) by mouth daily for 7 days. 11/01/19 12/14/19  Antonieta Pert, MD      Allergies    Penicillins    Review of Systems   Review of Systems  All other systems reviewed and are negative.   Physical Exam Updated Vital Signs BP (!) 94/44   Pulse 70   Temp 98 F (36.7 C)    Resp 17   Ht 5\' 1"  (1.549 m)   Wt 70.3 kg   LMP 04/06/2023   SpO2 100%   BMI 29.28 kg/m  Physical Exam Vitals and nursing note reviewed.  Constitutional:      Appearance: She is well-developed.  HENT:     Head: Normocephalic.  Cardiovascular:     Rate and Rhythm: Normal rate.  Pulmonary:     Effort: Pulmonary effort is normal.  Abdominal:     General: There is no distension.  Musculoskeletal:        General: Normal range of motion.     Cervical back: Normal range of motion.  Skin:    General: Skin is warm.  Neurological:     General: No focal deficit present.     Mental Status: She is alert and oriented to person, place, and time.     ED Results / Procedures / Treatments   Labs (all labs ordered are listed, but only abnormal results are displayed) Labs Reviewed  URINALYSIS, ROUTINE W REFLEX MICROSCOPIC - Abnormal; Notable for the following components:      Result Value   Leukocytes,Ua SMALL (*)    All other components within normal limits  URINALYSIS, MICROSCOPIC (REFLEX) - Abnormal; Notable for the following components:   Bacteria, UA MANY (*)    All  other components within normal limits    EKG None  Radiology No results found.  Procedures Procedures    Medications Ordered in ED Medications - No data to display  ED Course/ Medical Decision Making/ A&P                                 Medical Decision Making Pt complains of utoi symptoms   Amount and/or Complexity of Data Reviewed Labs: ordered. Decision-making details documented in ED Course.    Details: Ua  many bacteria   Risk Prescription drug management. Risk Details: Pt given rx for keflex.  Pt advised to follow up her Physician for recheck           Final Clinical Impression(s) / ED Diagnoses Final diagnoses:  Urinary tract infection without hematuria, site unspecified    Rx / DC Orders ED Discharge Orders          Ordered    cephALEXin (KEFLEX) 500 MG capsule  3 times  daily        05/03/23 0807           An After Visit Summary was printed and given to the patient.    Elson Areas, [place] 05/03/23 [phone]

## 2023-05-03 NOTE — ED Notes (Signed)
Na for triage

## 2023-05-03 NOTE — ED Triage Notes (Signed)
The pt has frequent visits to this ed

## 2023-05-03 NOTE — Discharge Instructions (Signed)
 Return if any problems.

## 2023-05-12 ENCOUNTER — Other Ambulatory Visit: Payer: Self-pay

## 2023-05-12 ENCOUNTER — Emergency Department (HOSPITAL_COMMUNITY)
Admission: EM | Admit: 2023-05-12 | Discharge: 2023-05-12 | Disposition: A | Discharge status: Home / Self Care | Payer: MEDICAID | Attending: Emergency Medicine | Admitting: Emergency Medicine

## 2023-05-12 ENCOUNTER — Encounter (HOSPITAL_COMMUNITY): Payer: Self-pay | Admitting: *Deleted

## 2023-05-12 DIAGNOSIS — R309 Painful micturition, unspecified: Secondary | ICD-10-CM | POA: Diagnosis present

## 2023-05-12 DIAGNOSIS — R300 Dysuria: Secondary | ICD-10-CM | POA: Insufficient documentation

## 2023-05-12 LAB — URINALYSIS, W/ REFLEX TO CULTURE (INFECTION SUSPECTED)
Bilirubin Urine: NEGATIVE
Glucose, UA: NEGATIVE mg/dL
Hgb urine dipstick: NEGATIVE
Ketones, ur: NEGATIVE mg/dL
Leukocytes,Ua: NEGATIVE
Nitrite: NEGATIVE
Protein, ur: NEGATIVE mg/dL
Specific Gravity, Urine: >1.03 — ABNORMAL HIGH (ref 1.005–1.030)
pH: 6 (ref 5.0–8.0)

## 2023-05-12 LAB — PREGNANCY, URINE: Preg Test, Ur: NEGATIVE

## 2023-05-12 NOTE — ED Triage Notes (Signed)
 The pt  frequents the ed she reports that some medicine an antibiotic she was given is giving her problems and she is still having pain   she is requesting  a med change lmp jan 1st

## 2023-05-12 NOTE — Discharge Instructions (Signed)
 Follow up with your Physician for recheck.  Return if any problems.

## 2023-05-12 NOTE — ED Notes (Signed)
 Education and disposition material reviewed.  Follow up and antibiotic resistance education reinforced.

## 2023-05-12 NOTE — ED Provider Notes (Signed)
 [place] EMERGENCY DEPARTMENT AT [facility] Provider Note   CSN: [id number] Arrival date & time: 05/12/23  0110     History  Chief Complaint  Patient presents with   painful urination    Patty Mata is a 33 y.o. female.  Patient is concerned that she could have a urinary tract infection.  Patient reports she was recently treated for urinary tract infection but had difficulty tolerating the medication.  Patient is still concerned that she has infection.  Patient denies any fever or chills she denies any abdominal pain she is not have any chest pain patient denies any nausea or vomiting.  Patient has a past medical history of kidney stones and kidney infections.       Home Medications Prior to Admission medications   Medication Sig Start Date End Date Taking? Authorizing Provider  docusate sodium (COLACE) 100 MG capsule Take 1 capsule (100 mg total) by mouth every 12 (twelve) hours. 03/19/23   Roxy Horseman, PA-C  fluticasone (FLONASE) 50 MCG/ACT nasal spray Place 2 sprays into both nostrils daily. 05/06/22   Ward, Tylene Fantasia, PA-C  metroNIDAZOLE (FLAGYL) 500 MG tablet Take 1 tablet (500 mg total) by mouth 2 (two) times daily. 03/23/23   Achille Rich, PA-C  ondansetron (ZOFRAN-ODT) 4 MG disintegrating tablet Take 1 tablet (4 mg total) by mouth every 8 (eight) hours as needed for nausea or vomiting. 12/24/22   Waldon Merl, PA-C  polyethylene glycol powder (GLYCOLAX/MIRALAX) 17 GM/SCOOP powder Take 17 g by mouth 2 (two) times daily. Until daily soft stools OTC 03/19/23   Roxy Horseman, PA-C  amLODipine (NORVASC) 5 MG tablet Take 1 tablet (5 mg total) by mouth daily. Patient not taking: Reported on 12/05/2018 03/07/18 07/14/19  Tilda Burrow, MD  sertraline (ZOLOFT) 25 MG tablet Take 1 tablet (25 mg total) by mouth daily for 7 days. 11/01/19 12/14/19  Antonieta Pert, MD      Allergies    Penicillins    Review of Systems   Review of Systems   Gastrointestinal:  Positive for nausea.  All other systems reviewed and are negative.   Physical Exam Updated Vital Signs BP 120/80   Pulse 82   Temp 98 F (36.7 C)   Resp 18   Ht 5\' 1"  (1.549 m)   Wt 70.3 kg   LMP 04/06/2023   SpO2 99%   BMI 29.28 kg/m  Physical Exam Vitals and nursing note reviewed.  Constitutional:      Appearance: She is well-developed.  HENT:     Head: Normocephalic.  Cardiovascular:     Rate and Rhythm: Normal rate.  Pulmonary:     Effort: Pulmonary effort is normal.  Abdominal:     General: There is no distension.  Musculoskeletal:        General: Normal range of motion.     Cervical back: Normal range of motion.  Skin:    General: Skin is warm.  Neurological:     General: No focal deficit present.     Mental Status: She is alert and oriented to person, place, and time.    ED Results / Procedures / Treatments   Labs (all labs ordered are listed, but only abnormal results are displayed) Labs Reviewed  URINALYSIS, W/ REFLEX TO CULTURE (INFECTION SUSPECTED)  PREGNANCY, URINE    EKG None  Radiology No results found.  Procedures Procedures    Medications Ordered in ED Medications - No data to display  ED Course/  Medical Decision Making/ A&P                                 Medical Decision Making Patient complains of discomfort with urination.  Patient has a past medical history of urinary tract infections and  kidney stones.  Amount and/or Complexity of Data Reviewed Labs: ordered. Decision-making details documented in ED Course.    Details: Labs ordered reviewed and interpreted urine pregnancy is negative UA is negative  Risk Risk Details: Patient counseled on symptoms.  She is encouraged to drink plenty of fluids.  I do not see any evidence of urinary tract infection.  Patient advised to follow-up with primary care physician for recheck.           Final Clinical Impression(s) / ED Diagnoses Final diagnoses:   Dysuria    Rx / DC Orders ED Discharge Orders     None     An After Visit Summary was printed and given to the patient.     Elson Areas, PA-C 05/12/23 0836    Royanne Foots, DO 05/19/23 [phone]

## 2023-05-14 ENCOUNTER — Other Ambulatory Visit: Payer: Self-pay

## 2023-05-14 ENCOUNTER — Encounter (HOSPITAL_COMMUNITY): Payer: Self-pay | Admitting: Emergency Medicine

## 2023-05-14 ENCOUNTER — Emergency Department (HOSPITAL_COMMUNITY): Payer: MEDICAID

## 2023-05-14 ENCOUNTER — Emergency Department (HOSPITAL_COMMUNITY)
Admission: EM | Admit: 2023-05-14 | Discharge: 2023-05-14 | Disposition: A | Discharge status: Home / Self Care | Payer: MEDICAID | Attending: Emergency Medicine | Admitting: Emergency Medicine

## 2023-05-14 DIAGNOSIS — R1084 Generalized abdominal pain: Secondary | ICD-10-CM | POA: Diagnosis present

## 2023-05-14 LAB — CBC WITH DIFFERENTIAL/PLATELET
Abs Immature Granulocytes: 0.03 10*3/uL (ref 0.00–0.07)
Basophils Absolute: 0 10*3/uL (ref 0.0–0.1)
Basophils Relative: 0 %
Eosinophils Absolute: 0.1 10*3/uL (ref 0.0–0.5)
Eosinophils Relative: 1 %
HCT: 38.5 % (ref 36.0–46.0)
Hemoglobin: 12.5 g/dL (ref 12.0–15.0)
Immature Granulocytes: 0 %
Lymphocytes Relative: 36 %
Lymphs Abs: 2.8 10*3/uL (ref 0.7–4.0)
MCH: 30.6 pg (ref 26.0–34.0)
MCHC: 32.5 g/dL (ref 30.0–36.0)
MCV: 94.1 fL (ref 80.0–100.0)
Monocytes Absolute: 0.6 10*3/uL (ref 0.1–1.0)
Monocytes Relative: 8 %
Neutro Abs: 4.3 10*3/uL (ref 1.7–7.7)
Neutrophils Relative %: 55 %
Platelets: 226 10*3/uL (ref 150–400)
RBC: 4.09 MIL/uL (ref 3.87–5.11)
RDW: 12.1 % (ref 11.5–15.5)
WBC: 7.8 10*3/uL (ref 4.0–10.5)
nRBC: 0 % (ref 0.0–0.2)

## 2023-05-14 LAB — BASIC METABOLIC PANEL
Anion gap: 12 (ref 5–15)
BUN: 10 mg/dL (ref 6–20)
CO2: 24 mmol/L (ref 22–32)
Calcium: 9.1 mg/dL (ref 8.9–10.3)
Chloride: 98 mmol/L (ref 98–111)
Creatinine, Ser: 0.74 mg/dL (ref 0.44–1.00)
GFR, Estimated: >60 mL/min (ref >60)
Glucose, Bld: 104 mg/dL — ABNORMAL HIGH (ref 70–99)
Potassium: 3.6 mmol/L (ref 3.5–5.1)
Sodium: 134 mmol/L — ABNORMAL LOW (ref 135–145)

## 2023-05-14 LAB — URINALYSIS, ROUTINE W REFLEX MICROSCOPIC
Bilirubin Urine: NEGATIVE
Glucose, UA: NEGATIVE mg/dL
Ketones, ur: NEGATIVE mg/dL
Leukocytes,Ua: NEGATIVE
Nitrite: NEGATIVE
Protein, ur: NEGATIVE mg/dL
Specific Gravity, Urine: 1.015 (ref 1.005–1.030)
pH: 6 (ref 5.0–8.0)

## 2023-05-14 LAB — PREGNANCY, URINE: Preg Test, Ur: NEGATIVE

## 2023-05-14 MED ORDER — ACETAMINOPHEN 500 MG PO TABS: 1000.0000 mg | ORAL_TABLET | Freq: Once | ORAL | Status: DC

## 2023-05-14 MED ORDER — KETOROLAC TROMETHAMINE 15 MG/ML IJ SOLN
15.0000 mg | Freq: Once | INTRAMUSCULAR | Status: DC
  Filled 2023-05-14: qty 1

## 2023-05-14 NOTE — ED Notes (Signed)
Pt refused regular CT

## 2023-05-14 NOTE — ED Notes (Signed)
Pt refused tordal.

## 2023-05-14 NOTE — Discharge Instructions (Signed)
You have chosen to leave, this is not recommended.  I recommend that you get the CAT scan, but you declined, please follow-up with your primary care doctor, return if you feel like your symptoms are worsening

## 2023-05-14 NOTE — ED Triage Notes (Signed)
  Patient comes in with abdominal pain and burning around belly button that has been going on for about a week.  Patient states she was diagnosed with UTI on 2/1 and given antibiotics.  States they were making her sick so she stopped after about 2 days worth.  Patient was seen yesterday for same symptoms and urine did not show UTI.  Patient states she has hx of frequent UTIs and kidney stones.  Pain 6/10, sharp/stabbing in R side of abdomen.  Denies any dysuria.  No fevers at home.

## 2023-05-14 NOTE — ED Notes (Signed)
Pt refused iv

## 2023-05-14 NOTE — ED Provider Notes (Signed)
[place] EMERGENCY DEPARTMENT AT [facility] Provider Note   CSN: [id number] Arrival date & time: 05/14/23  0003     History  Chief Complaint  Patient presents with   Abdominal Pain    Patty Mata is a 33 y.o. female,, no pertinent past medical history, here for abdominal pain, has been going on for the last week.  She states she has abdominal pain radiating to her right flank, it has been going on for the last week.  She states the pain in her right flank, stabbing, but she has burning pain around her lower abdomen.  She notes she was diagnosed with a urinary tract infection, about a week ago, and only took 2 days worth of antibiotics, and stopped taking in the antibiotics, because it made her nauseated.  She reports she has a history of frequent UTIs and kidney stones.  Not have any fevers or chills.  Denies any trauma.    Home Medications Prior to Admission medications   Medication Sig Start Date End Date Taking? Authorizing Provider  docusate sodium (COLACE) 100 MG capsule Take 1 capsule (100 mg total) by mouth every 12 (twelve) hours. 03/19/23   Roxy Horseman, PA-C  fluticasone (FLONASE) 50 MCG/ACT nasal spray Place 2 sprays into both nostrils daily. 05/06/22   Ward, Tylene Fantasia, PA-C  metroNIDAZOLE (FLAGYL) 500 MG tablet Take 1 tablet (500 mg total) by mouth 2 (two) times daily. 03/23/23   Achille Rich, PA-C  ondansetron (ZOFRAN-ODT) 4 MG disintegrating tablet Take 1 tablet (4 mg total) by mouth every 8 (eight) hours as needed for nausea or vomiting. 12/24/22   Waldon Merl, PA-C  polyethylene glycol powder (GLYCOLAX/MIRALAX) 17 GM/SCOOP powder Take 17 g by mouth 2 (two) times daily. Until daily soft stools OTC 03/19/23   Roxy Horseman, PA-C  amLODipine (NORVASC) 5 MG tablet Take 1 tablet (5 mg total) by mouth daily. Patient not taking: Reported on 12/05/2018 03/07/18 07/14/19  Tilda Burrow, MD  sertraline (ZOLOFT) 25 MG tablet Take 1 tablet (25 mg total) by  mouth daily for 7 days. 11/01/19 12/14/19  Antonieta Pert, MD      Allergies    Penicillins    Review of Systems   Review of Systems  Gastrointestinal:  Positive for abdominal pain. Negative for diarrhea, nausea and vomiting.    Physical Exam Updated Vital Signs BP (!) 145/87 (BP Location: Left Arm)   Pulse 88   Temp 98.5 F (36.9 C) (Oral)   Resp 15   Ht 5\' 1"  (1.549 m)   Wt 69.9 kg   LMP 05/03/2023 (Approximate)   SpO2 100%   BMI 29.10 kg/m  Physical Exam Vitals and nursing note reviewed.  Constitutional:      General: She is not in acute distress.    Appearance: She is well-developed.  HENT:     Head: Normocephalic and atraumatic.  Eyes:     Conjunctiva/sclera: Conjunctivae normal.  Cardiovascular:     Rate and Rhythm: Normal rate and regular rhythm.     Heart sounds: No murmur heard. Pulmonary:     Effort: Pulmonary effort is normal. No respiratory distress.     Breath sounds: Normal breath sounds.  Abdominal:     Palpations: Abdomen is soft.     Tenderness: There is abdominal tenderness in the periumbilical area and suprapubic area. There is right CVA tenderness.  Musculoskeletal:        General: No swelling.     Cervical back:  Neck supple.  Skin:    General: Skin is warm and dry.     Capillary Refill: Capillary refill takes less than 2 seconds.  Neurological:     Mental Status: She is alert.  Psychiatric:        Mood and Affect: Mood normal.     ED Results / Procedures / Treatments   Labs (all labs ordered are listed, but only abnormal results are displayed) Labs Reviewed  URINALYSIS, ROUTINE W REFLEX MICROSCOPIC - Abnormal; Notable for the following components:      Result Value   APPearance HAZY (*)    Hgb urine dipstick LARGE (*)    Bacteria, UA RARE (*)    All other components within normal limits  BASIC METABOLIC PANEL - Abnormal; Notable for the following components:   Sodium 134 (*)    Glucose, Bld 104 (*)    All other components  within normal limits  CBC WITH DIFFERENTIAL/PLATELET  PREGNANCY, URINE    EKG None  Radiology No results found.  Procedures Procedures    Medications Ordered in ED Medications - No data to display  ED Course/ Medical Decision Making/ A&P                                 Medical Decision Making Patient is a 33 year old female, here for right flank pain, and right middle abdominal pain, this been going on for the last few days.  She was treated for urinary tract infection but stopped taking her antibiotics.  She denies any dysuria, concern for STDs, not having any pelvic pain.  States that the burning is more in her lower abdomen, as well as her right flank.  Has history of kidney stones and UTIs.  Initially offered CT abdomen pelvis, which she declined with contrast, we will obtain a CT renal, to evaluate for any kind of stone, or masses noted.  She was in agreement this, her adamantly refused any pain medications.  Amount and/or Complexity of Data Reviewed Labs: ordered.    Details: Urinalysis fairly unremarkable except for large amount of hemoglobin and urine Radiology: ordered. Discussion of management or test interpretation with external provider(s): Reevaluated patient and she is refusing all pain medications, she has a large amount of hemoglobin in her urine, but no other findings.  I recommended a CT scan, to evaluate for possible kidney stone, and she is now declining, discharged home with strict return precautions, and recommended CT scan, which patient adamantly declined.  She should follow-up with her primary care doctor, for further evaluation.  I also recommend that we swab for STDs, which she declined, and states that is not a risk    Final Clinical Impression(s) / ED Diagnoses Final diagnoses:  Generalized abdominal pain    Rx / DC Orders ED Discharge Orders     None         Amritha Yorke, Harley [place], PA 05/14/23 0604    Dione Booze, MD 05/14/23 [phone]

## 2023-05-21 ENCOUNTER — Emergency Department (HOSPITAL_COMMUNITY)
Admission: EM | Admit: 2023-05-21 | Discharge: 2023-05-21 | Discharge status: Against Medical Advice | Payer: MEDICAID | Attending: Student | Admitting: Student

## 2023-05-21 ENCOUNTER — Other Ambulatory Visit: Payer: Self-pay

## 2023-05-21 DIAGNOSIS — Z5321 Procedure and treatment not carried out due to patient leaving prior to being seen by health care provider: Secondary | ICD-10-CM | POA: Diagnosis not present

## 2023-05-21 DIAGNOSIS — M7989 Other specified soft tissue disorders: Secondary | ICD-10-CM | POA: Diagnosis present

## 2023-05-21 DIAGNOSIS — R600 Localized edema: Secondary | ICD-10-CM | POA: Insufficient documentation

## 2023-05-21 LAB — COMPREHENSIVE METABOLIC PANEL
ALT: 20 U/L (ref 0–44)
AST: 21 U/L (ref 15–41)
Albumin: 3.8 g/dL (ref 3.5–5.0)
Alkaline Phosphatase: 72 U/L (ref 38–126)
Anion gap: 8 (ref 5–15)
BUN: 16 mg/dL (ref 6–20)
CO2: 27 mmol/L (ref 22–32)
Calcium: 9.1 mg/dL (ref 8.9–10.3)
Chloride: 101 mmol/L (ref 98–111)
Creatinine, Ser: 0.74 mg/dL (ref 0.44–1.00)
GFR, Estimated: >60 mL/min (ref >60)
Glucose, Bld: 111 mg/dL — ABNORMAL HIGH (ref 70–99)
Potassium: 4.1 mmol/L (ref 3.5–5.1)
Sodium: 136 mmol/L (ref 135–145)
Total Bilirubin: 0.5 mg/dL (ref 0.0–1.2)
Total Protein: 7.2 g/dL (ref 6.5–8.1)

## 2023-05-21 LAB — CBC WITH DIFFERENTIAL/PLATELET
Abs Immature Granulocytes: 0.02 10*3/uL (ref 0.00–0.07)
Basophils Absolute: 0 10*3/uL (ref 0.0–0.1)
Basophils Relative: 1 %
Eosinophils Absolute: 0.3 10*3/uL (ref 0.0–0.5)
Eosinophils Relative: 5 %
HCT: 38.3 % (ref 36.0–46.0)
Hemoglobin: 12 g/dL (ref 12.0–15.0)
Immature Granulocytes: 0 %
Lymphocytes Relative: 44 %
Lymphs Abs: 2.4 10*3/uL (ref 0.7–4.0)
MCH: 30 pg (ref 26.0–34.0)
MCHC: 31.3 g/dL (ref 30.0–36.0)
MCV: 95.8 fL (ref 80.0–100.0)
Monocytes Absolute: 0.5 10*3/uL (ref 0.1–1.0)
Monocytes Relative: 8 %
Neutro Abs: 2.4 10*3/uL (ref 1.7–7.7)
Neutrophils Relative %: 42 %
Platelets: 188 10*3/uL (ref 150–400)
RBC: 4 MIL/uL (ref 3.87–5.11)
RDW: 12 % (ref 11.5–15.5)
WBC: 5.6 10*3/uL (ref 4.0–10.5)
nRBC: 0 % (ref 0.0–0.2)

## 2023-05-21 LAB — HCG, SERUM, QUALITATIVE: Preg, Serum: NEGATIVE

## 2023-05-21 NOTE — ED Notes (Signed)
 Call pt 3 times in lobby for vital recheck and had no response from pt.

## 2023-05-21 NOTE — ED Triage Notes (Signed)
 Patient reports persistent edema/swelling at bilateral lower legs for several weeks , respirations unlabored / ambulatory .

## 2023-05-21 NOTE — ED Notes (Signed)
Pt called for vitals no response x3 ° °

## 2023-05-29 ENCOUNTER — Encounter (HOSPITAL_COMMUNITY): Payer: Self-pay | Admitting: *Deleted

## 2023-05-29 ENCOUNTER — Emergency Department (HOSPITAL_COMMUNITY)
Admission: EM | Admit: 2023-05-29 | Discharge: 2023-05-29 | Discharge status: Against Medical Advice | Payer: MEDICAID | Attending: Emergency Medicine | Admitting: Emergency Medicine

## 2023-05-29 ENCOUNTER — Other Ambulatory Visit: Payer: Self-pay

## 2023-05-29 DIAGNOSIS — Z5900 Homelessness unspecified: Secondary | ICD-10-CM | POA: Insufficient documentation

## 2023-05-29 DIAGNOSIS — R1310 Dysphagia, unspecified: Secondary | ICD-10-CM | POA: Insufficient documentation

## 2023-05-29 DIAGNOSIS — Z5321 Procedure and treatment not carried out due to patient leaving prior to being seen by health care provider: Secondary | ICD-10-CM | POA: Diagnosis not present

## 2023-05-29 NOTE — ED Triage Notes (Signed)
 The pt is c/o something in her lt jaw she reports that it is hard to swallow  she is homeless and is a frequent  pt at night  lmpfen 1st

## 2023-05-29 NOTE — ED Notes (Signed)
 Pt called for vitals, no response. No sight of pt in lobby

## 2023-10-30 ENCOUNTER — Ambulatory Visit: Payer: BLUE CROSS/BLUE SHIELD | Attending: Gastroenterology
